# Patient Record
Sex: Female | Born: 1965 | Race: White | Hispanic: No | Marital: Married | Smoking: Former smoker
Health system: Southern US, Community
[De-identification: ages and names within clinical notes are randomized; demographics above are authoritative.]

## PROBLEM LIST (undated history)

## (undated) DIAGNOSIS — C73 Malignant neoplasm of thyroid gland: Secondary | ICD-10-CM

## (undated) DIAGNOSIS — J309 Allergic rhinitis, unspecified: Secondary | ICD-10-CM

## (undated) DIAGNOSIS — F411 Generalized anxiety disorder: Secondary | ICD-10-CM

## (undated) DIAGNOSIS — I10 Essential (primary) hypertension: Secondary | ICD-10-CM

## (undated) DIAGNOSIS — E039 Hypothyroidism, unspecified: Secondary | ICD-10-CM

## (undated) DIAGNOSIS — E785 Hyperlipidemia, unspecified: Secondary | ICD-10-CM

## (undated) DIAGNOSIS — D649 Anemia, unspecified: Secondary | ICD-10-CM

## (undated) DIAGNOSIS — F329 Major depressive disorder, single episode, unspecified: Secondary | ICD-10-CM

## (undated) DIAGNOSIS — D219 Benign neoplasm of connective and other soft tissue, unspecified: Secondary | ICD-10-CM

## (undated) DIAGNOSIS — N904 Leukoplakia of vulva: Secondary | ICD-10-CM

## (undated) DIAGNOSIS — F3289 Other specified depressive episodes: Secondary | ICD-10-CM

## (undated) HISTORY — DX: Hyperlipidemia, unspecified: E78.5

## (undated) HISTORY — PX: TUBAL LIGATION: SHX77

## (undated) HISTORY — DX: Essential (primary) hypertension: I10

## (undated) HISTORY — DX: Leukoplakia of vulva: N90.4

## (undated) HISTORY — DX: Generalized anxiety disorder: F41.1

## (undated) HISTORY — DX: Other specified depressive episodes: F32.89

## (undated) HISTORY — DX: Major depressive disorder, single episode, unspecified: F32.9

## (undated) HISTORY — DX: Allergic rhinitis, unspecified: J30.9

## (undated) HISTORY — PX: TOTAL THYROIDECTOMY: SHX2547

## (undated) HISTORY — PX: WISDOM TOOTH EXTRACTION: SHX21

---

## 1998-10-03 ENCOUNTER — Other Ambulatory Visit: Admission: RE | Admit: 1998-10-03 | Discharge: 1998-10-03 | Payer: Self-pay | Admitting: *Deleted

## 2001-05-05 ENCOUNTER — Inpatient Hospital Stay (HOSPITAL_COMMUNITY): Admission: AD | Admit: 2001-05-05 | Discharge: 2001-05-09 | Payer: Self-pay | Admitting: *Deleted

## 2001-05-05 ENCOUNTER — Encounter (INDEPENDENT_AMBULATORY_CARE_PROVIDER_SITE_OTHER): Payer: Self-pay

## 2001-05-11 ENCOUNTER — Inpatient Hospital Stay (HOSPITAL_COMMUNITY): Admission: AD | Admit: 2001-05-11 | Discharge: 2001-05-11 | Payer: Self-pay | Admitting: Obstetrics and Gynecology

## 2002-02-10 ENCOUNTER — Other Ambulatory Visit: Admission: RE | Admit: 2002-02-10 | Discharge: 2002-02-10 | Payer: Self-pay | Admitting: Obstetrics and Gynecology

## 2003-03-01 ENCOUNTER — Other Ambulatory Visit: Admission: RE | Admit: 2003-03-01 | Discharge: 2003-03-01 | Payer: Self-pay | Admitting: Obstetrics and Gynecology

## 2004-03-03 ENCOUNTER — Other Ambulatory Visit: Admission: RE | Admit: 2004-03-03 | Discharge: 2004-03-03 | Payer: Self-pay | Admitting: Obstetrics and Gynecology

## 2004-03-04 ENCOUNTER — Other Ambulatory Visit: Admission: RE | Admit: 2004-03-04 | Discharge: 2004-03-04 | Payer: Self-pay | Admitting: Obstetrics and Gynecology

## 2004-12-24 ENCOUNTER — Ambulatory Visit: Payer: Self-pay | Admitting: Internal Medicine

## 2005-04-16 ENCOUNTER — Other Ambulatory Visit: Admission: RE | Admit: 2005-04-16 | Discharge: 2005-04-16 | Payer: Self-pay | Admitting: Obstetrics and Gynecology

## 2005-08-25 ENCOUNTER — Other Ambulatory Visit: Admission: RE | Admit: 2005-08-25 | Discharge: 2005-08-25 | Payer: Self-pay | Admitting: Obstetrics and Gynecology

## 2006-03-10 ENCOUNTER — Ambulatory Visit: Payer: Self-pay | Admitting: Internal Medicine

## 2007-01-24 ENCOUNTER — Encounter: Admission: RE | Admit: 2007-01-24 | Discharge: 2007-01-24 | Payer: Self-pay | Admitting: Obstetrics and Gynecology

## 2007-03-10 ENCOUNTER — Ambulatory Visit: Payer: Self-pay | Admitting: Internal Medicine

## 2007-03-10 LAB — CONVERTED CEMR LAB
Albumin: 3.8 g/dL (ref 3.5–5.2)
BUN: 11 mg/dL (ref 6–23)
Bilirubin, Direct: 0.1 mg/dL (ref 0.0–0.3)
Calcium: 9.3 mg/dL (ref 8.4–10.5)
Chloride: 106 meq/L (ref 96–112)
Crystals: NEGATIVE
Direct LDL: 131 mg/dL
GFR calc Af Amer: 143 mL/min
Glucose, Bld: 94 mg/dL (ref 70–99)
HDL: 53.6 mg/dL (ref >39.0)
Lymphocytes Relative: 35 % (ref 12.0–46.0)
Monocytes Absolute: 0.6 10*3/uL (ref 0.2–0.7)
Monocytes Relative: 8.8 % (ref 3.0–11.0)
Neutrophils Relative %: 53.8 % (ref 43.0–77.0)
Nitrite: NEGATIVE
Potassium: 4.2 meq/L (ref 3.5–5.1)
RBC: 4.93 M/uL (ref 3.87–5.11)
RDW: 11.6 % (ref 11.5–14.6)
Sodium: 142 meq/L (ref 135–145)
Specific Gravity, Urine: 1.01 (ref 1.000–1.03)
Total Bilirubin: 0.7 mg/dL (ref 0.3–1.2)
Total CHOL/HDL Ratio: 3.8
Total Protein, Urine: NEGATIVE mg/dL
Urobilinogen, UA: 0.2 (ref 0.0–1.0)
VLDL: 16 mg/dL (ref 0–40)

## 2008-02-29 ENCOUNTER — Encounter: Admission: RE | Admit: 2008-02-29 | Discharge: 2008-02-29 | Payer: Self-pay | Admitting: Obstetrics and Gynecology

## 2008-03-07 ENCOUNTER — Encounter: Admission: RE | Admit: 2008-03-07 | Discharge: 2008-03-07 | Payer: Self-pay | Admitting: Obstetrics and Gynecology

## 2008-04-03 ENCOUNTER — Encounter: Payer: Self-pay | Admitting: Internal Medicine

## 2008-04-12 ENCOUNTER — Telehealth (INDEPENDENT_AMBULATORY_CARE_PROVIDER_SITE_OTHER): Payer: Self-pay | Admitting: *Deleted

## 2008-07-18 ENCOUNTER — Ambulatory Visit: Payer: Self-pay | Admitting: Internal Medicine

## 2008-07-18 DIAGNOSIS — J309 Allergic rhinitis, unspecified: Secondary | ICD-10-CM | POA: Insufficient documentation

## 2008-07-18 DIAGNOSIS — F329 Major depressive disorder, single episode, unspecified: Secondary | ICD-10-CM

## 2008-07-18 DIAGNOSIS — F3289 Other specified depressive episodes: Secondary | ICD-10-CM | POA: Insufficient documentation

## 2008-07-18 DIAGNOSIS — F411 Generalized anxiety disorder: Secondary | ICD-10-CM | POA: Insufficient documentation

## 2008-07-18 DIAGNOSIS — R109 Unspecified abdominal pain: Secondary | ICD-10-CM | POA: Insufficient documentation

## 2008-07-18 DIAGNOSIS — E785 Hyperlipidemia, unspecified: Secondary | ICD-10-CM

## 2008-07-18 LAB — CONVERTED CEMR LAB
ALT: 19 units/L (ref 0–35)
AST: 19 units/L (ref 0–37)
Albumin: 3.7 g/dL (ref 3.5–5.2)
Alkaline Phosphatase: 82 units/L (ref 39–117)
BUN: 9 mg/dL (ref 6–23)
Bacteria, UA: NEGATIVE
Basophils Absolute: 0 10*3/uL (ref 0.0–0.1)
Bilirubin, Direct: 0.1 mg/dL (ref 0.0–0.3)
Chloride: 105 meq/L (ref 96–112)
Eosinophils Absolute: 0.1 10*3/uL (ref 0.0–0.7)
H Pylori IgG: NEGATIVE
HCT: 43.5 % (ref 36.0–46.0)
Ketones, ur: NEGATIVE mg/dL
MCHC: 36 g/dL (ref 30.0–36.0)
Mucus, UA: NEGATIVE
Neutrophils Relative %: 67.5 % (ref 43.0–77.0)
Platelets: 399 10*3/uL (ref 150–400)
Potassium: 4.4 meq/L (ref 3.5–5.1)
RDW: 11.8 % (ref 11.5–14.6)
Sodium: 141 meq/L (ref 135–145)
Specific Gravity, Urine: 1.01 (ref 1.000–1.03)
Total Protein: 7 g/dL (ref 6.0–8.3)
Urine Glucose: NEGATIVE mg/dL
Urobilinogen, UA: 0.2 (ref 0.0–1.0)
pH: 6 (ref 5.0–8.0)

## 2008-07-30 ENCOUNTER — Encounter (INDEPENDENT_AMBULATORY_CARE_PROVIDER_SITE_OTHER): Payer: Self-pay | Admitting: *Deleted

## 2008-07-30 ENCOUNTER — Ambulatory Visit: Payer: Self-pay | Admitting: Internal Medicine

## 2008-07-30 DIAGNOSIS — R209 Unspecified disturbances of skin sensation: Secondary | ICD-10-CM | POA: Insufficient documentation

## 2008-08-08 ENCOUNTER — Encounter: Admission: RE | Admit: 2008-08-08 | Discharge: 2008-08-08 | Payer: Self-pay | Admitting: Internal Medicine

## 2008-08-21 ENCOUNTER — Telehealth (INDEPENDENT_AMBULATORY_CARE_PROVIDER_SITE_OTHER): Payer: Self-pay | Admitting: *Deleted

## 2008-08-21 DIAGNOSIS — IMO0002 Reserved for concepts with insufficient information to code with codable children: Secondary | ICD-10-CM | POA: Insufficient documentation

## 2008-08-24 ENCOUNTER — Encounter: Payer: Self-pay | Admitting: Internal Medicine

## 2008-08-24 ENCOUNTER — Ambulatory Visit: Payer: Self-pay | Admitting: Internal Medicine

## 2008-08-24 DIAGNOSIS — S6730XA Crushing injury of unspecified wrist, initial encounter: Secondary | ICD-10-CM | POA: Insufficient documentation

## 2008-08-24 DIAGNOSIS — S52539A Colles' fracture of unspecified radius, initial encounter for closed fracture: Secondary | ICD-10-CM | POA: Insufficient documentation

## 2008-08-28 ENCOUNTER — Encounter: Payer: Self-pay | Admitting: Internal Medicine

## 2008-08-29 ENCOUNTER — Encounter: Admission: RE | Admit: 2008-08-29 | Discharge: 2008-08-29 | Payer: Self-pay | Admitting: Internal Medicine

## 2008-08-31 ENCOUNTER — Telehealth (INDEPENDENT_AMBULATORY_CARE_PROVIDER_SITE_OTHER): Payer: Self-pay | Admitting: *Deleted

## 2008-09-03 ENCOUNTER — Telehealth (INDEPENDENT_AMBULATORY_CARE_PROVIDER_SITE_OTHER): Payer: Self-pay | Admitting: *Deleted

## 2008-09-11 ENCOUNTER — Encounter: Payer: Self-pay | Admitting: Internal Medicine

## 2008-10-25 ENCOUNTER — Encounter: Payer: Self-pay | Admitting: Internal Medicine

## 2009-03-04 ENCOUNTER — Encounter: Admission: RE | Admit: 2009-03-04 | Discharge: 2009-03-04 | Payer: Self-pay | Admitting: Obstetrics and Gynecology

## 2009-04-17 ENCOUNTER — Telehealth (INDEPENDENT_AMBULATORY_CARE_PROVIDER_SITE_OTHER): Payer: Self-pay | Admitting: *Deleted

## 2009-06-26 ENCOUNTER — Ambulatory Visit: Payer: Self-pay | Admitting: Internal Medicine

## 2009-06-26 DIAGNOSIS — M549 Dorsalgia, unspecified: Secondary | ICD-10-CM | POA: Insufficient documentation

## 2009-07-10 ENCOUNTER — Ambulatory Visit: Payer: Self-pay | Admitting: Internal Medicine

## 2009-07-10 ENCOUNTER — Telehealth: Payer: Self-pay | Admitting: Internal Medicine

## 2009-11-14 ENCOUNTER — Telehealth: Payer: Self-pay | Admitting: Internal Medicine

## 2010-01-16 ENCOUNTER — Ambulatory Visit: Payer: Self-pay | Admitting: Internal Medicine

## 2010-01-16 LAB — CONVERTED CEMR LAB
ALT: 18 units/L (ref 0–35)
Albumin: 3.8 g/dL (ref 3.5–5.2)
BUN: 9 mg/dL (ref 6–23)
Basophils Absolute: 0 10*3/uL (ref 0.0–0.1)
Eosinophils Absolute: 0.1 10*3/uL (ref 0.0–0.7)
Eosinophils Relative: 1.1 % (ref 0.0–5.0)
HCT: 44.1 % (ref 36.0–46.0)
Hemoglobin, Urine: NEGATIVE
Hemoglobin: 14.8 g/dL (ref 12.0–15.0)
LDL Cholesterol: 121 mg/dL — ABNORMAL HIGH (ref 0–99)
Lymphocytes Relative: 28.1 % (ref 12.0–46.0)
MCV: 96.2 fL (ref 78.0–100.0)
Nitrite: NEGATIVE
Potassium: 4.1 meq/L (ref 3.5–5.1)
RBC: 4.58 M/uL (ref 3.87–5.11)
RDW: 11.6 % (ref 11.5–14.6)
Sodium: 140 meq/L (ref 135–145)
Specific Gravity, Urine: 1.025 (ref 1.000–1.030)
Total Bilirubin: 0.4 mg/dL (ref 0.3–1.2)
Total CHOL/HDL Ratio: 3
Total Protein, Urine: NEGATIVE mg/dL
Total Protein: 7.7 g/dL (ref 6.0–8.3)
Triglycerides: 43 mg/dL (ref 0.0–149.0)
Urobilinogen, UA: 0.2 (ref 0.0–1.0)
WBC: 6.1 10*3/uL (ref 4.5–10.5)

## 2010-02-14 LAB — HM MAMMOGRAPHY: HM Mammogram: NORMAL

## 2010-03-11 ENCOUNTER — Encounter: Admission: RE | Admit: 2010-03-11 | Discharge: 2010-03-11 | Payer: Self-pay | Admitting: Obstetrics and Gynecology

## 2010-03-28 ENCOUNTER — Ambulatory Visit: Payer: Self-pay | Admitting: Internal Medicine

## 2010-06-12 ENCOUNTER — Telehealth: Payer: Self-pay | Admitting: Internal Medicine

## 2010-07-11 ENCOUNTER — Telehealth: Payer: Self-pay | Admitting: Internal Medicine

## 2010-09-22 ENCOUNTER — Telehealth: Payer: Self-pay | Admitting: Internal Medicine

## 2010-09-24 ENCOUNTER — Telehealth: Payer: Self-pay | Admitting: Internal Medicine

## 2010-11-06 ENCOUNTER — Ambulatory Visit: Payer: Self-pay | Admitting: Internal Medicine

## 2010-12-18 NOTE — Progress Notes (Signed)
Summary: med refill  Phone Note Refill Request Message from:  Fax from Pharmacy on September 22, 2010 2:47 PM  Refills Requested: Medication #1:  ALPRAZOLAM 0.25 MG  TABS 1 by mouth two times a day prn   Dosage confirmed as above?Dosage Confirmed   Last Refilled: 07/11/2010   Notes: [facility] [phone] Initial call taken by: Zella Ball Ewing CMA Duncan Dull),  September 22, 2010 2:48 PM    New/Updated Medications: ALPRAZOLAM 0.25 MG  TABS (ALPRAZOLAM) 1 by mouth two times a day as needed Prescriptions: ALPRAZOLAM 0.25 MG  TABS (ALPRAZOLAM) 1 by mouth two times a day as needed  #60 x 2   Entered and Authorized by:   Corwin Levins MD   Signed by:   Corwin Levins MD on 09/22/2010   Method used:   Print then Give to Patient   RxID:   [id number]  done hardcopy to LIM side B - dahlia  Corwin Levins MD  September 22, 2010 5:30 PM   Rx faxed to pharmacy Margaret Pyle, CMA  September 23, 2010 8:14 AM

## 2010-12-18 NOTE — Progress Notes (Signed)
  Phone Note Refill Request Message from:  Fax from Pharmacy on June 12, 2010 1:23 PM  Refills Requested: Medication #1:  HYDROCODONE-ACETAMINOPHEN 7.5-325 MG TABS 1po once daily as needed.   Dosage confirmed as above?Dosage Confirmed   Last Refilled: 03/27/2010   Notes: [facility], [phone] Initial call taken by: Robin Ewing CMA Duncan Dull),  June 12, 2010 1:25 PM  Follow-up for Phone Call        done hardcopy to LIM side B - dahlia  Follow-up by: Corwin Levins MD,  June 12, 2010 3:57 PM  Additional Follow-up for Phone Call Additional follow up Details #1::        Rx faxed to pharmacy Additional Follow-up by: Margaret Pyle, CMA,  June 12, 2010 4:05 PM    New/Updated Medications: HYDROCODONE-ACETAMINOPHEN 7.5-325 MG TABS (HYDROCODONE-ACETAMINOPHEN) 1po once daily as needed Prescriptions: HYDROCODONE-ACETAMINOPHEN 7.5-325 MG TABS (HYDROCODONE-ACETAMINOPHEN) 1po once daily as needed  #30 x 1   Entered and Authorized by:   Corwin Levins MD   Signed by:   Corwin Levins MD on 06/12/2010   Method used:   Print then Give to Patient   RxID:   [id number]

## 2010-12-18 NOTE — Progress Notes (Signed)
Summary: Hydrocodone  Phone Note Refill Request Message from:  Patient on September 24, 2010 11:34 AM  Refills Requested: Medication #1:  HYDROCODONE-ACETAMINOPHEN 7.5-325 MG TABS 1po once daily as needed.   Dosage confirmed as above?Dosage Confirmed   Supply Requested: 1 month To [facility]   Method Requested: Fax to Local Pharmacy Initial call taken by: Margaret Pyle, CMA,  September 24, 2010 11:34 AM    New/Updated Medications: HYDROCODONE-ACETAMINOPHEN 7.5-325 MG TABS (HYDROCODONE-ACETAMINOPHEN) 1po once daily as needed Prescriptions: HYDROCODONE-ACETAMINOPHEN 7.5-325 MG TABS (HYDROCODONE-ACETAMINOPHEN) 1po once daily as needed  #30 x 2   Entered and Authorized by:   Corwin Levins MD   Signed by:   Corwin Levins MD on 09/24/2010   Method used:   Print then Give to Patient   RxID:   [id number]  done hardcopy to LIM side B - dahlia Corwin Levins MD  September 24, 2010 4:59 PM   Rx faxed to pharmacy Margaret Pyle, CMA  September 25, 2010 8:03 AM

## 2010-12-18 NOTE — Progress Notes (Signed)
Summary: Rx refill req  Phone Note Call from Patient Call back at Home Phone [phone]   Caller: Patient [phone] Summary of Call: Pt called requesting refill of pain medication. Pt states that she had Rx filled at [facility] in [place] and but was told that she could not transfer refill to [facility]. This was verified by [facility]. Initial call taken by: Margaret Pyle, CMA,  July 11, 2010 11:54 AM  Follow-up for Phone Call        done hardcopy to LIM side B - dahlia  Follow-up by: Corwin Levins MD,  July 11, 2010 1:17 PM  Additional Follow-up for Phone Call Additional follow up Details #1::        Pt informed via VM, Rx faxed to pharmacy Additional Follow-up by: Margaret Pyle, CMA,  July 11, 2010 1:24 PM    Prescriptions: HYDROCODONE-ACETAMINOPHEN 7.5-325 MG TABS (HYDROCODONE-ACETAMINOPHEN) 1po once daily as needed  #30 x 1   Entered and Authorized by:   Corwin Levins MD   Signed by:   Corwin Levins MD on 07/11/2010   Method used:   Print then Give to Patient   RxID:   [id number]

## 2010-12-18 NOTE — Assessment & Plan Note (Signed)
Summary: CPX/ CK THYROID/ TIRED/NWS  #   Vital Signs:  Patient profile:   45 year old female Height:      64.5 inches Weight:      156 pounds BMI:     26.46 O2 Sat:      99 % on Room air Temp:     98.0 degrees F oral Pulse rate:   85 / minute Pulse rhythm:   regular BP sitting:   140 / 88  (left arm) Cuff size:   large  Vitals Entered By: Rock Nephew CMA (Mar 28, 2010 1:14 PM)  O2 Flow:  Room air  Preventive Care Screening  Last Tetanus Booster:    Date:  03/28/2010    Results:  Tdap  Mammogram:    Date:  02/14/2010    Results:  normal   Pap Smear:    Date:  12/17/2009    Results:  normal    History of Present Illness: overall doing well, cannot take oxycodone due to GI intolerance;  Pt denies CP, sob, doe, wheezing, orthopnea, pnd, worsening LE edema, palps, dizziness or syncope   Pt denies new neuro symptoms such as headache, facial or extremity weakness  Overal back pain stable wihtout increaed pain or LE pain, sweling or numbness.    Problems Prior to Update: 1)  Back Pain  (ICD-724.5) 2)  Closed Colles Fracture  (ICD-813.41) 3)  Crushing Injury, Wrist  (ICD-927.21) 4)  Lumbar Radiculopathy, Right  (ICD-724.4) 5)  Paresthesia  (ICD-782.0) 6)  Hyperlipidemia  (ICD-272.4) 7)  Allergic Rhinitis  (ICD-477.9) 8)  Depression  (ICD-311) 9)  Anxiety  (ICD-300.00) 10)  Abdominal Pain, Unspecified Site  (ICD-789.00)  Medications Prior to Update: 1)  Alprazolam 0.25 Mg  Tabs (Alprazolam) .Marland Kitchen.. 1 By Mouth Two Times A Day Prn 2)  Pantoprazole Sodium 40 Mg Tbec (Pantoprazole Sodium) .... Take 1 Tablet By Mouth Once A Day 3)  Darvocet-N 100 100-650 Mg Tabs (Propoxyphene N-Apap) .Marland Kitchen.. 1 By Mouth Q 6 Hrs As Needed 4)  Prednisone 1 Mg Tabs (Prednisone) .... Use Asd Per Er 5)  Oxycodone-Acetaminophen 5-325 Mg Tabs (Oxycodone-Acetaminophen) .Marland Kitchen.. 1 By Mouth Q 6 Hrs As Needed Pain 6)  Carisoprodol 350 Mg Tabs (Carisoprodol) .Marland Kitchen.. 1 By Mouth Q 6 Hrs As Needed  Current  Medications (verified): 1)  Alprazolam 0.25 Mg  Tabs (Alprazolam) .Marland Kitchen.. 1 By Mouth Two Times A Day Prn 2)  Omeprazole 20 Mg Cpdr (Omeprazole) .Marland Kitchen.. 1po Once Daily 3)  Carisoprodol 350 Mg Tabs (Carisoprodol) .Marland Kitchen.. 1 By Mouth Q 6 Hrs As Needed 4)  Hydrocodone-Acetaminophen 7.5-325 Mg Tabs (Hydrocodone-Acetaminophen) .Marland Kitchen.. 1po Once Daily As Needed  Allergies (verified): 1)  ! Sulfa 2)  * Oxycodone  Past History:  Past Medical History: Last updated: 07/18/2008 Anxiety Depression Allergic rhinitis Hyperlipidemia  Past Surgical History: Last updated: 07/18/2008 Denies surgical history  Family History: Last updated: 07/18/2008 HTN stroke grandmother with cancer  Social History: Last updated: 07/18/2008 Married 1 child Current Smoker Alcohol use-yes work - cust service  Risk Factors: Smoking Status: current (07/18/2008)  Review of Systems  The patient denies anorexia, fever, weight loss, weight gain, vision loss, decreased hearing, hoarseness, chest pain, syncope, dyspnea on exertion, peripheral edema, prolonged cough, headaches, hemoptysis, abdominal pain, melena, hematochezia, severe indigestion/heartburn, hematuria, muscle weakness, suspicious skin lesions, difficulty walking, depression, unusual weight change, abnormal bleeding, enlarged lymph nodes, angioedema, and breast masses.         all otherwise negative per pt -    Physical Exam  General:  alert and well-developed.   Head:  normocephalic and atraumatic.   Eyes:  vision grossly intact, pupils equal, and pupils round.   Ears:  R ear normal and L ear normal.   Nose:  no external deformity and no nasal discharge.   Mouth:  no gingival abnormalities and pharynx pink and moist.   Neck:  supple and no masses.   Lungs:  normal respiratory effort and normal breath sounds.   Heart:  normal rate and regular rhythm.   Abdomen:  soft, non-tender, and normal bowel sounds.   Msk:  no joint tenderness and no joint swelling.    Extremities:  no edema, no erythema  Neurologic:  cranial nerves II-XII intact and strength normal in all extremities.   Skin:  color normal and no rashes.   Psych:  not depressed appearing and moderately anxious.     Impression & Recommendations:  Problem # 1:  Preventive Health Care (ICD-V70.0) Overall doing well, age appropriate education and counseling updated and referral for appropriate preventive services done unless declined, immunizations up to date or declined, diet counseling done if overweight, urged to quit smoking if smokes , most recent labs reviewed and current ordered if appropriate, ecg reviewed or declined (interpretation per ECG scanned in the EMR if done); information regarding Medicare Prevention requirements given if appropriate; speciality referrals updated as appropriate   Problem # 2:  HYPERLIPIDEMIA (ICD-272.4) for diet, d/w pt, declines statin  Problem # 3:  LUMBAR RADICULOPATHY, RIGHT (ICD-724.4)  The following medications were removed from the medication list:    Darvocet-n 100 100-650 Mg Tabs (Propoxyphene n-apap) .Marland Kitchen... 1 by mouth q 6 hrs as needed    Oxycodone-acetaminophen 5-325 Mg Tabs (Oxycodone-acetaminophen) .Marland Kitchen... 1 by mouth q 6 hrs as needed pain Her updated medication list for this problem includes:    Carisoprodol 350 Mg Tabs (Carisoprodol) .Marland Kitchen... 1 by mouth q 6 hrs as needed    Hydrocodone-acetaminophen 7.5-325 Mg Tabs (Hydrocodone-acetaminophen) .Marland Kitchen... 1po once daily as needed stable overall by hx and exam, ok to continue meds/tx as is , but for change in med due to intoleracne  Complete Medication List: 1)  Alprazolam 0.25 Mg Tabs (Alprazolam) .Marland Kitchen.. 1 by mouth two times a day prn 2)  Omeprazole 20 Mg Cpdr (Omeprazole) .Marland Kitchen.. 1po once daily 3)  Carisoprodol 350 Mg Tabs (Carisoprodol) .Marland Kitchen.. 1 by mouth q 6 hrs as needed 4)  Hydrocodone-acetaminophen 7.5-325 Mg Tabs (Hydrocodone-acetaminophen) .Marland Kitchen.. 1po once daily as needed  Patient Instructions: 1)   you had the tetanus shot today 2)  start the prilosec 20 mg per day 3)  Continue all previous medications as before this visit , except the oxycodone was changed to the hydrocodone as needed  4)  Please schedule a follow-up appointment in 1 year or sooner if needed Prescriptions: OMEPRAZOLE 20 MG CPDR (OMEPRAZOLE) 1po once daily  #30 x 11   Entered and Authorized by:   Corwin Levins MD   Signed by:   Corwin Levins MD on 03/28/2010   Method used:   Print then Give to Patient   RxID:   [id number] HYDROCODONE-ACETAMINOPHEN 7.5-325 MG TABS (HYDROCODONE-ACETAMINOPHEN) 1po once daily as needed  #30 x 1   Entered and Authorized by:   Corwin Levins MD   Signed by:   Corwin Levins MD on 03/28/2010   Method used:   Print then Give to Patient   RxID:   [id number]   Preventive Care Screening  Last Tetanus Booster:  Date:  03/28/2010    Results:  Tdap  Mammogram:    Date:  02/14/2010    Results:  normal   Pap Smear:    Date:  12/17/2009    Results:  normal

## 2011-01-02 ENCOUNTER — Ambulatory Visit: Payer: Self-pay | Admitting: Internal Medicine

## 2011-01-02 ENCOUNTER — Ambulatory Visit (INDEPENDENT_AMBULATORY_CARE_PROVIDER_SITE_OTHER): Payer: Managed Care, Other (non HMO) | Admitting: Internal Medicine

## 2011-01-02 ENCOUNTER — Encounter: Payer: Self-pay | Admitting: Internal Medicine

## 2011-01-02 DIAGNOSIS — F3289 Other specified depressive episodes: Secondary | ICD-10-CM

## 2011-01-02 DIAGNOSIS — M549 Dorsalgia, unspecified: Secondary | ICD-10-CM

## 2011-01-02 DIAGNOSIS — F329 Major depressive disorder, single episode, unspecified: Secondary | ICD-10-CM

## 2011-01-02 DIAGNOSIS — J309 Allergic rhinitis, unspecified: Secondary | ICD-10-CM

## 2011-01-02 DIAGNOSIS — J019 Acute sinusitis, unspecified: Secondary | ICD-10-CM

## 2011-01-07 NOTE — Assessment & Plan Note (Signed)
Summary: puffy, raw eyes   Vital Signs:  Patient profile:   45 year old female Height:      61.5 inches Weight:      155 pounds BMI:     28.92 O2 Sat:      97 % on Room air Temp:     98.7 degrees F oral Pulse rate:   91 / minute BP sitting:   122 / 84  (left arm) Cuff size:   regular  Vitals Entered By: Zella Ball Ewing CMA (AAMA) (January 02, 2011 11:22 AM)  O2 Flow:  Room air CC: Both eyes puffy, pink, drainage/RE   CC:  Both eyes puffy, pink, and drainage/RE.  History of Present Illness: here with acute onset 2 wks gradually worsening facial pain, pressure, fever, greenish d/c, and now puffy eyes with discomfort to the lower lids;  has slight mild ST, without cough and Pt denies CP, worsening sob, doe, wheezing, orthopnea, pnd, worsening LE edema, palps, dizziness or syncope   Pt denies new neuro symptoms such as headache, facial or extremity weakness  Pt denies polydipsia, polyuria,  Overall good compliance with meds, trying to follow low chol  diet, wt stable, little excercise however  No recent wt loss, night sweats, loss of appetite or other constitutional symptoms Denies worsening depressive symptoms, suicidal ideation, or panic, though has ongoing anixety  but overall stable and not worsening.  Also stable mild possible nasal allergy congestion, for several months, as well chronic LBP without change recent worsening LE pain/weakness/numb, bowel or bladder change, fever, wt loss, fall, gait change.    Preventive Screening-Counseling & Management      Drug Use:  no.    Problems Prior to Update: 1)  Sinusitis- Acute-nos  (ICD-461.9) 2)  Preventive Health Care  (ICD-V70.0) 3)  Back Pain  (ICD-724.5) 4)  Closed Colles Fracture  (ICD-813.41) 5)  Crushing Injury, Wrist  (ICD-927.21) 6)  Lumbar Radiculopathy, Right  (ICD-724.4) 7)  Paresthesia  (ICD-782.0) 8)  Hyperlipidemia  (ICD-272.4) 9)  Allergic Rhinitis  (ICD-477.9) 10)  Depression  (ICD-311) 11)  Anxiety   (ICD-300.00) 12)  Abdominal Pain, Unspecified Site  (ICD-789.00)  Medications Prior to Update: 1)  Alprazolam 0.25 Mg  Tabs (Alprazolam) .Marland Kitchen.. 1 By Mouth Two Times A Day As Needed 2)  Omeprazole 20 Mg Cpdr (Omeprazole) .Marland Kitchen.. 1po Once Daily 3)  Carisoprodol 350 Mg Tabs (Carisoprodol) .Marland Kitchen.. 1 By Mouth Q 6 Hrs As Needed 4)  Hydrocodone-Acetaminophen 7.5-325 Mg Tabs (Hydrocodone-Acetaminophen) .Marland Kitchen.. 1po Once Daily As Needed  Current Medications (verified): 1)  Hydrocodone-Acetaminophen 7.5-325 Mg Tabs (Hydrocodone-Acetaminophen) .Marland Kitchen.. 1po Once Daily As Needed 2)  Levofloxacin 500 Mg Tabs (Levofloxacin) .Marland Kitchen.. 1 By Mouth Once Daily 3)  Allegra Otc .Marland Kitchen.. 1 By Mouth Once Daily As Needed  Allergies (verified): 1)  ! Sulfa 2)  * Oxycodone  Past History:  Past Medical History: Last updated: 07/18/2008 Anxiety Depression Allergic rhinitis Hyperlipidemia  Social History: Last updated: 01/02/2011 1 child Current Smoker Alcohol use-yes work - cust service Drug use-no Divorced  Risk Factors: Smoking Status: current (07/18/2008)  Past Surgical History: Denies surgical history Tubal ligation  Social History: 1 child Current Smoker Alcohol use-yes work - Biomedical engineer Drug use-no Divorced Drug Use:  no  Review of Systems       all otherwise negative per pt -    Physical Exam  General:  alert and well-developed.  , mild ill  Head:  normocephalic and atraumatic.   Eyes:  vision grossly intact, pupils  equal, and pupils round, conjucntiva clear but has bilat puffy swollen lids and maxilalry sinus area, with mild tender but no eyrthema , flucutuance, drainage.   Ears:  bilat TM's mild erythema, sinus mild tender bilat Nose:  nasal dischargemucosal pallor and mucosal edema.   Mouth:  pharyngeal erythema and fair dentition.   Neck:  supple and cervical lymphadenopathy.   Lungs:  normal respiratory effort and normal breath sounds.   Heart:  normal rate and regular rhythm.   Msk:  no  joint tenderness and no joint swelling.  , non spine tender Extremities:  no edema, no erythema  Neurologic:  strength normal in all extremities and gait normal.   Skin:  color normal and no rashes.   Psych:  not anxious appearing and not depressed appearing.     Impression & Recommendations:  Problem # 1:  SINUSITIS- ACUTE-NOS (ICD-461.9)  Her updated medication list for this problem includes:    Levofloxacin 500 Mg Tabs (Levofloxacin) .Marland Kitchen... 1 by mouth once daily treat as above, f/u any worsening signs or symptoms , as well as mucinex otc as needed   Instructed on treatment. Call if symptoms persist or worsen.   Problem # 2:  ALLERGIC RHINITIS (ICD-477.9)  for OTC allegra as needed - treat as above, f/u any worsening signs or symptoms   Discussed use of allergy medications and environmental measures.   Problem # 3:  BACK PAIN (ICD-724.5)  The following medications were removed from the medication list:    Carisoprodol 350 Mg Tabs (Carisoprodol) .Marland Kitchen... 1 by mouth q 6 hrs as needed Her updated medication list for this problem includes:    Hydrocodone-acetaminophen 7.5-325 Mg Tabs (Hydrocodone-acetaminophen) .Marland Kitchen... 1po once daily as needed stable overall by hx and exam, ok to continue meds/tx as is , med refills today, f/u any worsening symptoms  Problem # 4:  DEPRESSION (ICD-311)  The following medications were removed from the medication list:    Alprazolam 0.25 Mg Tabs (Alprazolam) .Marland Kitchen... 1 by mouth two times a day as needed stable overall by hx and exam, ok to continue meds/tx as is   Discussed treatment options, including trial of antidpressant medication.  Verified that the patient has no suicidal ideation at this time.   stable overall by hx and exam, ok to continue meds/tx as is   Complete Medication List: 1)  Hydrocodone-acetaminophen 7.5-325 Mg Tabs (Hydrocodone-acetaminophen) .Marland Kitchen.. 1po once daily as needed 2)  Levofloxacin 500 Mg Tabs (Levofloxacin) .Marland Kitchen.. 1 by mouth  once daily 3)  Allegra Otc  .Marland Kitchen.. 1 by mouth once daily as needed  Patient Instructions: 1)  Please take all new medications as prescribed 2)  Continue all previous medications as before this visit  3)  You can also use Mucinex OTC or it's generic for congestion  4)  You should consider allegra as well OTC  in case any of the swelling is allergic 5)  Please quit smoking 6)  Please schedule a follow-up appointment in 6 months for CPX with labs Prescriptions: LEVOFLOXACIN 500 MG TABS (LEVOFLOXACIN) 1 by mouth once daily  #10 x 0   Entered and Authorized by:   Corwin Levins MD   Signed by:   Corwin Levins MD on 01/02/2011   Method used:   Print then Give to Patient   RxID:   [id number] HYDROCODONE-ACETAMINOPHEN 7.5-325 MG TABS (HYDROCODONE-ACETAMINOPHEN) 1po once daily as needed  #30 x 3   Entered and Authorized by:   Corwin Levins MD  Signed by:   Corwin Levins MD on 01/02/2011   Method used:   Print then Give to Patient   RxID:   [id number]    Orders Added: 1)  Est. Patient Level IV [14782]

## 2011-03-17 ENCOUNTER — Encounter: Payer: Self-pay | Admitting: Internal Medicine

## 2011-03-17 DIAGNOSIS — Z0001 Encounter for general adult medical examination with abnormal findings: Secondary | ICD-10-CM | POA: Insufficient documentation

## 2011-03-17 DIAGNOSIS — Z0000 Encounter for general adult medical examination without abnormal findings: Secondary | ICD-10-CM | POA: Insufficient documentation

## 2011-03-18 ENCOUNTER — Encounter: Payer: Self-pay | Admitting: Internal Medicine

## 2011-03-18 ENCOUNTER — Ambulatory Visit (INDEPENDENT_AMBULATORY_CARE_PROVIDER_SITE_OTHER): Payer: Managed Care, Other (non HMO) | Admitting: Internal Medicine

## 2011-03-18 ENCOUNTER — Other Ambulatory Visit (INDEPENDENT_AMBULATORY_CARE_PROVIDER_SITE_OTHER): Payer: Managed Care, Other (non HMO)

## 2011-03-18 VITALS — BP 122/80 | HR 79 | Temp 98.6°F | Ht 64.0 in | Wt 153.8 lb

## 2011-03-18 DIAGNOSIS — Z0000 Encounter for general adult medical examination without abnormal findings: Secondary | ICD-10-CM

## 2011-03-18 DIAGNOSIS — J069 Acute upper respiratory infection, unspecified: Secondary | ICD-10-CM

## 2011-03-18 LAB — CBC WITH DIFFERENTIAL/PLATELET
Basophils Absolute: 0 10*3/uL (ref 0.0–0.1)
Basophils Relative: 0.3 % (ref 0.0–3.0)
Eosinophils Absolute: 0.1 10*3/uL (ref 0.0–0.7)
Eosinophils Relative: 1.2 % (ref 0.0–5.0)
Hemoglobin: 14.8 g/dL (ref 12.0–15.0)
MCHC: 34.9 g/dL (ref 30.0–36.0)
MCV: 94.3 fl (ref 78.0–100.0)
Monocytes Absolute: 0.6 10*3/uL (ref 0.1–1.0)
Monocytes Relative: 9.5 % (ref 3.0–12.0)
Neutro Abs: 3.9 10*3/uL (ref 1.4–7.7)
Neutrophils Relative %: 62.9 % (ref 43.0–77.0)
RBC: 4.5 Mil/uL (ref 3.87–5.11)
WBC: 6.2 10*3/uL (ref 4.5–10.5)

## 2011-03-18 LAB — BASIC METABOLIC PANEL
Chloride: 104 mEq/L (ref 96–112)
Creatinine, Ser: 0.7 mg/dL (ref 0.4–1.2)
GFR: 105.49 mL/min (ref >60.00)
Potassium: 4.6 mEq/L (ref 3.5–5.1)
Sodium: 137 mEq/L (ref 135–145)

## 2011-03-18 LAB — URINALYSIS, ROUTINE W REFLEX MICROSCOPIC
Bilirubin Urine: NEGATIVE
Hgb urine dipstick: NEGATIVE
Ketones, ur: NEGATIVE
Leukocytes, UA: NEGATIVE
Nitrite: NEGATIVE
pH: 6 (ref 5.0–8.0)

## 2011-03-18 LAB — LIPID PANEL
Cholesterol: 190 mg/dL (ref 0–200)
Total CHOL/HDL Ratio: 4
Triglycerides: 51 mg/dL (ref 0.0–149.0)

## 2011-03-18 LAB — HEPATIC FUNCTION PANEL: Bilirubin, Direct: 0 mg/dL (ref 0.0–0.3)

## 2011-03-18 NOTE — Assessment & Plan Note (Signed)

## 2011-03-18 NOTE — Progress Notes (Signed)
Subjective:    Patient ID: [name], female    DOB: 01/20/1966, 45 y.o.   MRN: [id number]  HPI  Here for wellness and f/u;  Overall doing ok;  Pt denies CP, worsening SOB, DOE, wheezing, orthopnea, PND, worsening LE edema, palpitations, dizziness or syncope.  Pt denies neurological change such as new Headache, facial or extremity weakness.  Pt denies polydipsia, polyuria, or low sugar symptoms. Pt states overall good compliance with treatment and medications, good tolerability, and trying to follow lower cholesterol diet.  Pt denies worsening depressive symptoms, suicidal ideation or panic. No fever, wt loss, night sweats, loss of appetite, or other constitutional symptoms.  Pt states good ability with ADL's, low fall risk, home safety reviewed and adequate, no significant changes in hearing or vision, and occasionally active with exercise.  Just had TSH done with gyn recently.  Sister with thyroid cancer, Still smoking - trying to slow down, about 1/2 ppd.  Has had about 1 wk signficant fatigue, mild throat discomfort and tender area to the right mid/upper ant neck area , without fever, HA, cough, blood, sob, wheezing.   Past Medical History  Diagnosis Date  . HYPERLIPIDEMIA 07/18/2008  . ANXIETY 07/18/2008  . DEPRESSION 07/18/2008  . ALLERGIC RHINITIS 07/18/2008  . LUMBAR RADICULOPATHY, RIGHT 08/21/2008  . BACK PAIN 06/26/2009   Past Surgical History  Procedure Date  . Tubal ligation     reports that she has been smoking.  She does not have any smokeless tobacco history on file. She reports that she drinks alcohol. She reports that she does not use illicit drugs. family history includes Cancer in her maternal grandmother; Hypertension in her other; and Stroke in her other. Allergies  Allergen Reactions  . Sulfonamide Derivatives    Current Outpatient Prescriptions on File Prior to Visit  Medication Sig Dispense Refill  . HYDROcodone-acetaminophen (NORCO) 7.5-325 MG per tablet Take 1 tablet by  mouth daily as needed.        Marland Kitchen Fexofenadine HCl (ALLEGRA PO) Take by mouth daily.        Marland Kitchen levofloxacin (LEVAQUIN) 500 MG tablet Take 500 mg by mouth daily.         Review of Systems Review of Systems  Constitutional: Negative for diaphoresis, activity change, appetite change and unexpected weight change.  HENT: Negative for hearing loss, ear pain, facial swelling, mouth sores and neck stiffness.   Eyes: Negative for pain, redness and visual disturbance.  Respiratory: Negative for shortness of breath and wheezing.   Cardiovascular: Negative for chest pain and palpitations.  Gastrointestinal: Negative for diarrhea, blood in stool, abdominal distention and rectal pain.  Genitourinary: Negative for hematuria, flank pain and decreased urine volume.  Musculoskeletal: Negative for myalgias and joint swelling.  Skin: Negative for color change and wound.  Neurological: Negative for syncope and numbness.  Hematological: Negative for adenopathy.  Psychiatric/Behavioral: Negative for hallucinations, self-injury, decreased concentration and agitation.      Objective:   Physical Exam BP 122/80  Pulse 79  Temp(Src) 98.6 F (37 C) (Oral)  Ht 5\' 4"  (1.626 m)  Wt 153 lb 12 oz (69.741 kg)  BMI 26.39 kg/m2  SpO2 99%  LMP 02/24/2011 Physical Exam  VS noted Constitutional: Pt is oriented to person, place, and time. Appears well-developed and well-nourished.  HENT:  Head: Normocephalic and atraumatic.  Right Ear: External ear normal.  Left Ear: External ear normal.  Bilat tm's mild erythema.  Sinus nontender.  Pharynx mild erythema Neck with 1 palpable  mild tender, mobile, firm but not hard LN approx pea sized to the right pre-mid SCM, no other submandib or other neck LA or mass noted, thyroid without enlargement or mass Nose: Nose normal.  Mouth/Throat: Oropharynx is clear and moist.  Eyes: Conjunctivae and EOM are normal. Pupils are equal, round, and reactive to light.  Neck: Normal range of  motion. Neck supple. No JVD present. No tracheal deviation present.  Cardiovascular: Normal rate, regular rhythm, normal heart sounds and intact distal pulses.   Pulmonary/Chest: Effort normal and breath sounds normal.  Abdominal: Soft. Bowel sounds are normal. There is no tenderness.  Musculoskeletal: Normal range of motion. Exhibits no edema.  Lymphadenopathy:  Has no cervical adenopathy.  Neurological: Pt is alert and oriented to person, place, and time. Pt has normal reflexes. No cranial nerve deficit.  Skin: Skin is warm and dry. No rash noted.  Psychiatric:  Has  normal mood and affect. Behavior is normal.         Assessment & Plan:

## 2011-03-18 NOTE — Patient Instructions (Signed)
Continue all other medications as before Please go to LAB in the Basement for the blood and/or urine tests to be done today Please call the number on the Blue Card (the [employer] [facility]) for results of testing in 2-3 days Please return in 1 year for your yearly visit, or sooner if needed, with Lab testing done 3-5 days before  

## 2011-03-18 NOTE — Assessment & Plan Note (Signed)
Mild , likely viral, with tonsilary hypertrophy and one pea sized LN to the right ant SCM chain, tender , mobile- ok to follow, reassured,  to f/u any worsening symptoms or concerns

## 2011-03-19 ENCOUNTER — Telehealth: Payer: Self-pay

## 2011-03-19 MED ORDER — AZITHROMYCIN 250 MG PO TABS: ORAL_TABLET | ORAL | Status: AC

## 2011-03-19 NOTE — Telephone Encounter (Signed)
Ok for zpack x 1 - done per [employer]

## 2011-03-19 NOTE — Telephone Encounter (Signed)
Pt advised.

## 2011-03-19 NOTE — Telephone Encounter (Signed)
Pt called stating she now has "white pocket" on both tonsils. Pt is requesting and ABX to treat, please advise.

## 2011-04-03 NOTE — Discharge Summary (Signed)
[facility]  Patient:    [name], [name]                       MRN: [id number] Adm. Date:  [id number] Disc. Date: [id number] Attending:  Osborn Coho Dictator:   Leilani Able, P.A.                           Discharge Summary  FINAL DIAGNOSES:              Intrauterine pregnancy at 34 6/[redacted] weeks gestation, failure to progress, pregnancy induced hypertension, positive group B strep status, uterine fibroids, desire for permanent sterilization.  PROCEDURE:                    Primary low segment transverse cesarean section, bilateral tubal ligation.  SURGEON:                      Brook A. Edward Jolly, M.D.  COMPLICATIONS:                None.  HISTORY:                      This 45 year old G1, P0 was admitted at 51 ______  weeks gestation for induction secondary to pregnancy induced hypertension.  There was no evidence of proteinuria and the patients cervix was only fingertip dilated and 30% effaced.  Patient had had a reactive nonstress test performed in the office.  She was admitted and received two doses of Cytotec intravaginally.  The following morning she was started on Pitocin.  The patient was started on penicillin for group B strep prophylaxis. The patient progressed and had spontaneous rupture of membranes.  Her cervix progressed to about 6 cm dilated which at that point intrauterine pressure catheters and fetal scalp electrodes were placed.  The patient did achieve a good labor pattern but the cervix remained at about 6-7 cm dilated and a swollen anterior cervical lip.  At this point a diagnosis of arrest of dilation was made and a recommendation was made to proceed with a cesarean section.  Patient was taken to the operating room by Dr. Conley Simmonds where a primary low transverse cesarean section was performed with the delivery of a 7 pound 0 ounce female infant with Apgars of 9 and 9.  Patient still expressed her desires for permanent  sterilization and after delivery had a bilateral tubal ligation performed.  The delivery and the tubal went without complications. Patients postoperative course was benign without significant fevers.  Patient was felt ready for discharge on postoperative day #3.  She was sent home on a regular diet.  Told to decrease activities.  Told to continue prenatal vitamins.  Was given Tylox one to two q.4h. p.r.n. for pain.  Told she could use over-the-counter medicines.  Was to follow-up in the office in four weeks.  DISCHARGE LABORATORIES:       Hemoglobin 11.5, white blood cell count 17.9. DD:  06/01/01 TD:  06/01/01 Job: [id number]

## 2011-04-03 NOTE — Op Note (Signed)
[facility]  Patient:    [name], [name]                       MRN: [id number] Proc. Date: 05/06/01 Adm. Date:  [id number] Attending:  Miguel Aschoff                           Operative Report  PREOPERATIVE DIAGNOSES:       1. Intrauterine pregnancy at 37+[redacted] weeks                                  gestation.                               2. Failure to progress.                               3. Pregnancy induced hypertension.                               4. Positive group B streptococcus status.                               5. Uterine leiomyoma.                               6. Desire for permanent sterilization.  POSTOPERATIVE DIAGNOSES:      1. Intrauterine pregnancy at 37+[redacted] weeks                                  gestation.                               2. Failure to progress.                               3. Pregnancy induced hypertension.                               4. Positive group B streptococcus status.                               5. Uterine leiomyoma.                               6. Desire for permanent sterilization.  PROCEDURE:                    Primary low segment transverse cesarean section                               and bilateral tubal ligation.  SURGEON:  Brook A. Edward Jolly, M.D.  ANESTHESIA:                   Epidural.  INTRAVENOUS FLUIDS:           1800 cc Ringers lactate.  ESTIMATED BLOOD LOSS:         700 cc.  URINE OUTPUT:                 125 cc.  COMPLICATIONS:                None.  INDICATIONS:                  The patient was a 45 year old, gravida 1, para 0, admitted at 37+[redacted] weeks gestation for induction of labor due to pregnancy induced hypertension with blood pressures in the office which were in the range of 142-150/92-100. There was no evidence of proteinuria. The patients cervix was noted to be fingertip dilated and 30% effaced. The patient had a reactive nonstress test. She was admitted for  induction of labor and received two doses of Cytotec intravaginally. The following morning on May 06, 2001, the patient began induction of labor with Pitocin. When the patient entered labor, penicillin was begun for group B strep prophylaxis. The patient progressed throughout the latent phase of labor and had spontaneous rupture of membranes. Her cervix progressed to 6 cm at which time an intrauterine pressure catheter and fetal scalp electrode for improvement of both uterine and fetal monitoring. The patient did achieve adequate [place] units, however, the cervix remained at 6-7 cm dilated and with a swollen anterior cervical lip and a vertex remaining at the -1 station. The fetal heart rate tracing remained reassuring.  A diagnosis was held with the patient regarding her diagnosis of arrest of dilatation. A recommendation was made to proceed with a primary low segment transverse cesarean section. The patient had expressed a desire for permanent sterilization, and a decision was made to proceed with a primary cesarean delivery and a bilateral tubal ligation after the risks and benefits were reviewed with her.  The patient was quoted a failure rate of approximately 1 in 250 to 1 in 300 of the tubal ligation which may result in either an intrauterine or ectopic pregnancy.  FINDINGS:                     A viable female was delivered at 2048 with Apgars of 9 at one minute and 9 at five minutes. Clear amniotic fluid was noted. The placenta had a succinturiate lobe with a normal insertion of a three-vessel cord. There was a 3 cm subserosal fibroid of the anterior fundus. The tubes and ovaries were normal.  SPECIMEN:                     A portion of the right and left fallopian tubes were sent to pathology.  DESCRIPTION OF PROCEDURE:     With an IV and an epidural and Foley catheter previously placed, the patient was taken to the operating room after she was properly identified. The  patient was placed in the supine position, and her epidural was dosed for surgical anesthesia. The abdomen was sterilely prepped and draped. Adequate anesthesia was obtained.  A Pfannenstiel incision was created sharply with a scalpel, it was carried down to the rectus fascia using the same. The fascia was then scored in the midline, and the incision was carried out bilaterally using a  Mayo scissors. The fascia was separated from the underlying rectus muscles using sharp dissection with a Mayo scissors both superiorly and inferiorly. The rectus muscles were then divided sharply in the midline, and the parietal peritoneum was identified and grasped with two snap clamps. It was entered sharply with the Metzenbaum scissors. The incision was extended cranially and caudally with care taken to avoid cystotomy. A Doyen retractor was placed over the bladder to expose the lower uterine segment. A bladder flap was created sharply with the Metzenbaum scissors, and the Doyen retractor was replaced over this. A transverse lower uterine segment incision was created sharply with the scalpel, and the incision was carried out bilaterally with a bandage scissors. A hand was inserted through the uterine incision, and the fetal vertex was delivered without difficulty. The nares and mouth were suctioned. The remainder of the infant was then delivered, and the cord was doubly clamped and cut. The newborn was vigorous and was carried over to the awaiting pediatricians.  Cord blood was obtained, and the placenta was manually extracted. The patient received Pitocin 20 units IV, and she did receive Cefotetan 1 g IV at the time of cord clamp. The uterine cavity was wiped with a moistened lap pad to assure that there were no remaining membranes or portions of the placenta, and there were none. The uterus was exteriorized at this point for closure of the uterine incision. The closure of the incision was performed  with a running lock suture of #1 chromic.  At this time, the tubal ligation was performed. The left fallopian tube was grasped with the Babcock clamp and followed to its fimbriated end. A knuckle  of tissue was created, and a free tie of 0 plain was placed at the base of the knuckle of tissue. A snap clamp was brought through the mesosalpinx. A free tie of 2-0 plain was then placed at the base of each knuckle of tissue, and the intervening portion was excised and sent to pathology. Hemostasis was excellent. The same procedure that was performed on the patients left hand side was repeated on the right side. After that fallopian tube was followed all the way to its fimbriated end.  The uterus was returned to the peritoneal cavity which was irrigated and suctioned with crystalloid solution. The incision was noted to be hemostatic. The parietal peritoneum was closed with a running suture of 2-0 Vicryl. The fascia was closed with a running suture of 0 Vicryl. The subcutaneous tissue was irrigated with crystalloid solution and suction. Hemostasis was achieved with monopolar cautery.  The skin was closed with staples and a sterile bandage was placed over this. The uterus was expressed of any remaining clots. the patient was cleansed of any remaining Betadine, and she was taken to the recovery room in stable condition. There were no complications to the procedure. All needle, instrument, and sponge counts were correct. DD:  05/06/01 TD:  05/08/01 Job: [id number]

## 2011-05-12 ENCOUNTER — Encounter: Payer: Self-pay | Admitting: Internal Medicine

## 2011-05-12 ENCOUNTER — Ambulatory Visit (INDEPENDENT_AMBULATORY_CARE_PROVIDER_SITE_OTHER): Payer: Managed Care, Other (non HMO) | Admitting: Internal Medicine

## 2011-05-12 VITALS — BP 110/78 | HR 90 | Temp 98.4°F | Ht 64.0 in | Wt 153.0 lb

## 2011-05-12 DIAGNOSIS — F411 Generalized anxiety disorder: Secondary | ICD-10-CM

## 2011-05-12 DIAGNOSIS — IMO0001 Reserved for inherently not codable concepts without codable children: Secondary | ICD-10-CM

## 2011-05-12 DIAGNOSIS — W57XXXA Bitten or stung by nonvenomous insect and other nonvenomous arthropods, initial encounter: Secondary | ICD-10-CM | POA: Insufficient documentation

## 2011-05-12 DIAGNOSIS — T148XXA Other injury of unspecified body region, initial encounter: Secondary | ICD-10-CM

## 2011-05-12 DIAGNOSIS — J309 Allergic rhinitis, unspecified: Secondary | ICD-10-CM

## 2011-05-12 MED ORDER — DOXYCYCLINE HYCLATE 100 MG PO TABS: 100.0000 mg | ORAL_TABLET | Freq: Two times a day (BID) | ORAL | Status: AC

## 2011-05-12 NOTE — Assessment & Plan Note (Signed)
Mild to mod, for antibx course,  to f/u any worsening symptoms or concerns 

## 2011-05-12 NOTE — Patient Instructions (Signed)
Take all new medications as prescribed Continue all other medications as before  

## 2011-05-12 NOTE — Assessment & Plan Note (Signed)
stable overall by hx and exam, most recent data reviewed with pt, and pt to continue medical treatment as before  Lab Results  Component Value Date   WBC 6.2 03/18/2011   HGB 14.8 03/18/2011   HCT 42.4 03/18/2011   PLT 262.0 03/18/2011   CHOL 190 03/18/2011   TRIG 51.0 03/18/2011   HDL 51.20 03/18/2011   LDLDIRECT 131.0 03/10/2007   ALT 15 03/18/2011   AST 15 03/18/2011   NA 137 03/18/2011   K 4.6 03/18/2011   CL 104 03/18/2011   CREATININE 0.7 03/18/2011   BUN 13 03/18/2011   CO2 26 03/18/2011   TSH 1.83 01/16/2010

## 2011-05-12 NOTE — Assessment & Plan Note (Signed)
stable overall by hx and exam, and pt to continue medical treatment as before 

## 2011-05-12 NOTE — Progress Notes (Signed)
  Subjective:    Patient ID: [name], female    DOB: 1966/06/12, 45 y.o.   MRN: [id number]  HPI  Here with recent tick bite noted to left lateral thigh several days, initially itched and she has vigorously scratched and has some bruising about the site , but also today with onset mild red, tender, swelling area, without f/c, n/v, other rash, or other symtpoms.  Does have several wks ongoing nasal allergy symptoms with clear congestion, itch and sneeze, without fever, pain, ST, cough or wheezing - but overall ok with OTC meds, which has helped some tick bite itching as well.  Denies worsening depressive symptoms, suicidal ideation, or panic, though has ongoing anxiety, not increased recently.  Past Medical History  Diagnosis Date  . HYPERLIPIDEMIA 07/18/2008  . ANXIETY 07/18/2008  . DEPRESSION 07/18/2008  . ALLERGIC RHINITIS 07/18/2008  . LUMBAR RADICULOPATHY, RIGHT 08/21/2008  . BACK PAIN 06/26/2009   Past Surgical History  Procedure Date  . Tubal ligation     reports that she has been smoking.  She does not have any smokeless tobacco history on file. She reports that she drinks alcohol. She reports that she does not use illicit drugs. family history includes Cancer in her maternal grandmother; Hypertension in her other; and Stroke in her other. Allergies  Allergen Reactions  . Sulfonamide Derivatives    Current Outpatient Prescriptions on File Prior to Visit  Medication Sig Dispense Refill  . HYDROcodone-acetaminophen (NORCO) 7.5-325 MG per tablet Take 1 tablet by mouth daily as needed.         Review of Systems Review of Systems  Constitutional: Negative for diaphoresis and unexpected weight change.  HENT: Negative for drooling and tinnitus.   Eyes: Negative for photophobia and visual disturbance.  Respiratory: Negative for choking and stridor.   Gastrointestinal: Negative for vomiting and blood in stool.      Objective:   Physical Exam BP 110/78  Pulse 90  Temp(Src) 98.4 F  (36.9 C) (Oral)  Ht 5\' 4"  (1.626 m)  Wt 153 lb (69.4 kg)  BMI 26.26 kg/m2  SpO2 97%  LMP 04/05/2011 Physical Exam  VS noted Constitutional: Pt appears well-developed and well-nourished.  HENT: Head: Normocephalic.  Right Ear: External ear normal.  Left Ear: External ear normal.  Eyes: Conjunctivae and EOM are normal. Pupils are equal, round, and reactive to light.  Neck: Normal range of motion. Neck supple.  Cardiovascular: Normal rate and regular rhythm.   Pulmonary/Chest: Effort normal and breath sounds normal.  Abd:  Soft, NT, non-distended, + BS Neurological: Pt is alert. No cranial nerve deficit.  Skin: Skin is warm. No erythema. except for left mid lateral thigh with 1 cm area with induration about a central insect bite site, no fluctuance or drainage, no red streaks or new groin LA Psychiatric: Pt behavior is normal. Thought content normal.         Assessment & Plan:

## 2011-06-10 ENCOUNTER — Ambulatory Visit (INDEPENDENT_AMBULATORY_CARE_PROVIDER_SITE_OTHER): Payer: Managed Care, Other (non HMO) | Admitting: Internal Medicine

## 2011-06-10 ENCOUNTER — Encounter: Payer: Self-pay | Admitting: Internal Medicine

## 2011-06-10 VITALS — BP 120/82 | HR 83 | Temp 98.3°F | Ht 61.5 in

## 2011-06-10 DIAGNOSIS — Z720 Tobacco use: Secondary | ICD-10-CM

## 2011-06-10 DIAGNOSIS — F17200 Nicotine dependence, unspecified, uncomplicated: Secondary | ICD-10-CM

## 2011-06-10 DIAGNOSIS — I889 Nonspecific lymphadenitis, unspecified: Secondary | ICD-10-CM

## 2011-06-10 MED ORDER — AMOXICILLIN-POT CLAVULANATE 875-125 MG PO TABS: 1.0000 | ORAL_TABLET | Freq: Two times a day (BID) | ORAL | Status: AC

## 2011-06-10 NOTE — Patient Instructions (Addendum)
It was good to see you today. Augmentin antibiotics for lymph node infection - Your prescription(s) have been submitted to your pharmacy. Please take as directed and contact our office if you believe you are having problem(s) with the medication(s). If not improved swelling after 1 week antibiotics, let us know for additional testing as needed Work on giving up those cigarettes! You can do it - Quitting now will reduce your risk of cancer!!

## 2011-06-10 NOTE — Progress Notes (Signed)
  Subjective:    Patient ID: [name], female    DOB: 09-05-1966, 45 y.o.   MRN: [id number]  HPI complains of "lump" opver R lower jaw Onset 3 weeks ago - progressive symptoms - began as "tingle" in R lower jaw - progressed to tenderness, then swollen area last 72h No fever, no tooth pain - no TMJ pain with eating/chewing Saw dentist today - xrays "teeth ok, no other problems" No ear pain - but +insect bite and prior R ear pain 1 month ago No rash, no swelling in neck  Past Medical History  Diagnosis Date  . HYPERLIPIDEMIA   . DEPRESSION   . ANXIETY   . ALLERGIC RHINITIS      Review of Systems  Constitutional: Negative for chills and fatigue.  HENT: Negative for ear pain, sore throat, mouth sores, trouble swallowing, dental problem, sinus pressure and tinnitus.   Respiratory: Negative for shortness of breath.   Neurological: Negative for headaches.       Objective:   Physical Exam BP 120/82  Pulse 83  Temp(Src) 98.3 F (36.8 C) (Oral)  Ht 5' 1.5" (1.562 m)  SpO2 97%  LMP 04/05/2011 Constitutional: She is oriented to person, place, and time. She appears well-developed and well-nourished. No distress. nontoxic HENT: Head: Normocephalic and atraumatic - soft tissue swelling 2cm x 1.2cm R lateral side of lower jaw not involving curve of mandible, min tender and not fluctuant - mild abn warmth.. Ears; B TMs ok, no erythema or effusion; Nose: Nose normal.  Mouth/Throat: Oropharynx is clear and moist. No oropharyngeal exudate. teeth without obvious abnormality - gums/tongue without lesion Eyes: Conjunctivae and EOM are normal. Pupils are equal, round, and reactive to light. No scleral icterus.  Neck: Normal range of motion. Neck supple. No JVD present. No thyromegaly present. no LAD Cardiovascular: Normal rate, regular rhythm and normal heart sounds.  No murmur heard. No BLE edema. Pulmonary/Chest: Effort normal and breath sounds normal. No respiratory distress. She has no  wheezes.   Skin: Skin is warm and dry. No rash noted. No erythema.  Psychiatric: She has a normal mood and affect. Her behavior is normal. Judgment and thought content normal.   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 Seen by dentist today (dr.edward scott) and reports panorex films negative for dental abn - consider additional imaging if not improved with antibiotics course - pt understands same and will let us know

## 2011-06-11 ENCOUNTER — Ambulatory Visit: Payer: Managed Care, Other (non HMO) | Admitting: Internal Medicine

## 2011-06-12 ENCOUNTER — Ambulatory Visit: Payer: Managed Care, Other (non HMO) | Admitting: Internal Medicine

## 2011-06-26 ENCOUNTER — Ambulatory Visit (INDEPENDENT_AMBULATORY_CARE_PROVIDER_SITE_OTHER): Payer: Managed Care, Other (non HMO) | Admitting: Internal Medicine

## 2011-06-26 ENCOUNTER — Ambulatory Visit (INDEPENDENT_AMBULATORY_CARE_PROVIDER_SITE_OTHER)
Admission: RE | Admit: 2011-06-26 | Discharge: 2011-06-26 | Disposition: A | Discharge status: Home / Self Care | Payer: Managed Care, Other (non HMO) | Source: Ambulatory Visit | Attending: Cardiology | Admitting: Cardiology

## 2011-06-26 ENCOUNTER — Telehealth: Payer: Self-pay | Admitting: Internal Medicine

## 2011-06-26 ENCOUNTER — Ambulatory Visit (INDEPENDENT_AMBULATORY_CARE_PROVIDER_SITE_OTHER)
Admission: RE | Admit: 2011-06-26 | Discharge: 2011-06-26 | Disposition: A | Discharge status: Home / Self Care | Payer: Managed Care, Other (non HMO) | Source: Ambulatory Visit | Attending: Internal Medicine | Admitting: Internal Medicine

## 2011-06-26 ENCOUNTER — Encounter: Payer: Self-pay | Admitting: Internal Medicine

## 2011-06-26 VITALS — BP 122/80 | HR 76 | Temp 98.5°F | Ht 64.0 in | Wt 151.5 lb

## 2011-06-26 DIAGNOSIS — R220 Localized swelling, mass and lump, head: Secondary | ICD-10-CM

## 2011-06-26 DIAGNOSIS — F329 Major depressive disorder, single episode, unspecified: Secondary | ICD-10-CM

## 2011-06-26 DIAGNOSIS — E041 Nontoxic single thyroid nodule: Secondary | ICD-10-CM

## 2011-06-26 DIAGNOSIS — R221 Localized swelling, mass and lump, neck: Secondary | ICD-10-CM

## 2011-06-26 DIAGNOSIS — F411 Generalized anxiety disorder: Secondary | ICD-10-CM

## 2011-06-26 MED ORDER — FLUCONAZOLE 150 MG PO TABS: ORAL_TABLET | ORAL | Status: AC

## 2011-06-26 MED ORDER — IOHEXOL 300 MG/ML  SOLN
80.0000 mL | Freq: Once | INTRAMUSCULAR | Status: AC | PRN
  Administered 2011-06-26: 80 mL via INTRAVENOUS

## 2011-06-26 MED ORDER — HYDROCODONE-ACETAMINOPHEN 7.5-325 MG PO TABS: 1.0000 | ORAL_TABLET | Freq: Every day | ORAL | Status: DC | PRN

## 2011-06-26 NOTE — Telephone Encounter (Signed)
Spoke to pt , relayed information about CT, and she is aware of appt to f/u with Dr Everardo All

## 2011-06-26 NOTE — Telephone Encounter (Signed)
I was unable to speak to pt directly by phone at work number, home, or cell phone; I did leave message on cell phone, though I stated this is not normally what we do ;  I also LMOPT as well  Please inform pt that she has "muscle swelling only" that should improve eventually, that accounts for the swelling to the right face and neck  INCIDENTALLY, she was found to have left thyroid nodule and adjacent lymph node that are somewhat calcified, and somewhat concerning, will need to see Dr Everardo All to r/o thyroid cancer

## 2011-06-26 NOTE — Progress Notes (Signed)
Quick Note:  Voice message left on [employer] system - lab is negative, normal or otherwise stable, pt to continue same tx ______ 

## 2011-06-26 NOTE — Patient Instructions (Addendum)
Take all new medications as prescribed - the yeast medication Continue all other medications as before You will be contacted regarding the referral for: CT scans, and ENT Please stop smoking Please go to XRAY in the Basement for the x-ray test Please call the phone number [phone] (the [employer] System) for results of testing in 2-3 days;  When calling, simply dial the number, and when prompted enter the MRN number above (the Medical Record Number) and the # key, then the message should start.

## 2011-06-27 ENCOUNTER — Encounter: Payer: Self-pay | Admitting: Internal Medicine

## 2011-06-27 NOTE — Assessment & Plan Note (Signed)
stable overall by hx and exam, most recent data reviewed with pt, and pt to continue medical treatment as before  Lab Results  Component Value Date   WBC 6.2 03/18/2011   HGB 14.8 03/18/2011   HCT 42.4 03/18/2011   PLT 262.0 03/18/2011   CHOL 190 03/18/2011   TRIG 51.0 03/18/2011   HDL 51.20 03/18/2011   LDLDIRECT 131.0 03/10/2007   ALT 15 03/18/2011   AST 15 03/18/2011   NA 137 03/18/2011   K 4.6 03/18/2011   CL 104 03/18/2011   CREATININE 0.7 03/18/2011   BUN 13 03/18/2011   CO2 26 03/18/2011   TSH 1.83 01/16/2010

## 2011-06-27 NOTE — Assessment & Plan Note (Signed)
stable overall by hx and exam, and pt to continue medical treatment as before 

## 2011-06-27 NOTE — Progress Notes (Signed)
  Subjective:    Patient ID: [name], female    DOB: 1966-08-09, 45 y.o.   MRN: [id number]  HPI Here for f/u;  Has persistent swelling of the right face near the angle of the jaw despite recent antibx trial.  No fever, pain, and  Pt denies fever, wt loss, night sweats, loss of appetite, or other constitutional symptoms  Pt denies chest pain, increased sob or doe, wheezing, orthopnea, PND, increased LE swelling, palpitations, dizziness or syncope.  Pt denies new neurological symptoms such as new headache, or facial or extremity weakness or numbness   Pt denies polydipsia, polyuria. Still smoking, not really ready to quit. Denies worsening depressive symptoms, suicidal ideation, or panic. Does have vaginal yeast symptoms after recent antibx with itching and d/c. Past Medical History  Diagnosis Date  . HYPERLIPIDEMIA   . DEPRESSION   . ANXIETY   . ALLERGIC RHINITIS    Past Surgical History  Procedure Date  . Tubal ligation     reports that she has been smoking.  She does not have any smokeless tobacco history on file. She reports that she drinks alcohol. She reports that she does not use illicit drugs. family history includes Cancer in her maternal grandmother; Hypertension in her other; and Stroke in her other. Allergies  Allergen Reactions  . Doxycycline   . Sulfonamide Derivatives    No current outpatient prescriptions on file prior to visit.   Review of Systems Review of Systems  Constitutional: Negative for diaphoresis and unexpected weight change.  HENT: Negative for drooling and tinnitus.   Eyes: Negative for photophobia and visual disturbance.  Respiratory: Negative for choking and stridor.   Gastrointestinal: Negative for vomiting and blood in stool. .       Objective:   Physical Exam BP 122/80  Pulse 76  Temp(Src) 98.5 F (36.9 C) (Oral)  Ht 5\' 4"  (1.626 m)  Wt 151 lb 8 oz (68.72 kg)  BMI 26.00 kg/m2  SpO2 97%  LMP 06/06/2011 Physical Exam  VS  noted Constitutional: Pt appears well-developed and well-nourished.  HENT: Head: Normocephalic. Right angle of jaw with nontender nondiscrete mild swelling without specific mass, but seems to extend to behind the angle of the jaw and right submandibular as well Right Ear: External ear normal.  Left Ear: External ear normal.  Eyes: Conjunctivae and EOM are normal. Pupils are equal, round, and reactive to light.  Neck: Normal range of motion. Neck supple. No other palpable lesions Cardiovascular: Normal rate and regular rhythm.   Pulmonary/Chest: Effort normal and breath sounds normal.  Neurological: Pt is alert. No cranial nerve deficit.  Skin: Skin is warm. No erythema.  Psychiatric: Pt behavior is normal. Thought content normal. 1-2+ nervous, not depressed appearing       Assessment & Plan:

## 2011-06-27 NOTE — Assessment & Plan Note (Addendum)
Unusual right facial swelling without discrete mass or fever; for CT eval today, no meds needed at this time, also for cxr with hx of smoking

## 2011-07-14 ENCOUNTER — Other Ambulatory Visit (HOSPITAL_COMMUNITY)
Admission: RE | Admit: 2011-07-14 | Discharge: 2011-07-14 | Disposition: A | Discharge status: Home / Self Care | Payer: Managed Care, Other (non HMO) | Source: Ambulatory Visit | Attending: Endocrinology | Admitting: Endocrinology

## 2011-07-14 ENCOUNTER — Other Ambulatory Visit (INDEPENDENT_AMBULATORY_CARE_PROVIDER_SITE_OTHER): Payer: Managed Care, Other (non HMO)

## 2011-07-14 ENCOUNTER — Ambulatory Visit (INDEPENDENT_AMBULATORY_CARE_PROVIDER_SITE_OTHER): Payer: Managed Care, Other (non HMO) | Admitting: Endocrinology

## 2011-07-14 ENCOUNTER — Ambulatory Visit: Payer: Managed Care, Other (non HMO) | Admitting: Internal Medicine

## 2011-07-14 ENCOUNTER — Encounter: Payer: Self-pay | Admitting: Endocrinology

## 2011-07-14 VITALS — BP 112/82 | HR 77 | Temp 98.6°F | Ht 64.5 in | Wt 152.6 lb

## 2011-07-14 DIAGNOSIS — E041 Nontoxic single thyroid nodule: Secondary | ICD-10-CM

## 2011-07-14 LAB — TSH: TSH: 1.25 u[IU]/mL (ref 0.35–5.50)

## 2011-07-14 NOTE — Patient Instructions (Addendum)
Please call back and tell us exactly what type of type of thyroid cancer your sister and father had.   A thyroid blood test is being requested for you today.  please call [phone] to hear your test results.  You will be prompted to enter the 9-digit "MRN" number that appears at the top left of this page, followed by #.  Then you will hear the message. You will get another message with the biopsy result, probably in a few days. Update: i called pt with cytol results.  Refer surg.  Pt requests dr Ezzard Standing.  you will receive a phone call, about a day and time for an appointment).

## 2011-07-14 NOTE — Progress Notes (Signed)
Subjective:    Patient ID: [name], female    DOB: Feb 07, 1966, 45 y.o.   MRN: [id number]  HPI Pt states 3 weeks of slight swelling of the right face and neck, but no assoc pain.  sxs are better now, but a ct incidentally noted a left thyroid nodule.  She is unaware of ever having had a thyroid problem.   Past Medical History  Diagnosis Date  . HYPERLIPIDEMIA   . DEPRESSION   . ANXIETY   . ALLERGIC RHINITIS     Past Surgical History  Procedure Date  . Tubal ligation     History   Social History  . Marital Status: Married    Spouse Name: N/A    Number of Children: 1  . Years of Education: N/A   Occupational History  . customer service    Social History Main Topics  . Smoking status: Current Everyday Smoker -- 0.8 packs/day for 20 years    Types: Cigarettes  . Smokeless tobacco: Not on file  . Alcohol Use: Yes  . Drug Use: No  . Sexually Active: Not on file   Other Topics Concern  . Not on file   Social History Narrative  . No narrative on file    Current Outpatient Prescriptions on File Prior to Visit  Medication Sig Dispense Refill  . HYDROcodone-acetaminophen (NORCO) 7.5-325 MG per tablet Take 1 tablet by mouth daily as needed.  30 tablet  5    Allergies  Allergen Reactions  . Doxycycline   . Sulfonamide Derivatives    Family History  Problem Relation Age of Onset  . Cancer Maternal Grandmother   . Hypertension Other   . Stroke Other   sister had a uncertain type of thyroid cancer (she had 1/2 of her thyroid removed).   Father had thyroidect, and lymph node resection, for thyroid cancer.    BP 112/82  Pulse 77  Temp(Src) 98.6 F (37 C) (Oral)  Ht 5' 4.5" (1.638 m)  Wt 152 lb 9.6 oz (69.219 kg)  BMI 25.79 kg/m2  SpO2 97%  LMP 07/06/2011  Review of Systems Pt states fatigue, sensation of fullness of the throat, and easy bruising.  She denies depression, hair loss, cramps, sob, weight change, memory loss, constipation, numbness, blurry  vision, myalgias, dry skin, rhinorrhea, and syncope.    Objective:   Physical Exam VS: see vs page GEN: no distress HEAD: head: no deformity eyes: no periorbital swelling, no proptosis external nose and ears are normal mouth: no lesion seen NECK: supple, thyroid is not enlarged, but there is a 1-2 cm freely mobile left thyroid nodule.   CHEST WALL: no deformity CV: reg rate and rhythm, no murmur MUSCULOSKELETAL: muscle bulk and strength are grossly normal.  no obvious joint swelling.  gait is normal and steady EXTEMITIES: no deformity.  no ulcer on the feet.  feet are of normal color and temp.  no edema PULSES: dorsalis pedis intact bilat.  no carotid bruit NEURO:  cn 2-12 grossly intact.   readily moves all 4's.  sensation is intact to touch on the feet SKIN:  Normal texture and temperature.  No rash or suspicious lesion is visible.   NODES:  None palpable at the neck PSYCH: alert, oriented x3.  Does not appear anxious nor depressed.  Lab Results  Component Value Date   TSH 1.25 07/14/2011    thyroid needle bx: consent obtained, signed form on chart local: xylocaine 2% prep: betadine 3 bxs are done  with 25 and 27g needles no complications       Assessment & Plan:  Papillary adenocarcinoma of the thyroid, new Lymphadenopathy, prob a focus of the cancer Fatigue, not thyroid-related

## 2011-08-13 ENCOUNTER — Ambulatory Visit (INDEPENDENT_AMBULATORY_CARE_PROVIDER_SITE_OTHER): Payer: Managed Care, Other (non HMO) | Admitting: Surgery

## 2011-08-13 VITALS — BP 120/80 | HR 78 | Temp 97.6°F | Resp 18 | Ht 64.5 in | Wt 152.0 lb

## 2011-08-13 DIAGNOSIS — E041 Nontoxic single thyroid nodule: Secondary | ICD-10-CM

## 2011-08-13 NOTE — Progress Notes (Signed)
ASSESSMENT AND PLAN: 1.  Left thyroid mass  Suspicious for thyroid cancer on needle aspiration biopsy.  I discussed surgery with the patient and  the indications and risks.  I think she will need a total thyroidectomy with limited left cervical node dissection.  The main risks are bleeding, infection, recurrent laryngeal nerve injury, and parathyroid gland injury.  I think the patient intended and wants to see my brother, Dr. Narda Bonds.  He has operated on her father and sister.  I called Dr. Salena Saner. Arhaan Chesnut and he will contact the patient and discuss the options with the patient and follow-up her care.  2.  Abnormal left neck lymph node.  Probably metastatic thyroid cancer.  2.  Hyperlipidemia.  On no meds. 3.  Depression/anxiety. 4.  Smokes. 5.  Benign right masticator muscle hypertrophy.  Chief Complaint  Patient presents with  . Thyroid Nodule   REFERRING PHYSICIAN:  Romero Belling, MD.  HISTORY OF PRESENT ILLNESS: [name] is a 45 y.o. (DOB: 1965-11-30)  white female whose primary care physician is Oliver Barre, MD, MD and comes to me today for left thyroid nodule/cancer.  The patient has swelling in the right side of her face and went to her primary doctor, Dr. Oliver Barre. She had no history of facial trauma or problems.  He obtained a CT scan of her head and neck on 26 June 2011. This showed asymmetric enlargement of the right masseter muscle, most compatible with benign masticator muscle hypertrophy.  Unfortunately, also on the CT scan, there was a 15 mm calcified nodule in the left thyroid gland. There was also a 26 mm left cervical lymph node adjacent to the left thyroid gland. There was no other significant adenopathy.  She saw Dr. George Hugh who did a left thyroid needle biopsy.  The pathology shows findings suspicious for papillary carcinoma.  Her history is significant, that her father, [name], had thyroid cancer.  She thought that I did the surgery on her father and  sister, but I think my brother, Dr. Narda Bonds, was the surgeon. Her sister had a thyroid lobectomy about 5 years ago, this was benign.   She has no difficulty swallowing, change in voice, nor has she felt this mass in her neck after it was pointed out to her.  She has had no prior exposure to radiation to the neck.  Past Medical History  Diagnosis Date  . HYPERLIPIDEMIA   . DEPRESSION   . ANXIETY   . ALLERGIC RHINITIS     Past Surgical History  Procedure Date  . Tubal ligation     Current Outpatient Prescriptions  Medication Sig Dispense Refill  . HYDROcodone-acetaminophen (NORCO) 7.5-325 MG per tablet Take 1 tablet by mouth daily as needed.  30 tablet  5    Allergies  Allergen Reactions  . Doxycycline   . Sulfonamide Derivatives     REVIEW OF SYSTEMS: Skin:  No history of rash.  No history of abnormal moles. Infection:  No history of hepatitis or HIV.  No history of MRSA. Neurologic:  No history of stroke.  No history of seizure.  No history of headaches. Cardiac:  No history of hypertension. No history of heart disease.  No history of prior cardiac catheterization.  No history of seeing a cardiologist. Pulmonary:  Smoke cigarettes.  Has a cough today.  No asthma or bronchitis.  No OSA/CPAP.  Endocrine:  No diabetes. Hyperlipidemia, but on no meds. Gastrointestinal:  No history of stomach disease.  No history of liver disease.  No history of gall bladder disease.  No history of pancreas disease.  No history of colon disease. Urologic:  No history of kidney stones.  No history of bladder infections. Musculoskeletal:  Back disease.  She has some chronic back pain, but has not seen ortho or neuro. Hematologic:  No bleeding disorder.  No history of anemia.  Not anticoagulated.  SOCIAL and FAMILY HISTORY:  Married. Has one son, 42 yo. Works with Clinical biochemist,  [employer] - cigarette shipping.  PHYSICAL EXAM: BP 120/80  Pulse 78  Temp(Src) 97.6 F (36.4 C)  (Temporal)  Resp 18  Ht 5' 4.5" (1.638 m)  Wt 152 lb (68.947 kg)  BMI 25.69 kg/m2  General: WNWF.  Has a cough. HEENT: Normal. Pupils equal. Normal dentition.  She has some asymettry of her face with her right side of her face more prominent than the left. Neck: Supple. Small left thyroid mass, approx 1.0 cm. Lymph Nodes:  I am not sure that I can feel the left thyroid lymph node. Lungs: Clear and symmetric. Heart:  RRR. No murmur. Abdomen: No mass. No tenderness. No hernia. Normal bowel sounds.  No abdominal scars. Rectal: Not done. Extremities:  Good strength in upper and lower extremities. Neurologic:  Grossly intact to motor and sensory function.  DATA REVIEWED: CT report.  Path report (to patient).  [name], M.D., [facility], [place] [phone]

## 2011-08-19 ENCOUNTER — Encounter (HOSPITAL_COMMUNITY)
Admission: RE | Admit: 2011-08-19 | Discharge: 2011-08-19 | Disposition: A | Discharge status: Home / Self Care | Payer: Managed Care, Other (non HMO) | Source: Ambulatory Visit | Attending: Otolaryngology | Admitting: Otolaryngology

## 2011-08-19 ENCOUNTER — Other Ambulatory Visit (HOSPITAL_COMMUNITY): Payer: Self-pay | Admitting: Otolaryngology

## 2011-08-19 ENCOUNTER — Other Ambulatory Visit (HOSPITAL_COMMUNITY): Payer: Managed Care, Other (non HMO)

## 2011-08-19 DIAGNOSIS — E079 Disorder of thyroid, unspecified: Secondary | ICD-10-CM

## 2011-08-19 LAB — CBC
HCT: 44.6 % (ref 36.0–46.0)
MCH: 32.8 pg (ref 26.0–34.0)
MCV: 93.7 fL (ref 78.0–100.0)
Platelets: 301 10*3/uL (ref 150–400)
RBC: 4.76 MIL/uL (ref 3.87–5.11)

## 2011-08-19 LAB — SURGICAL PCR SCREEN: Staphylococcus aureus: NEGATIVE

## 2011-08-24 ENCOUNTER — Ambulatory Visit (HOSPITAL_COMMUNITY)
Admission: RE | Admit: 2011-08-24 | Discharge: 2011-08-25 | Disposition: A | Discharge status: Home / Self Care | Payer: Managed Care, Other (non HMO) | Source: Ambulatory Visit | Attending: Otolaryngology | Admitting: Otolaryngology

## 2011-08-24 ENCOUNTER — Other Ambulatory Visit: Payer: Self-pay | Admitting: Otolaryngology

## 2011-08-24 DIAGNOSIS — F17200 Nicotine dependence, unspecified, uncomplicated: Secondary | ICD-10-CM | POA: Insufficient documentation

## 2011-08-24 DIAGNOSIS — Z01818 Encounter for other preprocedural examination: Secondary | ICD-10-CM | POA: Insufficient documentation

## 2011-08-24 DIAGNOSIS — C73 Malignant neoplasm of thyroid gland: Secondary | ICD-10-CM | POA: Insufficient documentation

## 2011-08-24 DIAGNOSIS — F1010 Alcohol abuse, uncomplicated: Secondary | ICD-10-CM | POA: Insufficient documentation

## 2011-08-24 DIAGNOSIS — E041 Nontoxic single thyroid nodule: Secondary | ICD-10-CM | POA: Insufficient documentation

## 2011-08-24 DIAGNOSIS — Z01812 Encounter for preprocedural laboratory examination: Secondary | ICD-10-CM | POA: Insufficient documentation

## 2011-08-24 DIAGNOSIS — C770 Secondary and unspecified malignant neoplasm of lymph nodes of head, face and neck: Secondary | ICD-10-CM | POA: Insufficient documentation

## 2011-08-25 LAB — CALCIUM: Calcium: 8.1 mg/dL — ABNORMAL LOW (ref 8.4–10.5)

## 2011-08-27 ENCOUNTER — Telehealth: Payer: Self-pay | Admitting: *Deleted

## 2011-08-27 NOTE — Telephone Encounter (Signed)
Appointment scheduled 08/31/2011 2:30pm.

## 2011-08-27 NOTE — Telephone Encounter (Signed)
Message copied by Carin Primrose on Thu Aug 27, 2011 10:36 AM ------      Message from: [name]      Created: Wed Aug 26, 2011  5:07 PM       please call patient:      Needs f/u ov next week

## 2011-08-31 ENCOUNTER — Ambulatory Visit (INDEPENDENT_AMBULATORY_CARE_PROVIDER_SITE_OTHER): Payer: Managed Care, Other (non HMO) | Admitting: Endocrinology

## 2011-08-31 ENCOUNTER — Encounter: Payer: Self-pay | Admitting: Endocrinology

## 2011-08-31 ENCOUNTER — Other Ambulatory Visit (INDEPENDENT_AMBULATORY_CARE_PROVIDER_SITE_OTHER): Payer: Managed Care, Other (non HMO)

## 2011-08-31 DIAGNOSIS — E890 Postprocedural hypothyroidism: Secondary | ICD-10-CM

## 2011-08-31 LAB — BASIC METABOLIC PANEL
CO2: 30 mEq/L (ref 19–32)
Calcium: 8.2 mg/dL — ABNORMAL LOW (ref 8.4–10.5)
Chloride: 102 mEq/L (ref 96–112)
Glucose, Bld: 69 mg/dL — ABNORMAL LOW (ref 70–99)
Sodium: 139 mEq/L (ref 135–145)

## 2011-08-31 NOTE — Progress Notes (Signed)
  Subjective:    Patient ID: [name], female    DOB: [birth year], 45 y.o.   MRN: [id number]  HPI The state of at least three ongoing medical problems is addressed today: Postsurgical hypothyroidism: Pt states fatigue since thyroidect approx 1 week ago.   Papillary adenocarcinoma of the thyroid: Pt was noted to have 2/2 nodes pos.  We discussed staging and adjuvant rx.   Pt had mildly low ca++ postoperatively, but pt states she feels well in general.   Past Medical History  Diagnosis Date  . HYPERLIPIDEMIA   . DEPRESSION   . ANXIETY   . ALLERGIC RHINITIS     Past Surgical History  Procedure Date  . Tubal ligation     History   Social History  . Marital Status: Married    Spouse Name: N/A    Number of Children: 1  . Years of Education: N/A   Occupational History  . customer service    Social History Main Topics  . Smoking status: Current Everyday Smoker -- 0.8 packs/day for 20 years    Types: Cigarettes  . Smokeless tobacco: Not on file  . Alcohol Use: Yes  . Drug Use: No  . Sexually Active: Not on file   Other Topics Concern  . Not on file   Social History Narrative  . No narrative on file    Current Outpatient Prescriptions on File Prior to Visit  Medication Sig Dispense Refill  . HYDROcodone-acetaminophen (NORCO) 7.5-325 MG per tablet Take 1 tablet by mouth daily as needed.  30 tablet  5    Allergies  Allergen Reactions  . Doxycycline   . Sulfonamide Derivatives     Family History  Problem Relation Age of Onset  . Cancer Maternal Grandmother   . Hypertension Other   . Stroke Other     BP 110/82  Pulse 83  Temp(Src) 98 F (36.7 C) (Oral)  Ht 5\' 6"  (1.676 m)  Wt 154 lb 8 oz (70.081 kg)  BMI 24.94 kg/m2  SpO2 95%  LMP 08/26/2011  Review of Systems Denies cramps and numbness    Objective:   Physical Exam VITAL SIGNS:  See vs page GENERAL: no distress Neck: a healing scar is present.   Lab Results  Component Value Date   WBC  7.5 08/19/2011   HGB 15.6* 08/19/2011   HCT 44.6 08/19/2011   PLT 301 08/19/2011   GLUCOSE 69* 08/31/2011   CHOL 190 03/18/2011   TRIG 51.0 03/18/2011   HDL 51.20 03/18/2011   LDLDIRECT 131.0 03/10/2007   LDLCALC 129* 03/18/2011   ALT 15 03/18/2011   AST 15 03/18/2011   NA 139 08/31/2011   K 4.0 08/31/2011   CL 102 08/31/2011   CREATININE 0.7 08/31/2011   BUN 11 08/31/2011   CO2 30 08/31/2011   TSH 22.94* 08/31/2011      Assessment & Plan:  Papillary adenocarcinoma of the thyroid.   Postsurgical hypothyroidism.  She will be ready for i-131 soon Mild postsurgical hypocalcemia, which is usually transient.

## 2011-08-31 NOTE — Patient Instructions (Addendum)
blood tests are being requested for you today.  please call [phone] to hear your test results.  You will be prompted to enter the 9-digit "MRN" number that appears at the top left of this page, followed by #.  Then you will hear the message. Based on the results, i will probably tell you to go back to the lab in 3-7 days, to retest the blood. (update: i left message on phone-tree:  Go back to lab for recheck of tsh in a few days).

## 2011-09-07 ENCOUNTER — Telehealth: Payer: Self-pay | Admitting: *Deleted

## 2011-09-07 ENCOUNTER — Other Ambulatory Visit: Payer: Self-pay | Admitting: Endocrinology

## 2011-09-07 ENCOUNTER — Other Ambulatory Visit (INDEPENDENT_AMBULATORY_CARE_PROVIDER_SITE_OTHER): Payer: Managed Care, Other (non HMO)

## 2011-09-07 DIAGNOSIS — C73 Malignant neoplasm of thyroid gland: Secondary | ICD-10-CM | POA: Insufficient documentation

## 2011-09-07 DIAGNOSIS — E890 Postprocedural hypothyroidism: Secondary | ICD-10-CM

## 2011-09-07 LAB — TSH: TSH: 43.35 u[IU]/mL — ABNORMAL HIGH (ref 0.35–5.50)

## 2011-09-07 MED ORDER — LEVOTHYROXINE SODIUM 125 MCG PO TABS: 125.0000 ug | ORAL_TABLET | Freq: Every day | ORAL | Status: DC

## 2011-09-07 NOTE — Telephone Encounter (Signed)
Pt left message requesting results of lab test and also questions regarding activities after I-131 therapy (wants know to if she can still use telephone, watch TV, able to go outside)-please advise   

## 2011-09-07 NOTE — Telephone Encounter (Signed)
Any of these activities are fine.  The only limitation is to avoid others

## 2011-09-07 NOTE — Telephone Encounter (Signed)
Pt left message requesting results of lab test and also questions regarding activities after I-131 therapy (wants know to if she can still use telephone, watch TV, able to go outside)-please advise

## 2011-09-08 NOTE — Telephone Encounter (Signed)
Pt informed of MD's advisement. 

## 2011-09-09 NOTE — Op Note (Signed)
[name], [name]             ACCOUNT NO.:  [id number]  MEDICAL RECORD NO.:  [id number]  LOCATION:  5523                         FACILITY:  [facility]  PHYSICIAN:  Kristine Garbe. Ezzard Standing, M.D.DATE OF BIRTH:  October 26, 1966  DATE OF PROCEDURE:  08/24/2011 DATE OF DISCHARGE:                              OPERATIVE REPORT   PREOPERATIVE DIAGNOSES:  Left thyroid mass with fine-needle aspirate suspicious for papillary thyroid carcinoma.  Enlarged left neck lymph nodes, supraclavicular area, consistent with carcinoma on CT scan.  POSTOPERATIVE DIAGNOSES:  Left thyroid mass with fine-needle aspirate suspicious for papillary thyroid carcinoma.  Enlarged left neck lymph nodes, supraclavicular area, consistent with carcinoma on CT scan.  OPERATION PERFORMED:  Total thyroidectomy.  Limited left neck dissection.  SURGEON:  Dillard Cannon, M.D.  ASSISTANT SURGEON:  Hermelinda Medicus, M.D.  ANESTHESIA:  General endotracheal.  ESTIMATED BLOOD LOSS:  50 mL.  DRAINS:  JP drain.  COMPLICATIONS:  None.  BRIEF CLINICAL NOTE:  [name] is a 45 year old female who was previously seen Dr. Everardo Grant for evaluation of a thyroid nodule.  Fine- needle aspirate on the thyroid nodule was suspicious for possible papillary carcinoma, but not definitive.  Review of her CT scan shows a large left lower neck node adjacent to the thyroid gland behind the jugular vein, that is partially calcified consistent with a probable metastatic papillary carcinoma in addition to approximately 2 cm left thyroid nodule.  The right thyroid gland was fairly benign on evaluation with a no right-sided neck adenopathy.  She was taken to the operating room at this time for excision of the left lower neck nodes and if these are positive for papillary carcinoma, will subsequently undergo total thyroidectomy and I discussed this with her prior to surgery as well as the risks to the recurrent laryngeal nerves and risk to  parathyroid glands.  She is aware of these risks and would like to go ahead with surgery.  She has a significant family history with a father with papillary thyroid cancer with multiple neck nodes involved.  She is otherwise healthy.  She does smoke 1 pack a day and is trying to stop her smoking.  She was taken to the operating room at this time for above stated surgery.  DESCRIPTION OF PROCEDURE:  After adequate endotracheal anesthesia, neck was prepped with Betadine solution.  A standard curvilinear supraclavicular incision was marked out and injected with Xylocaine with epinephrine for hemostasis.  Incision was then performed and extended a little bit more on the left side where she had enlarged palpable low neck node.  First dissection of the lower neck adenopathy was performed. This was performed between the medial aspect of the sternocleidomastoid muscle and the strap muscles.  Dissection was carried out and the node was palpable and was deep to the jugular vein which especially went over the top of the node.  Dissection was carried out between the jugular vein and the carotid artery.  Jugular vein was isolated and loops were placed around the jugular vein to retract it laterally.  The carotid artery and vagus nerve were identified and preserved medially, and dissection was carried out between the jugular vein and carotid artery and vagus nerve.  This node was down deep to the jugular vein and had rich supply of lymphatics.  On dissecting out the inferior aspect of this, several ligatures of 3-0 silk were performed to secure any lymphatic drainage.  The node was carefully dissected out being careful to protect the jugular vein as well as the vagus nerve and carotid artery.  After dissecting the node along with maybe some adjacent nodes out, these were sent to Pathology for frozen section to see if we can get a definitive diagnosis of papillary carcinoma.  Hemostasis was obtained  with the bipolar cautery and ligatures.  The dissection was then carried out on the thyroid lobe.  The strap muscles were divided in midline and retracted laterally.  First the left thyroid lobe was dissected out.  During this dissection, the frozen section on the lymph node revealed findings consistent with metastatic papillary thyroid cancer.  The isthmus of the thyroid gland was divided and the left lobe of the thyroid gland was dissected out first.  The patient had a large and firm nodule in the superior aspect of the thyroid lobe adjacent to the trachea and cricoid cartilage.  The inferior thyroid artery was identified and was ligated with 3-0 silk suture.  The lateral thyroid veins and superior thyroid veins were likewise ligated with 3-0 silk suture.  The thyroid gland was dissected off of the trachea and then out of the tracheoesophageal groove being careful to stay close to the thyroid gland.  The parathyroid glands were identified inferiorly as well as superiorly and preserved with surrounding vasculature.  The recurrent nerve was identified superiorly within the esophageal-tracheal groove and preserved likewise.  Dissection of this area was carried out with bipolar cautery.  The lobe was removed entirely and sent to Pathology as a separate specimen.  After getting adequate hemostasis, next the right thyroid lobe was dissected out.  The patient had a fairly large superior extent of the right thyroid lobe.  Again, the parathyroid gland was identified as was the inferior thyroid arteries which were ligated with 2-0 and 3-0 silk sutures.  Some of the lateral thyroid veins were also ligated with 3-0 silk suture, as was the superior thyroid vein and artery.  Dissection was carried out adjacent to the thyroid gland in the tracheoesophageal groove.  Again, the suspensory ligament was dissected off with bipolar cautery, so as not to damage to the recurrent nerve.  The right thyroid lobe  was removed and sent as a right thyroid lobe specimen as a separate specimen.  Hemostasis was obtained with bipolar cautery.  Wound was then irrigated with saline and hemostasis was rechecked.  The patient had good hemostasis and a Al Pimple drain was brought out through a separate stab wound in the right lower neck and passed onto both sides of the trachea where the thyroid lobes were.  The wound was then closed with 3-0 chromic sutures subcutaneously and reapproximated the platysma muscle and subcutaneous tissue and the skin was reapproximated with a 5-0 nylon subcuticular stitch along with Steri-Strips.  [place]ola was awakened from the anesthesia and transferred to recovery room and postop doing well.  DISPOSITION:  [name] will be admitted to the hospital for 24 hour observation.  We will plan on removing JP drain when the output is decreased and we will discharge home after removal of the JP drain.  She received perioperative IV Ancef and we will check calcium levels in the morning.          ______________________________  Kristine Garbe. Ezzard Standing, M.D.     CEN/MEDQ  D:  08/24/2011  T:  08/24/2011  Job:  [id number]  cc:   Gregary Signs A. Jessica All, MD Hermelinda Medicus, M.D.  Electronically Signed by Dillard Cannon M.D. on 09/09/2011 02:01:41 PM

## 2011-09-11 ENCOUNTER — Telehealth: Payer: Self-pay | Admitting: *Deleted

## 2011-09-11 NOTE — Telephone Encounter (Signed)
Yes, correct.  Please wait until 1-2 days after

## 2011-09-11 NOTE — Telephone Encounter (Signed)
Pt is asking for clarification on phone tree message- is pt is to start taking thyroid medication the day after radioactive iodine tx and not before-please advise

## 2011-09-11 NOTE — Telephone Encounter (Signed)
Pt informed of MD's advisement. Pt wants to know if she can have work note extended to time she will miss after having I-131 tx.

## 2011-09-11 NOTE — Telephone Encounter (Signed)
Left message for pt to callback office.  

## 2011-09-14 ENCOUNTER — Encounter (HOSPITAL_COMMUNITY)
Admission: RE | Admit: 2011-09-14 | Discharge: 2011-09-14 | Disposition: A | Discharge status: Home / Self Care | Payer: Managed Care, Other (non HMO) | Source: Ambulatory Visit | Attending: Endocrinology | Admitting: Endocrinology

## 2011-09-14 ENCOUNTER — Ambulatory Visit (HOSPITAL_COMMUNITY): Payer: Managed Care, Other (non HMO)

## 2011-09-14 ENCOUNTER — Encounter (HOSPITAL_COMMUNITY): Payer: Self-pay

## 2011-09-14 ENCOUNTER — Other Ambulatory Visit: Payer: Self-pay | Admitting: Endocrinology

## 2011-09-14 DIAGNOSIS — C73 Malignant neoplasm of thyroid gland: Secondary | ICD-10-CM

## 2011-09-14 HISTORY — DX: Malignant neoplasm of thyroid gland: C73

## 2011-09-14 LAB — HCG, SERUM, QUALITATIVE: Preg, Serum: NEGATIVE

## 2011-09-14 MED ORDER — SODIUM IODIDE I 131 CAPSULE
157.6000 | Freq: Once | INTRAVENOUS | Status: AC | PRN
  Administered 2011-09-14: 157.6 via ORAL

## 2011-09-14 NOTE — Telephone Encounter (Signed)
Pt states she received out of work note from Marine scientist, keeping her out until the 10th. She will return to Radiology for her scan on the 9th

## 2011-09-14 NOTE — Telephone Encounter (Signed)
Yes, i can.  i just need to know the date of your i-131 pill

## 2011-09-14 NOTE — Telephone Encounter (Signed)
i can say ret fri or mon--your choice

## 2011-09-14 NOTE — Telephone Encounter (Signed)
Today at 12:45 at [facility]

## 2011-09-25 ENCOUNTER — Encounter (HOSPITAL_COMMUNITY): Payer: Self-pay

## 2011-09-25 ENCOUNTER — Encounter (HOSPITAL_COMMUNITY)
Admission: RE | Admit: 2011-09-25 | Discharge: 2011-09-25 | Disposition: A | Discharge status: Home / Self Care | Payer: Managed Care, Other (non HMO) | Source: Ambulatory Visit | Attending: Endocrinology | Admitting: Endocrinology

## 2011-09-25 ENCOUNTER — Encounter: Payer: Self-pay | Admitting: Endocrinology

## 2011-09-25 DIAGNOSIS — C73 Malignant neoplasm of thyroid gland: Secondary | ICD-10-CM | POA: Insufficient documentation

## 2011-11-04 ENCOUNTER — Encounter: Payer: Self-pay | Admitting: Endocrinology

## 2011-11-04 ENCOUNTER — Ambulatory Visit (INDEPENDENT_AMBULATORY_CARE_PROVIDER_SITE_OTHER): Payer: Managed Care, Other (non HMO) | Admitting: Endocrinology

## 2011-11-04 ENCOUNTER — Other Ambulatory Visit (INDEPENDENT_AMBULATORY_CARE_PROVIDER_SITE_OTHER): Payer: Managed Care, Other (non HMO)

## 2011-11-04 VITALS — BP 126/88 | HR 76 | Temp 98.8°F | Wt 156.4 lb

## 2011-11-04 DIAGNOSIS — E890 Postprocedural hypothyroidism: Secondary | ICD-10-CM

## 2011-11-04 LAB — TSH: TSH: 1.44 u[IU]/mL (ref 0.35–5.50)

## 2011-11-04 MED ORDER — LEVOTHYROXINE SODIUM 137 MCG PO TABS: 137.0000 ug | ORAL_TABLET | Freq: Every day | ORAL | Status: DC

## 2011-11-04 NOTE — Patient Instructions (Addendum)
blood tests are being requested for you today.  please call [phone] to hear your test results.  You will be prompted to enter the 9-digit "MRN" number that appears at the top left of this page, followed by #.  Then you will hear the message. Please come back for a follow-up appointment in 4 months.   (update: i left message on phone-tree:  Increase synthroid to 137/d)

## 2011-11-04 NOTE — Progress Notes (Signed)
  Subjective:    Patient ID: [name], female    DOB: Oct 19, 1966, 45 y.o.   MRN: [id number]  HPI Pt is 2 1/2 mos s/p thyroidect for stage 1 papillary adenocarcinoma of the thyroid.  pt states she has depression and anxiety.   Past Medical History  Diagnosis Date  . HYPERLIPIDEMIA   . DEPRESSION   . ANXIETY   . ALLERGIC RHINITIS   . Thyroid carcinoma     Past Surgical History  Procedure Date  . Tubal ligation     History   Social History  . Marital Status: Married    Spouse Name: N/A    Number of Children: 1  . Years of Education: N/A   Occupational History  . customer service    Social History Main Topics  . Smoking status: Current Everyday Smoker -- 0.8 packs/day for 20 years    Types: Cigarettes  . Smokeless tobacco: Not on file  . Alcohol Use: Yes  . Drug Use: No  . Sexually Active: Not on file   Other Topics Concern  . Not on file   Social History Narrative  . No narrative on file    Current Outpatient Prescriptions on File Prior to Visit  Medication Sig Dispense Refill  . HYDROcodone-acetaminophen (NORCO) 7.5-325 MG per tablet Take 1 tablet by mouth daily as needed.  30 tablet  5    Allergies  Allergen Reactions  . Doxycycline   . Sulfonamide Derivatives     Family History  Problem Relation Age of Onset  . Cancer Maternal Grandmother   . Hypertension Other   . Stroke Other     BP 126/88  Pulse 76  Temp(Src) 98.8 F (37.1 C) (Oral)  Wt 156 lb 6.4 oz (70.943 kg)  SpO2 97%  LMP 10/06/2011  Review of Systems He has slight discomfort of the neck.      Objective:   Physical Exam VITAL SIGNS:  See vs page. GENERAL: no distress.  Neck: a healed scar is present.  i do not appreciate a nodule in the thyroid or elsewhere in the neck.   Lab Results  Component Value Date   TSH 1.44 11/04/2011      Assessment & Plan:  Post-surgical hypothyroidism, needs increased rx, in view of her h/o thyroid cancer.  We discussed plans for f/u  next year.   Depression, not thyroid-related

## 2011-11-30 ENCOUNTER — Telehealth: Payer: Self-pay | Admitting: *Deleted

## 2011-11-30 NOTE — Telephone Encounter (Signed)
Pt informed of MD's advisement regarding Synthroid. Appointment scheduled 01/05/2011 at 8:15am.

## 2011-11-30 NOTE — Telephone Encounter (Signed)
Pt states that early March would be fine.

## 2011-11-30 NOTE — Telephone Encounter (Signed)
We can go as early as late march.  Is that ok?

## 2011-11-30 NOTE — Telephone Encounter (Signed)
Ok.  Please stop synthroid 12/24/11, and return here approx 01/06/12.

## 2011-11-30 NOTE — Telephone Encounter (Signed)
Pt wants to know if she can take radiation pill before April for insurance purposes-please advise.

## 2011-12-17 ENCOUNTER — Telehealth: Payer: Self-pay | Admitting: Internal Medicine

## 2011-12-17 NOTE — Telephone Encounter (Signed)
The pt called and is hoping she can take her radiation pill towards the end of February.  Is this okay?  Her call back number [phone].   Thanks!!

## 2011-12-17 NOTE — Telephone Encounter (Signed)
I defer to Dr Everardo All

## 2011-12-20 NOTE — Telephone Encounter (Signed)
Stop synthroid now Ov here approx 01/03/12

## 2011-12-22 NOTE — Telephone Encounter (Signed)
Left message on machine for pt to return my call  

## 2011-12-23 NOTE — Telephone Encounter (Signed)
Appointment scheduled 12/31/2011 9:15am.

## 2011-12-23 NOTE — Telephone Encounter (Signed)
Pt informed of MD's advisement. Transferred to scheduler for appointment.

## 2011-12-31 ENCOUNTER — Ambulatory Visit: Payer: Managed Care, Other (non HMO) | Admitting: Endocrinology

## 2012-01-05 ENCOUNTER — Ambulatory Visit (INDEPENDENT_AMBULATORY_CARE_PROVIDER_SITE_OTHER): Payer: Managed Care, Other (non HMO) | Admitting: Endocrinology

## 2012-01-05 ENCOUNTER — Other Ambulatory Visit: Payer: Managed Care, Other (non HMO)

## 2012-01-05 ENCOUNTER — Encounter: Payer: Self-pay | Admitting: Endocrinology

## 2012-01-05 DIAGNOSIS — C73 Malignant neoplasm of thyroid gland: Secondary | ICD-10-CM

## 2012-01-05 DIAGNOSIS — E890 Postprocedural hypothyroidism: Secondary | ICD-10-CM

## 2012-01-05 NOTE — Patient Instructions (Addendum)
blood tests are being requested for you today.  please call [phone] to hear your test results.  You will be prompted to enter the 9-digit "MRN" number that appears at the top left of this page, followed by #.  Then you will hear the message. Please come back for a follow-up appointment in 4 months.   Resume the levothyroxine. (update: i left message on phone-tree:  TG is low, so you can wait on scan)

## 2012-01-05 NOTE — Progress Notes (Signed)
  Subjective:    Patient ID: [name], female    DOB: 07-11-1966, 46 y.o.   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  She had adjuvant i-131 rx.  She will lose her insurance in 10 days.  She will regain it in 3 1/2 mos.  pt states she feels well in general, except for fatigue.   Postsurgical hypothyroidism: She has been off synthroid x 8 days.  She has a few lbs of weight gain.   postsurgical hypoparathyroidism: No cramps Past Medical History  Diagnosis Date  . HYPERLIPIDEMIA   . DEPRESSION   . ANXIETY   . ALLERGIC RHINITIS   . Thyroid carcinoma     Past Surgical History  Procedure Date  . Tubal ligation     History   Social History  . Marital Status: Married    Spouse Name: N/A    Number of Children: 1  . Years of Education: N/A   Occupational History  . customer service    Social History Main Topics  . Smoking status: Current Everyday Smoker -- 0.8 packs/day for 20 years    Types: Cigarettes  . Smokeless tobacco: Not on file  . Alcohol Use: Yes  . Drug Use: No  . Sexually Active: Not on file   Other Topics Concern  . Not on file   Social History Narrative  . No narrative on file    Current Outpatient Prescriptions on File Prior to Visit  Medication Sig Dispense Refill  . HYDROcodone-acetaminophen (NORCO) 7.5-325 MG per tablet Take 1 tablet by mouth daily as needed.  30 tablet  5  . levothyroxine (SYNTHROID, LEVOTHROID) 137 MCG tablet Take 1 tablet (137 mcg total) by mouth daily.  30 tablet  11    Allergies  Allergen Reactions  . Doxycycline   . Sulfonamide Derivatives     Family History  Problem Relation Age of Onset  . Cancer Maternal Grandmother   . Hypertension Other   . Stroke Other     BP 122/90  Pulse 84  Temp(Src) 98.7 F (37.1 C) (Oral)  Wt 158 lb 6.4 oz (71.85 kg)  SpO2 99%   Review of  Systems Denies neck lump and neck pain    Objective:   Physical Exam VITAL SIGNS:  See vs page GENERAL: no distress Neck: a healed scar is present.  i do not appreciate a nodule in the thyroid or elsewhere in the neck.   Lab Results  Component Value Date   PTH 15.8 01/05/2012   CALCIUM 9.4 01/05/2012  thyroglobulin=2 (Ab neg)    Assessment & Plan:  Stage-1 papillary adenocarcinoma of the thyroid.  She will not have insurance when repeat scan is due, but TG level is low. Post-surgical hypothyroidism.  She can go back on synthroid Post-surgical hypoparathyroidism, better

## 2012-01-06 ENCOUNTER — Ambulatory Visit: Payer: Managed Care, Other (non HMO) | Admitting: Endocrinology

## 2012-01-06 LAB — THYROGLOBULIN LEVEL: Thyroglobulin: 2.3 ng/mL (ref 0.0–55.0)

## 2012-01-06 LAB — PTH, INTACT AND CALCIUM: PTH: 15.8 pg/mL (ref 14.0–72.0)

## 2012-01-15 ENCOUNTER — Ambulatory Visit: Payer: Managed Care, Other (non HMO) | Admitting: Internal Medicine

## 2012-03-04 ENCOUNTER — Ambulatory Visit: Payer: Managed Care, Other (non HMO) | Admitting: Internal Medicine

## 2012-03-08 ENCOUNTER — Ambulatory Visit: Payer: Managed Care, Other (non HMO) | Admitting: Endocrinology

## 2012-05-10 ENCOUNTER — Encounter: Payer: Self-pay | Admitting: Endocrinology

## 2012-05-10 ENCOUNTER — Other Ambulatory Visit (INDEPENDENT_AMBULATORY_CARE_PROVIDER_SITE_OTHER): Payer: BC Managed Care – PPO

## 2012-05-10 ENCOUNTER — Ambulatory Visit (INDEPENDENT_AMBULATORY_CARE_PROVIDER_SITE_OTHER): Payer: BC Managed Care – PPO | Admitting: Endocrinology

## 2012-05-10 VITALS — BP 120/72 | HR 73 | Temp 97.9°F | Ht 64.5 in | Wt 157.0 lb

## 2012-05-10 DIAGNOSIS — E890 Postprocedural hypothyroidism: Secondary | ICD-10-CM

## 2012-05-10 DIAGNOSIS — C73 Malignant neoplasm of thyroid gland: Secondary | ICD-10-CM

## 2012-05-10 NOTE — Progress Notes (Signed)
  Subjective:    Patient ID: [name], female    DOB: 06-Jun-1966, 46 y.o.   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  She had adjuvant i-131 rx.  She has regained her insurance, but will not have coverage for pre-existing conditions for 1 more year.  pt states she feels well in general.  She is back on her synthroid.   Past Medical History  Diagnosis Date  . HYPERLIPIDEMIA   . DEPRESSION   . ANXIETY   . ALLERGIC RHINITIS   . Thyroid carcinoma     Past Surgical History  Procedure Date  . Tubal ligation     History   Social History  . Marital Status: Married    Spouse Name: N/A    Number of Children: 1  . Years of Education: N/A   Occupational History  . customer service    Social History Main Topics  . Smoking status: Current Everyday Smoker -- 0.8 packs/day for 20 years    Types: Cigarettes  . Smokeless tobacco: Not on file  . Alcohol Use: Yes  . Drug Use: No  . Sexually Active: Not on file   Other Topics Concern  . Not on file   Social History Narrative  . No narrative on file    Current Outpatient Prescriptions on File Prior to Visit  Medication Sig Dispense Refill  . HYDROcodone-acetaminophen (NORCO) 7.5-325 MG per tablet Take 1 tablet by mouth daily as needed.  30 tablet  5    Allergies  Allergen Reactions  . Doxycycline   . Sulfonamide Derivatives     Family History  Problem Relation Age of Onset  . Cancer Maternal Grandmother   . Hypertension Other   . Stroke Other     BP 120/72  Pulse 73  Temp 97.9 F (36.6 C) (Oral)  Ht 5' 4.5" (1.638 m)  Wt 157 lb (71.215 kg)  BMI 26.53 kg/m2  SpO2 97%  LMP 05/08/2012    Review of Systems Denies any lump in her neck.  She has lost a few lbs.      Objective:   Physical Exam VITAL SIGNS:  See vs page. GENERAL: no distress. Neck: a healed scar is present.   i do not appreciate a nodule in the thyroid or elsewhere in the neck  Lab Results  Component Value Date   TSH 0.88 05/10/2012  Thyroglobulin 0.0 - 55.0 ng/mL 0.7     Assessment & Plan:  Thyroid cancer.  No evidence of recurrence Post-i-131 hypothyroidism.  She needs to be on a suppressive dosage of synthroid.

## 2012-05-10 NOTE — Patient Instructions (Addendum)
blood tests are being requested for you today.  You will receive a letter with results.   Please come back for a follow-up appointment in 6 months. 

## 2012-05-11 ENCOUNTER — Encounter: Payer: Self-pay | Admitting: Endocrinology

## 2012-05-11 LAB — THYROGLOBULIN LEVEL: Thyroglobulin: 0.7 ng/mL (ref 0.0–55.0)

## 2012-05-11 MED ORDER — LEVOTHYROXINE SODIUM 150 MCG PO TABS: 150.0000 ug | ORAL_TABLET | Freq: Every day | ORAL | Status: DC

## 2012-05-13 ENCOUNTER — Telehealth: Payer: Self-pay | Admitting: *Deleted

## 2012-05-13 NOTE — Telephone Encounter (Signed)
Called pt to inform of lab results, pt informed (letter also mailed to pt). 

## 2012-09-15 ENCOUNTER — Encounter: Payer: BC Managed Care – PPO | Admitting: Internal Medicine

## 2012-09-20 ENCOUNTER — Telehealth: Payer: Self-pay | Admitting: Endocrinology

## 2012-09-20 NOTE — Telephone Encounter (Signed)
Pt calling to check if Dr. Everardo All has received blood work from [facility] and whether or not her thyroid meds need to be increased.

## 2012-09-20 NOTE — Telephone Encounter (Signed)
With the large number of papers we have awaiting scanning, it may be best to ask [facility] to resend.

## 2012-09-20 NOTE — Telephone Encounter (Signed)
Pt advised per dr. ellsion's message and states she will fax report to [place] on wednesday

## 2012-09-23 ENCOUNTER — Telehealth: Payer: Self-pay | Admitting: Endocrinology

## 2012-09-23 NOTE — Telephone Encounter (Signed)
Called pt and told her that Dr Everardo All received her letter and test results. Advised pt according to Dr. George Hugh note. Pt understood.

## 2012-09-23 NOTE — Telephone Encounter (Signed)
please call patient: i received your letter and test results. In someone with thyroid cancer, this blood test is in the correct range. It has been found that taking a little extra thyroid hormone decreased the chances of recurrence. i'll see you next time.

## 2012-11-23 ENCOUNTER — Encounter: Payer: BC Managed Care – PPO | Admitting: Internal Medicine

## 2012-11-29 ENCOUNTER — Ambulatory Visit: Payer: BC Managed Care – PPO | Admitting: Endocrinology

## 2012-12-06 ENCOUNTER — Ambulatory Visit: Payer: BC Managed Care – PPO | Admitting: Endocrinology

## 2012-12-27 ENCOUNTER — Ambulatory Visit: Payer: BC Managed Care – PPO | Admitting: Endocrinology

## 2013-01-03 ENCOUNTER — Ambulatory Visit (INDEPENDENT_AMBULATORY_CARE_PROVIDER_SITE_OTHER): Payer: BC Managed Care – PPO | Admitting: Endocrinology

## 2013-01-03 VITALS — BP 130/74 | HR 94 | Wt 162.0 lb

## 2013-01-03 DIAGNOSIS — C73 Malignant neoplasm of thyroid gland: Secondary | ICD-10-CM

## 2013-01-03 NOTE — Patient Instructions (Addendum)
blood tests are being requested for you today.  We'll contact you with results. Please come back for a follow-up appointment in 6 months  

## 2013-01-03 NOTE — Progress Notes (Signed)
  Subjective:    Patient ID: [name], female    DOB: 1966-04-21, 47 y.o.   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  She then had adjuvant i-131 rx.  She has regained her insurance, but will not have coverage for pre-existing conditions for 6 more months.  pt states she feels well in general.  She does not notice any nodule at the anterior neck Past Medical History  Diagnosis Date  . HYPERLIPIDEMIA   . DEPRESSION   . ANXIETY   . ALLERGIC RHINITIS   . Thyroid carcinoma     Past Surgical History  Procedure Laterality Date  . Tubal ligation      History   Social History  . Marital Status: Married    Spouse Name: N/A    Number of Children: 1  . Years of Education: N/A   Occupational History  . customer service    Social History Main Topics  . Smoking status: Current Every Day Smoker -- 0.80 packs/day for 20 years    Types: Cigarettes  . Smokeless tobacco: Not on file  . Alcohol Use: Yes  . Drug Use: No  . Sexually Active: Not on file   Other Topics Concern  . Not on file   Social History Narrative  . No narrative on file    Current Outpatient Prescriptions on File Prior to Visit  Medication Sig Dispense Refill  . HYDROcodone-acetaminophen (NORCO) 7.5-325 MG per tablet Take 1 tablet by mouth daily as needed.  30 tablet  5  . levothyroxine (SYNTHROID, LEVOTHROID) 150 MCG tablet Take 1 tablet (150 mcg total) by mouth daily.  30 tablet  11   No current facility-administered medications on file prior to visit.    Allergies  Allergen Reactions  . Doxycycline   . Sulfonamide Derivatives     Family History  Problem Relation Age of Onset  . Cancer Maternal Grandmother   . Hypertension Other   . Stroke Other     BP 130/74  Pulse 94  Wt 162 lb (73.483 kg)  BMI 27.39 kg/m2  SpO2 96%  Review of Systems Denies tremor and excessive diaphoresis.    Objective:   Physical Exam VITAL SIGNS:   See vs page GENERAL: no distress Neck: a healed scar is present.  i do not appreciate a nodule in the thyroid or elsewhere in the neck   outside test results are reviewed: TSH=0.14 TG=undetectable    Assessment & Plan:  thyroid cancer, no evidence of recurrence Postsurgical hypothyroidism, appropriately replaced

## 2013-02-16 ENCOUNTER — Telehealth: Payer: Self-pay | Admitting: *Deleted

## 2013-02-16 MED ORDER — LEVOTHYROXINE SODIUM 150 MCG PO TABS: 150.0000 ug | ORAL_TABLET | Freq: Every day | ORAL | Status: DC

## 2013-02-16 NOTE — Telephone Encounter (Signed)
Pt called for a refill of her synthroid. Rx for 1 time with 0 refills sent to pt's pharmacy. Note included 'pt needs to schedule follow up appt for further refills.' Pt asked if she can go in the tanning bed. Please advise.

## 2013-02-16 NOTE — Telephone Encounter (Signed)
ok 

## 2013-02-17 NOTE — Telephone Encounter (Signed)
Pt advised.

## 2013-05-04 ENCOUNTER — Other Ambulatory Visit (INDEPENDENT_AMBULATORY_CARE_PROVIDER_SITE_OTHER): Payer: BC Managed Care – PPO

## 2013-05-04 ENCOUNTER — Ambulatory Visit (INDEPENDENT_AMBULATORY_CARE_PROVIDER_SITE_OTHER): Payer: BC Managed Care – PPO | Admitting: Internal Medicine

## 2013-05-04 ENCOUNTER — Encounter: Payer: Self-pay | Admitting: Internal Medicine

## 2013-05-04 VITALS — BP 122/88 | HR 81 | Temp 98.1°F | Ht 64.5 in | Wt 155.1 lb

## 2013-05-04 DIAGNOSIS — M549 Dorsalgia, unspecified: Secondary | ICD-10-CM

## 2013-05-04 DIAGNOSIS — E039 Hypothyroidism, unspecified: Secondary | ICD-10-CM

## 2013-05-04 DIAGNOSIS — Z0000 Encounter for general adult medical examination without abnormal findings: Secondary | ICD-10-CM

## 2013-05-04 LAB — HEPATIC FUNCTION PANEL
AST: 18 U/L (ref 0–37)
Albumin: 3.9 g/dL (ref 3.5–5.2)
Alkaline Phosphatase: 82 U/L (ref 39–117)
Total Protein: 7.9 g/dL (ref 6.0–8.3)

## 2013-05-04 LAB — BASIC METABOLIC PANEL
CO2: 28 mEq/L (ref 19–32)
Calcium: 9 mg/dL (ref 8.4–10.5)
Glucose, Bld: 88 mg/dL (ref 70–99)
Potassium: 3.9 mEq/L (ref 3.5–5.1)
Sodium: 138 mEq/L (ref 135–145)

## 2013-05-04 LAB — LIPID PANEL
Cholesterol: 185 mg/dL (ref 0–200)
HDL: 59.5 mg/dL (ref >39.00)

## 2013-05-04 LAB — URINALYSIS, ROUTINE W REFLEX MICROSCOPIC
Leukocytes, UA: NEGATIVE
Specific Gravity, Urine: <=1.005 (ref 1.000–1.030)
Urobilinogen, UA: 0.2 (ref 0.0–1.0)

## 2013-05-04 LAB — TSH: TSH: 0.12 u[IU]/mL — ABNORMAL LOW (ref 0.35–5.50)

## 2013-05-04 MED ORDER — HYDROCODONE-ACETAMINOPHEN 7.5-325 MG PO TABS: 1.0000 | ORAL_TABLET | Freq: Every day | ORAL | Status: DC | PRN

## 2013-05-04 NOTE — Progress Notes (Signed)
Subjective:    Patient ID: [name], female    DOB: 1966-04-14, 46 y.o.   MRN: [id number]  HPI  Here for wellness and f/u;  Overall doing ok;  Pt denies CP, worsening SOB, DOE, wheezing, orthopnea, PND, worsening LE edema, palpitations, dizziness or syncope.  Pt denies neurological change such as new headache, facial or extremity weakness.  Pt denies polydipsia, polyuria, or low sugar symptoms. Pt states overall good compliance with treatment and medications, good tolerability, and has been trying to follow lower cholesterol diet.  Pt denies worsening depressive symptoms, suicidal ideation or panic. No fever, night sweats, wt loss, loss of appetite, or other constitutional symptoms.  Pt states good ability with ADL's, has low fall risk, home safety reviewed and adequate, no other significant changes in hearing or vision, and only occasionally active with exercise. No acute complaints Pt continues to have recurring LBP without change in severity, bowel or bladder change, fever, wt loss,  worsening LE pain/numbness/weakness, gait change or falls.Sees endo on regular basis for f/u thyroid ca Past Medical History  Diagnosis Date  . HYPERLIPIDEMIA   . DEPRESSION   . ANXIETY   . ALLERGIC RHINITIS   . Thyroid carcinoma    Past Surgical History  Procedure Laterality Date  . Tubal ligation      reports that she has been smoking Cigarettes.  She has a 16 pack-year smoking history. She does not have any smokeless tobacco history on file. She reports that  drinks alcohol. She reports that she does not use illicit drugs. family history includes Cancer in her maternal grandmother; Hypertension in her other; and Stroke in her other. Allergies  Allergen Reactions  . Doxycycline   . Sulfonamide Derivatives    Current Outpatient Prescriptions on File Prior to Visit  Medication Sig Dispense Refill  . HYDROcodone-acetaminophen (NORCO) 7.5-325 MG per tablet Take 1 tablet by mouth daily as needed.  30  tablet  5  . levothyroxine (SYNTHROID, LEVOTHROID) 150 MCG tablet Take 1 tablet (150 mcg total) by mouth daily.  30 tablet  0   No current facility-administered medications on file prior to visit.   Review of Systems Constitutional: Negative for diaphoresis, activity change, appetite change or unexpected weight change.  HENT: Negative for hearing loss, ear pain, facial swelling, mouth sores and neck stiffness.   Eyes: Negative for pain, redness and visual disturbance.  Respiratory: Negative for shortness of breath and wheezing.   Cardiovascular: Negative for chest pain and palpitations.  Gastrointestinal: Negative for diarrhea, blood in stool, abdominal distention or other pain Genitourinary: Negative for hematuria, flank pain or change in urine volume.  Musculoskeletal: Negative for myalgias and joint swelling.  Skin: Negative for color change and wound.  Neurological: Negative for syncope and numbness. other than noted Hematological: Negative for adenopathy.  Psychiatric/Behavioral: Negative for hallucinations, self-injury, decreased concentration and agitation.      Objective:   Physical Exam BP 122/88  Pulse 81  Temp(Src) 98.1 F (36.7 C) (Oral)  Ht 5' 4.5" (1.638 m)  Wt 155 lb 2 oz (70.364 kg)  BMI 26.23 kg/m2  SpO2 98% VS noted,  Constitutional: Pt is oriented to person, place, and time. Appears well-developed and well-nourished.  Head: Normocephalic and atraumatic.  Right Ear: External ear normal.  Left Ear: External ear normal.  Nose: Nose normal.  Mouth/Throat: Oropharynx is clear and moist.  Eyes: Conjunctivae and EOM are normal. Pupils are equal, round, and reactive to light.  Neck: Normal range of motion. Neck  supple. No JVD present. No tracheal deviation present.  Cardiovascular: Normal rate, regular rhythm, normal heart sounds and intact distal pulses.   Pulmonary/Chest: Effort normal and breath sounds normal.  Abdominal: Soft. Bowel sounds are normal. There is  no tenderness. No HSM  Musculoskeletal: Normal range of motion. Exhibits no edema.  Lymphadenopathy:  Has no cervical adenopathy.  Neurological: Pt is alert and oriented to person, place, and time. Pt has normal reflexes. No cranial nerve deficit.  Skin: Skin is warm and dry. No rash noted.  Psychiatric:  Has  normal mood and affect. Behavior is normal.       Assessment & Plan:

## 2013-05-04 NOTE — Assessment & Plan Note (Signed)

## 2013-05-04 NOTE — Assessment & Plan Note (Signed)
stable overall by history and exam, and pt to continue medical treatment as before,  to f/u any worsening symptoms or concerns 

## 2013-05-04 NOTE — Patient Instructions (Signed)
Please continue all other medications as before, and refills have been done if requested.  Please keep your appointments with your specialists as you have planned  Please continue your efforts at being more active, low cholesterol diet, and weight control.  You are otherwise up to date with prevention measures today.  Please go to the LAB in the Basement (turn left off the elevator) for the tests to be done today  You will be contacted by phone if any changes need to be made immediately.  Otherwise, you will receive a letter about your results with an explanation, but please check with MyChart first.  Please remember to sign up for My Chart if you have not done so, as this will be important to you in the future with finding out test results, communicating by private email, and scheduling acute appointments online when needed.  Please keep your appointments with your specialists as you have planned - Dr Everardo All  Please return in 1 year for your yearly visit, or sooner if needed, with Lab testing done 3-5 days before

## 2013-05-05 ENCOUNTER — Encounter: Payer: BC Managed Care – PPO | Admitting: Internal Medicine

## 2013-05-05 LAB — CBC WITH DIFFERENTIAL/PLATELET
Basophils Absolute: 0.1 10*3/uL (ref 0.0–0.1)
Lymphocytes Relative: 26.1 % (ref 12.0–46.0)
Monocytes Relative: 7.6 % (ref 3.0–12.0)
Platelets: 353 10*3/uL (ref 150.0–400.0)
RDW: 13.4 % (ref 11.5–14.6)

## 2013-06-21 ENCOUNTER — Other Ambulatory Visit: Payer: Self-pay

## 2013-06-21 MED ORDER — LEVOTHYROXINE SODIUM 150 MCG PO TABS: 150.0000 ug | ORAL_TABLET | Freq: Every day | ORAL | Status: DC

## 2013-07-19 ENCOUNTER — Other Ambulatory Visit: Payer: Self-pay

## 2013-07-24 ENCOUNTER — Telehealth: Payer: Self-pay | Admitting: Endocrinology

## 2013-07-24 MED ORDER — LEVOTHYROXINE SODIUM 150 MCG PO TABS: 150.0000 ug | ORAL_TABLET | Freq: Every day | ORAL | Status: DC

## 2013-08-21 ENCOUNTER — Other Ambulatory Visit: Payer: Self-pay

## 2013-08-21 MED ORDER — LEVOTHYROXINE SODIUM 150 MCG PO TABS: 150.0000 ug | ORAL_TABLET | Freq: Every day | ORAL | Status: DC

## 2013-08-24 ENCOUNTER — Other Ambulatory Visit: Payer: Self-pay | Admitting: Obstetrics and Gynecology

## 2013-08-24 ENCOUNTER — Telehealth: Payer: Self-pay | Admitting: Obstetrics and Gynecology

## 2013-08-24 DIAGNOSIS — Z1239 Encounter for other screening for malignant neoplasm of breast: Secondary | ICD-10-CM

## 2013-08-24 NOTE — Telephone Encounter (Signed)
Patient is a previous patient of Dr. Edward Jolly. She called to ask if she was able to have her MMG completed in our office. I explained that Dr. Edward Jolly will place an order/referral for her and she will have [facility]. Patient said she had her last MMG November 2013. Dr. Edward Jolly, will you place an order for a MMG and can we get her scheduled?

## 2013-08-24 NOTE — Telephone Encounter (Signed)
Order placed. Patient call now call the [facility] and make her appointment.  Thanks!

## 2013-08-25 NOTE — Telephone Encounter (Signed)
Called patient and advised that order has been placed. Instructed patient to call the [facility] to schedule her yearly MMG when she is ready. Provided the patient with the contact information for the [facility]. Patient agreeable.

## 2013-09-20 ENCOUNTER — Other Ambulatory Visit: Payer: Self-pay

## 2013-09-20 ENCOUNTER — Telehealth: Payer: Self-pay | Admitting: Endocrinology

## 2013-09-20 ENCOUNTER — Ambulatory Visit
Admission: RE | Admit: 2013-09-20 | Discharge: 2013-09-20 | Disposition: A | Discharge status: Home / Self Care | Payer: BC Managed Care – PPO | Source: Ambulatory Visit | Attending: Obstetrics and Gynecology | Admitting: Obstetrics and Gynecology

## 2013-09-20 DIAGNOSIS — Z1239 Encounter for other screening for malignant neoplasm of breast: Secondary | ICD-10-CM

## 2013-09-20 MED ORDER — LEVOTHYROXINE SODIUM 150 MCG PO TABS: 150.0000 ug | ORAL_TABLET | Freq: Every day | ORAL | Status: DC

## 2013-09-20 NOTE — Telephone Encounter (Signed)
Refill sent.

## 2013-09-22 LAB — HM MAMMOGRAPHY

## 2013-09-28 ENCOUNTER — Encounter: Payer: Self-pay | Admitting: Obstetrics and Gynecology

## 2013-09-28 ENCOUNTER — Ambulatory Visit (INDEPENDENT_AMBULATORY_CARE_PROVIDER_SITE_OTHER): Payer: BC Managed Care – PPO | Admitting: Obstetrics and Gynecology

## 2013-09-28 VITALS — BP 116/78 | HR 80 | Resp 12 | Ht 65.0 in | Wt 157.0 lb

## 2013-09-28 DIAGNOSIS — Z01419 Encounter for gynecological examination (general) (routine) without abnormal findings: Secondary | ICD-10-CM

## 2013-09-28 DIAGNOSIS — Z23 Encounter for immunization: Secondary | ICD-10-CM

## 2013-09-28 DIAGNOSIS — Z0000 Encounter for general adult medical examination without abnormal findings: Secondary | ICD-10-CM

## 2013-09-28 NOTE — Progress Notes (Signed)
GYNECOLOGY VISIT  PCP: Oliver Barre, MD  Referring provider:   HPI: 47 y.o.   Married  Caucasian  female   G1P1001 with Patient's last menstrual period was 08/19/2013.   here for  Annual exam. Heavy menses occurring once a month. Last 6 - 7 days.  Changes every 1 - 1 1/2 hours at worst.  No pain.  Does not want to take any hormonal treatment.   Patient had thyroid removal in 2009 or 2010 - Dr. Narda Bonds. Treated for thyroid cancer with thyroidectomy and radiation therapy.   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   Hgb:  14.2 Urine:  PCP  GYNECOLOGIC HISTORY: Patient's last menstrual period was 08/19/2013. Sexually active:  Yes  Partner preference: Female Contraception:   Tubal  Menopausal hormone therapy: none DES exposure:  no Blood transfusions:  no   Sexually transmitted diseases:   HSV.  Rare outbreaks. GYN Procedures:  No  Mammogram:  09/20/13            Pap: 09/2012 Normal History of abnormal pap smear:   Yes.  Had a colposcopy with biopsy, no treatment.  - maybe 10 years ago.    OB History   Grav Para Term Preterm Abortions TAB SAB Ect Mult Living   1 1 1       1        LIFESTYLE: Exercise: not regularly  Tobacco:  6 cigs/qd Alcohol: 4 drinks/wk Drug use:  no  OTHER HEALTH MAINTENANCE: Tetanus/TDap: within 10 years Gardisil: n/a Influenza:  10/14  Zostavax: no  Bone density: none Colonoscopy: none  Cholesterol check: normal   Family History  Problem Relation Age of Onset  . Cancer Maternal Grandmother   . Hypertension Other   . Stroke Other   . Breast cancer Mother     5-6 years ago  . Breast cancer Paternal Grandmother     Patient Active Problem List   Diagnosis Date Noted  . Malignant neoplasm of thyroid gland 09/07/2011  . 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 Hypocalcemia 08/31/2011  . Preventative health care 03/17/2011  . BACK PAIN 06/26/2009  . LUMBAR  RADICULOPATHY, RIGHT 08/21/2008  . PARESTHESIA 07/30/2008  . HYPERLIPIDEMIA 07/18/2008  . ANXIETY 07/18/2008  . DEPRESSION 07/18/2008  . ALLERGIC RHINITIS 07/18/2008   Past Medical History  Diagnosis Date  . HYPERLIPIDEMIA   . DEPRESSION   . ANXIETY   . ALLERGIC RHINITIS   . Thyroid carcinoma     Past Surgical History  Procedure Laterality Date  . Tubal ligation    . Total thyroidectomy  2009/2010  . Cesarean section      ALLERGIES: Doxycycline and Sulfonamide derivatives  Current Outpatient Prescriptions  Medication Sig Dispense Refill  . levothyroxine (SYNTHROID, LEVOTHROID) 150 MCG tablet Take 1 tablet (150 mcg total) by mouth daily.  30 tablet  3  . HYDROcodone-acetaminophen (NORCO) 7.5-325 MG per tablet Take 1 tablet by mouth daily as needed.  30 tablet  5   No current facility-administered medications for this visit.     ROS:  Pertinent items are noted in HPI.  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   PHYSICAL EXAMINATION:    BP 116/78  Pulse 80  Resp 12  Ht 5\' 5"  (1.651 m)  Wt 157 lb (71.215 kg)  BMI 26.13 kg/m2  LMP 08/19/2013   Wt Readings from Last 3 Encounters:  09/28/13 157 lb (71.215 kg)  05/04/13 155 lb 2 oz (70.364 kg)  01/03/13 162 lb (73.483 kg)     Ht Readings from Last 3 Encounters:  09/28/13 5\' 5"  (1.651 m)  05/04/13 5' 4.5" (1.638 m)  05/10/12 5' 4.5" (1.638 m)    General appearance: alert, cooperative and appears stated age Head: Normocephalic, without obvious abnormality, atraumatic Neck: no adenopathy, supple, symmetrical, trachea midline and thyroid not enlarged, symmetric, no tenderness/mass/nodules Lungs: clear to auscultation bilaterally Breasts: Inspection negative, No nipple retraction or dimpling, No nipple discharge or bleeding, No axillary or supraclavicular adenopathy, Normal to palpation without dominant masses Heart: regular rate and rhythm Abdomen: Pfannenstiel incision, soft, non-tender; no masses,  no  organomegaly Extremities: extremities normal, atraumatic, no cyanosis or edema Skin: Skin color, texture, turgor normal. No rashes or lesions.  Multiple pigmented areas noted (freckles).  Lymph nodes: Cervical, supraclavicular, and axillary nodes normal. No abnormal inguinal nodes palpated Neurologic: Grossly normal  Pelvic: External genitalia:  no lesions              Urethra:  normal appearing urethra with no masses, tenderness or lesions              Bartholins and Skenes: normal                 Vagina: normal appearing vagina with normal color and discharge, no lesions              Cervix: normal appearance              Pap and high risk HPV testing done: yes.            Bimanual Exam:  Uterus:  uterus is normal size, shape, consistency and nontender                                      Adnexa: normal adnexa in size, nontender and no masses                                      Rectovaginal: Confirms                                      Anus:  normal sphincter tone, no lesions  ASSESSMENT  Normal gynecologic exam. Menorrhagia. Remote history of abnormal pap.  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 History of thyroid cancer.  Smoker.  Family history of breast cancer.  Multiple pigmented areas of skin.  PLAN  Mammogram yearly.  Pap smear and high risk HPV testing I discussed briefly endometrial ablation as a treatment for heavy menstrual bleeding.   Patient in the mean time will try NSAID therapy, either Motrin or Aleve to reduce flow.  Counseled on smoking cessation.  Counseled to see her ENT again and request removal of the HPV lesion.  TDap today.  I recommend patient see a dermatologist to have routine skin checks.      An After Visit Summary was printed and given to the patient.

## 2013-09-28 NOTE — Patient Instructions (Signed)
EXERCISE AND DIET:  We recommended that you start or continue a regular exercise program for good health. Regular exercise means any activity that makes your heart beat faster and makes you sweat.  We recommend exercising at least 30 minutes per day at least 3 days a week, preferably 4 or 5.  We also recommend a diet low in fat and sugar.  Inactivity, poor dietary choices and obesity can cause diabetes, heart attack, stroke, and kidney damage, among others.    ALCOHOL AND SMOKING:  Women should limit their alcohol intake to no more than 7 drinks/beers/glasses of wine (combined, not each!) per week. Moderation of alcohol intake to this level decreases your risk of breast cancer and liver damage. And of course, no recreational drugs are part of a healthy lifestyle.  And absolutely no smoking or even second hand smoke. Most people know smoking can cause heart and lung diseases, but did you know it also contributes to weakening of your bones? Aging of your skin?  Yellowing of your teeth and nails?  CALCIUM AND VITAMIN D:  Adequate intake of calcium and Vitamin D are recommended.  The recommendations for exact amounts of these supplements seem to change often, but generally speaking 600 mg of calcium (either carbonate or citrate) and 800 units of Vitamin D per day seems prudent. Certain women may benefit from higher intake of Vitamin D.  If you are among these women, your doctor will have told you during your visit.    PAP SMEARS:  Pap smears, to check for cervical cancer or precancers,  have traditionally been done yearly, although recent scientific advances have shown that most women can have pap smears less often.  However, every woman still should have a physical exam from her gynecologist every year. It will include a breast check, inspection of the vulva and vagina to check for abnormal growths or skin changes, a visual exam of the cervix, and then an exam to evaluate the size and shape of the uterus and  ovaries.  And after 46 years of age, a rectal exam is indicated to check for rectal cancers. We will also provide age appropriate advice regarding health maintenance, like when you should have certain vaccines, screening for sexually transmitted diseases, bone density testing, colonoscopy, mammograms, etc.   MAMMOGRAMS:  All women over 40 years old should have a yearly mammogram. Many facilities now offer a "3D" mammogram, which may cost around $50 extra out of pocket. If possible,  we recommend you accept the option to have the 3D mammogram performed.  It both reduces the number of women who will be called back for extra views which then turn out to be normal, and it is better than the routine mammogram at detecting truly abnormal areas.    COLONOSCOPY:  Colonoscopy to screen for colon cancer is recommended for all women at age 50.  We know, you hate the idea of the prep.  We agree, BUT, having colon cancer and not knowing it is worse!!  Colon cancer so often starts as a polyp that can be seen and removed at colonscopy, which can quite literally save your life!  And if your first colonoscopy is normal and you have no family history of colon cancer, most women don't have to have it again for 10 years.  Once every ten years, you can do something that may end up saving your life, right?  We will be happy to help you get it scheduled when you are ready.    Be sure to check your insurance coverage so you understand how much it will cost.  It may be covered as a preventative service at no cost, but you should check your particular policy.     Endometrial Ablation Endometrial ablation removes the lining of the uterus (endometrium). It is usually a same-day, outpatient treatment. Ablation helps avoid major surgery, such as surgery to remove the cervix and uterus (hysterectomy). After endometrial ablation, you will have little or no menstrual bleeding and may not be able to have children. However, if you are  premenopausal, you will need to use a reliable method of birth control following the procedure because of the small chance that pregnancy can occur. There are different reasons to have this procedure, which include:  Heavy periods.  Bleeding that is causing anemia.  Irregular bleeding.  Bleeding fibroids on the lining inside the uterus if they are smaller than 3 centimeters. This procedure should not be done if:  You want children in the future.  You have severe cramps with your menstrual period.  You have precancerous or cancerous cells in your uterus.  You were recently pregnant.  You have gone through menopause.  You have had major surgery on the uterus, such as a cesarean delivery. LET [facility] CARE PROVIDER KNOW ABOUT:  Any allergies you have.  All medicines you are taking, including vitamins, herbs, eye drops, creams, and over-the-counter medicines.  Previous problems you or members of your family have had with the use of anesthetics.  Any blood disorders you have.  Previous surgeries you have had.  Medical conditions you have. RISKS AND COMPLICATIONS  Generally, this is a safe procedure. However, as with any procedure, complications can occur. Possible complications include:  Perforation of the uterus.  Bleeding.  Infection of the uterus, bladder, or vagina.  Injury to surrounding organs.  An air bubble to the lung (air embolus).  Pregnancy following the procedure.  Failure of the procedure to help the problem, requiring hysterectomy.  Decreased ability to diagnose cancer in the lining of the uterus. BEFORE THE PROCEDURE  The lining of the uterus must be tested to make sure there is no pre-cancerous or cancer cells present.  An ultrasound may be performed to look at the size of the uterus and to check for abnormalities.  Medicines may be given to thin the lining of the uterus. PROCEDURE  During the procedure, your health care provider will use a  tool called a resectoscope to help see inside your uterus. There are different ways to remove the lining of your uterus.   Radiofrequency  This method uses a radiofrequency-alternating electric current to remove the lining of the uterus.  Cryotherapy This method uses extreme cold to freeze the lining of the uterus.  Heated-Free Liquid  This method uses heated salt (saline) solution to remove the lining of the uterus.  Microwave This method uses high-energy microwaves to heat up the lining of the uterus to remove it.  Thermal balloon  This method involves inserting a catheter with a balloon tip into the uterus. The balloon tip is filled with heated fluid to remove the lining of the uterus. AFTER THE PROCEDURE  After your procedure, do not have sexual intercourse or insert anything into your vagina until permitted by your health care provider. After the procedure, you may experience:  Cramps.  Vaginal discharge.  Frequent urination. Document Released: 09/11/2004 Document Revised: 07/05/2013 Document Reviewed: 04/05/2013 [facility] Patient Information 2014 [place], [place].

## 2013-10-03 ENCOUNTER — Encounter: Payer: Self-pay | Admitting: Obstetrics and Gynecology

## 2013-10-06 ENCOUNTER — Other Ambulatory Visit: Payer: Self-pay | Admitting: Obstetrics and Gynecology

## 2013-10-06 ENCOUNTER — Telehealth: Payer: Self-pay | Admitting: Obstetrics and Gynecology

## 2013-10-06 ENCOUNTER — Encounter: Payer: Self-pay | Admitting: Internal Medicine

## 2013-10-06 ENCOUNTER — Encounter: Payer: Self-pay | Admitting: Obstetrics and Gynecology

## 2013-10-06 DIAGNOSIS — R87620 Atypical squamous cells of undetermined significance on cytologic smear of vagina (ASC-US): Secondary | ICD-10-CM

## 2013-10-06 DIAGNOSIS — IMO0002 Reserved for concepts with insufficient information to code with codable children: Secondary | ICD-10-CM

## 2013-10-06 NOTE — Telephone Encounter (Signed)
Spoke with patient and message from Dr. Edward Jolly given regarding pap smear. Colposcopy scheduled. Patient has read emails from [facility] and is feeling better about results.   Colposcopy pre-procedure instructions given. Motrin instructions given. Motrin=Advil=Ibuprofen Can take 800 mg (Can purchase over the counter, you will need four 200 mg pills) every 8 hours as needed.  Take with food. Make sure to eat a meal before appointment and drink plenty of fluids. Patient verbalized understanding and will call to reschedule if will be on menses or has any concerns regarding pregnancy.

## 2013-10-06 NOTE — Telephone Encounter (Deleted)
Message copied by Joeseph Amor on Fri Oct 06, 2013 10:12 AM ------      Message from: Conley Simmonds      Created: Thu Oct 05, 2013  5:44 PM       French Ana,            Please contact my patient about her abnormal pap smear showing ASCUS and positive high risk HPV.            She has a history of an abnormal pap many year ago.            She will need a colposcopy with me.              I am sure that she will have questions about the HPV, and I will be happy to discuss this with her when she comes in for the colposcopy.             Thanks. ------

## 2013-10-06 NOTE — Telephone Encounter (Signed)
Message copied by Joeseph Amor on Fri Oct 06, 2013 10:11 AM ------      Message from: Conley Simmonds      Created: Thu Oct 05, 2013  5:44 PM       French Ana,            Please contact my patient about her abnormal pap smear showing ASCUS and positive high risk HPV.            She has a history of an abnormal pap many year ago.            She will need a colposcopy with me.              I am sure that she will have questions about the HPV, and I will be happy to discuss this with her when she comes in for the colposcopy.             Thanks. ------

## 2013-10-06 NOTE — Telephone Encounter (Signed)
Patient is concerned about a results from Abnormal pap. Wants to talk with someone.

## 2013-10-25 ENCOUNTER — Ambulatory Visit (INDEPENDENT_AMBULATORY_CARE_PROVIDER_SITE_OTHER): Payer: BC Managed Care – PPO | Admitting: Obstetrics and Gynecology

## 2013-10-25 ENCOUNTER — Encounter: Payer: Self-pay | Admitting: Obstetrics and Gynecology

## 2013-10-25 VITALS — BP 100/64 | HR 70 | Resp 18 | Ht 65.0 in | Wt 157.0 lb

## 2013-10-25 DIAGNOSIS — IMO0002 Reserved for concepts with insufficient information to code with codable children: Secondary | ICD-10-CM

## 2013-10-25 DIAGNOSIS — R6889 Other general symptoms and signs: Secondary | ICD-10-CM

## 2013-10-25 NOTE — Progress Notes (Signed)
Subjective:     Patient ID: [name], female   DOB: 11-03-1966, 47 y.o.   MRN: [id number]  HPI Here for colposcopy. Pap 09/28/13 showed ASCUS and positive HR HPV. History of abnormal pap and colpo many years ago.  No treatment.  LMP 10/05/13. Birth control tubal.  Saw ENT, Dr. Ezzard Standing and had tonsillar lesion removed which was HPV with no malignancy.   Patient is smoker and is trying to quit.   Review of Systems    See above. Objective:   Physical Exam  Genitourinary:        Colposcopy  Consent obtained.  Speculum placed in vagina. Acetic acid placed. Endocervical speculum used. Colposcopy satisfactory. No lesions noted. ECC performed and sent to pathology. Minimal EBL. Speculum removed.  Acetic acid gauze pads to the vulva. Area of potential scar noted in left perianal region.  No biopsy taken.  No complications to procedure.    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 Satisfactory colposcopy. History of HSV.  Area noted in perianal region is area of HSV outbreaks per patient.     Plan:     Follow up ECC. Discussion with patient regarding HPV, abnormal paps and LEEP procedure if treatment is necessary.

## 2013-10-25 NOTE — Patient Instructions (Signed)
Colposcopy Care After Colposcopy is a procedure in which a special tool is used to magnify the surface of the cervix. A tissue sample (biopsy) may also be taken. This sample will be looked at for cervical cancer or other problems. After the test:  You may have some cramping.  Lie down for a few minutes if you feel lightheaded.   You may have some bleeding which should stop in a few days. HOME CARE  Do not have sex or use tampons for 2 to 3 days or as told.  Only take medicine as told by your doctor.  Continue to take your birth control pills as usual. Finding out the results of your test Ask when your test results will be ready. Make sure you get your test results. GET HELP RIGHT AWAY IF:  You are bleeding a lot or are passing blood clots.  You develop a fever of 102 F (38.9 C) or higher.  You have abnormal vaginal discharge.  You have cramps that do not go away with medicine.  You feel lightheaded, dizzy, or pass out (faint). MAKE SURE YOU:   Understand these instructions.  Will watch your condition.  Will get help right away if you are not doing well or get worse. Document Released: 04/20/2008 Document Revised: 01/25/2012 Document Reviewed: 06/01/2013 [employer] Patient Information 2014 [employer].  

## 2013-10-26 ENCOUNTER — Encounter: Payer: Self-pay | Admitting: Obstetrics and Gynecology

## 2013-10-27 LAB — IPS CERVICAL/ECC/EMB/VULVAR/VAGINAL BIOPSY

## 2014-01-29 ENCOUNTER — Telehealth: Payer: Self-pay | Admitting: Endocrinology

## 2014-01-29 MED ORDER — LEVOTHYROXINE SODIUM 150 MCG PO TABS: 150.0000 ug | ORAL_TABLET | Freq: Every day | ORAL | Status: DC

## 2014-01-29 NOTE — Telephone Encounter (Signed)
Pt would like her thyroid Rx called in at [place]   Pt would also like to know if she is due for labs?  Call back:[phone]  Thank you :)

## 2014-01-29 NOTE — Telephone Encounter (Signed)
Medication refilled for a 30 day supply. Pt scheduled to come back for a follow up appointment on 01/30/2014.

## 2014-01-30 ENCOUNTER — Ambulatory Visit (INDEPENDENT_AMBULATORY_CARE_PROVIDER_SITE_OTHER): Payer: BC Managed Care – PPO | Admitting: Endocrinology

## 2014-01-30 ENCOUNTER — Encounter: Payer: Self-pay | Admitting: Endocrinology

## 2014-01-30 VITALS — BP 128/80 | HR 106 | Temp 98.7°F | Ht 65.0 in | Wt 156.0 lb

## 2014-01-30 DIAGNOSIS — C73 Malignant neoplasm of thyroid gland: Secondary | ICD-10-CM

## 2014-01-30 DIAGNOSIS — E890 Postprocedural hypothyroidism: Secondary | ICD-10-CM

## 2014-01-30 NOTE — Progress Notes (Signed)
   Subjective:    Patient ID: [name], female    DOB: Apr 30, 1966, 48 y.o.   MRN: [id number]  HPI Pt returns for f/u of thyroid cancer: She does not notice any nodule at the anterior neck.   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 10/12: adjuvant I-131 rx [facility]. 11/12: post-therapy scan: pos at right neck, nodes vs remnant 2/13: TG=2.3 (ab neg) 6/13 TG=0.7 (ab not done due to lab error) 2/14: TG undetectable (ab neg) Postsurgical hypothyroidism: pt states she feels well in general.  She takes synthroid as rx'ed. Postsurgical hypocalcemia: she has not required rx, and last Ca++ was normal.  She denies cramps.  Past Medical History  Diagnosis Date  . HYPERLIPIDEMIA   . DEPRESSION   . ANXIETY   . ALLERGIC RHINITIS   . Thyroid carcinoma     Past Surgical History  Procedure Laterality Date  . Tubal ligation    . Total thyroidectomy  2009/2010  . Cesarean section      History   Social History  . Marital Status: Married    Spouse Name: N/A    Number of Children: 1  . Years of Education: N/A   Occupational History  . customer service    Social History Main Topics  . Smoking status: Current Every Day Smoker -- 0.80 packs/day for 20 years    Types: Cigarettes  . Smokeless tobacco: Not on file  . Alcohol Use: 3.0 oz/week    6 drink(s) per week     Comment: 6 beers per week  . Drug Use: No  . Sexual Activity: Yes    Partners: Male    Birth Control/ Protection: Surgical     Comment: tubal   Other Topics Concern  . Not on file   Social History Narrative  . No narrative on file    Current Outpatient Prescriptions on File Prior to Visit  Medication Sig Dispense Refill  . HYDROcodone-acetaminophen (NORCO) 7.5-325 MG per tablet Take 1 tablet by mouth daily as needed.  30 tablet  5  . penicillin v potassium (VEETID) 250 MG tablet Take 250 mg by mouth 4 (four) times daily.       No current  facility-administered medications on file prior to visit.    Allergies  Allergen Reactions  . Doxycycline Nausea Only  . Sulfonamide Derivatives Itching    Family History  Problem Relation Age of Onset  . Cancer Maternal Grandmother   . Hypertension Other   . Stroke Other   . Breast cancer Mother     5-6 years ago  . Breast cancer Paternal Grandmother     BP 128/80  Pulse 106  Temp(Src) 98.7 F (37.1 C) (Oral)  Ht 5\' 5"  (1.651 m)  Wt 156 lb (70.761 kg)  BMI 25.96 kg/m2  SpO2 97%   Review of Systems Denies palpitations and weight cahnge    Objective:   Physical Exam VITAL SIGNS:  See vs page GENERAL: no distress Neck: a healed scar is present.  i do not appreciate a nodule in the thyroid or elsewhere in the neck.     Assessment & Plan:  Differentiated thyroid cancer, no clinical evidence of recurrence.  Postsurgical hypothyroidism, on synthroid.  Borderline case for normal vs slightly suppressed TSH Postsurgical hypocalcemia, mild: she has not required rx.

## 2014-01-30 NOTE — Patient Instructions (Addendum)
blood tests are being requested for you today.  We'll contact you with results.  Please come back for a follow-up appointment in 1 year.  

## 2014-01-31 ENCOUNTER — Other Ambulatory Visit: Payer: Self-pay | Admitting: *Deleted

## 2014-01-31 MED ORDER — LEVOTHYROXINE SODIUM 150 MCG PO TABS: 150.0000 ug | ORAL_TABLET | Freq: Every day | ORAL | Status: DC

## 2014-02-01 ENCOUNTER — Other Ambulatory Visit: Payer: Self-pay

## 2014-02-01 ENCOUNTER — Other Ambulatory Visit: Payer: Self-pay | Admitting: Endocrinology

## 2014-02-01 ENCOUNTER — Ambulatory Visit (INDEPENDENT_AMBULATORY_CARE_PROVIDER_SITE_OTHER): Payer: BC Managed Care – PPO

## 2014-02-01 DIAGNOSIS — R87811 Vaginal high risk human papillomavirus (HPV) DNA test positive: Secondary | ICD-10-CM

## 2014-02-01 DIAGNOSIS — R87620 Atypical squamous cells of undetermined significance on cytologic smear of vagina (ASC-US): Secondary | ICD-10-CM

## 2014-02-01 DIAGNOSIS — C73 Malignant neoplasm of thyroid gland: Secondary | ICD-10-CM

## 2014-02-01 DIAGNOSIS — E890 Postprocedural hypothyroidism: Secondary | ICD-10-CM

## 2014-02-01 LAB — TSH: TSH: 0.09 u[IU]/mL — ABNORMAL LOW (ref 0.35–5.50)

## 2014-02-01 MED ORDER — LEVOTHYROXINE SODIUM 137 MCG PO TABS: 137.0000 ug | ORAL_TABLET | Freq: Every day | ORAL | Status: DC

## 2014-02-02 ENCOUNTER — Other Ambulatory Visit: Payer: Self-pay | Admitting: Endocrinology

## 2014-02-02 LAB — PTH, INTACT AND CALCIUM

## 2014-02-02 LAB — THYROGLOBULIN ANTIBODY

## 2014-02-02 LAB — THYROGLOBULIN LEVEL: Thyroglobulin: <0.2 ng/mL (ref 0.0–55.0)

## 2014-02-07 ENCOUNTER — Encounter: Payer: Self-pay | Admitting: Endocrinology

## 2014-02-07 LAB — PTH, INTACT AND CALCIUM
Calcium: 8.9 mg/dL (ref 8.4–10.5)
PTH: 39.2 pg/mL (ref 14.0–72.0)

## 2014-03-08 ENCOUNTER — Telehealth: Payer: Self-pay | Admitting: Internal Medicine

## 2014-03-08 NOTE — Telephone Encounter (Signed)
Requesting hydrocodone for her back pain.  LOV June 2014.

## 2014-03-08 NOTE — Telephone Encounter (Signed)
Needs OV please as this is a controlled substance, and not routinely refilled for this pt

## 2014-03-09 NOTE — Telephone Encounter (Signed)
Appt on April 30.

## 2014-03-15 ENCOUNTER — Ambulatory Visit: Payer: BC Managed Care – PPO | Admitting: Internal Medicine

## 2014-05-17 ENCOUNTER — Telehealth: Payer: Self-pay

## 2014-05-17 ENCOUNTER — Other Ambulatory Visit: Payer: BC Managed Care – PPO

## 2014-05-17 ENCOUNTER — Encounter: Payer: Self-pay | Admitting: Internal Medicine

## 2014-05-17 ENCOUNTER — Ambulatory Visit (INDEPENDENT_AMBULATORY_CARE_PROVIDER_SITE_OTHER): Payer: BC Managed Care – PPO | Admitting: Internal Medicine

## 2014-05-17 VITALS — BP 120/82 | HR 88 | Temp 98.0°F | Ht 64.5 in | Wt 159.0 lb

## 2014-05-17 DIAGNOSIS — Z0000 Encounter for general adult medical examination without abnormal findings: Secondary | ICD-10-CM

## 2014-05-17 DIAGNOSIS — M549 Dorsalgia, unspecified: Secondary | ICD-10-CM

## 2014-05-17 MED ORDER — HYDROCODONE-ACETAMINOPHEN 7.5-325 MG PO TABS: 1.0000 | ORAL_TABLET | Freq: Every day | ORAL | Status: DC | PRN

## 2014-05-17 NOTE — Progress Notes (Signed)
Pre visit review using our clinic review tool, if applicable. No additional management support is needed unless otherwise documented below in the visit note. 

## 2014-05-17 NOTE — Telephone Encounter (Signed)
CPX labs entered  

## 2014-05-17 NOTE — Progress Notes (Signed)
Subjective:    Patient ID: [name], female    DOB: 04-16-1966, 48 y.o.   MRN: [id number]  HPI  Here for wellness and f/u;  Overall doing ok;  Pt denies CP, worsening SOB, DOE, wheezing, orthopnea, PND, worsening LE edema, palpitations, dizziness or syncope.  Pt denies neurological change such as new headache, facial or extremity weakness.  Pt denies polydipsia, polyuria, or low sugar symptoms. Pt states overall good compliance with treatment and medications, good tolerability, and has been trying to follow lower cholesterol diet.  Pt denies worsening depressive symptoms, suicidal ideation or panic. No fever, night sweats, wt loss, loss of appetite, or other constitutional symptoms.  Pt states good ability with ADL's, has low fall risk, home safety reviewed and adequate, no other significant changes in hearing or vision, and only occasionally active with exercise. Trying to quit smoking.  S/p thyroidetomy for early thyroid ca, sees Dr Rocco Pauls Past Medical History  Diagnosis Date  . HYPERLIPIDEMIA   . DEPRESSION   . ANXIETY   . ALLERGIC RHINITIS   . Thyroid carcinoma    Past Surgical History  Procedure Laterality Date  . Tubal ligation    . Total thyroidectomy  2009/2010  . Cesarean section      reports that she has been smoking Cigarettes.  She has a 16 pack-year smoking history. She does not have any smokeless tobacco history on file. She reports that she drinks about 3 ounces of alcohol per week. She reports that she does not use illicit drugs. family history includes Breast cancer in her mother and paternal grandmother; Cancer in her maternal grandmother; Hypertension in her other; Stroke in her other. Allergies  Allergen Reactions  . Doxycycline Nausea Only  . Sulfonamide Derivatives Itching   Current Outpatient Prescriptions on File Prior to Visit  Medication Sig Dispense Refill  . levothyroxine (SYNTHROID, LEVOTHROID) 137 MCG tablet Take 1 tablet (137 mcg total) by  mouth daily before breakfast.  30 tablet  11   No current facility-administered medications on file prior to visit.    Review of Systems Constitutional: Negative for increased diaphoresis, other activity, appetite or other siginficant weight change  HENT: Negative for worsening hearing loss, ear pain, facial swelling, mouth sores and neck stiffness.   Eyes: Negative for other worsening pain, redness or visual disturbance.  Respiratory: Negative for shortness of breath and wheezing.   Cardiovascular: Negative for chest pain and palpitations.  Gastrointestinal: Negative for diarrhea, blood in stool, abdominal distention or other pain Genitourinary: Negative for hematuria, flank pain or change in urine volume.  Musculoskeletal: Negative for myalgias or other joint complaints.  Skin: Negative for color change and wound.  Neurological: Negative for syncope and numbness. other than noted Hematological: Negative for adenopathy. or other swelling Psychiatric/Behavioral: Negative for hallucinations, self-injury, decreased concentration or other worsening agitation.      Objective:   Physical Exam BP 120/82  Pulse 88  Temp(Src) 98 F (36.7 C) (Oral)  Ht 5' 4.5" (1.638 m)  Wt 159 lb (72.122 kg)  BMI 26.88 kg/m2  SpO2 98% VS noted,  Constitutional: Pt is oriented to person, place, and time. Appears well-developed and well-nourished.  Head: Normocephalic and atraumatic.  Right Ear: External ear normal.  Left Ear: External ear normal.  Nose: Nose normal.  Mouth/Throat: Oropharynx is clear and moist.  Eyes: Conjunctivae and EOM are normal. Pupils are equal, round, and reactive to light.  Neck: Normal range of motion. Neck supple. No JVD present. No tracheal  deviation present.  Cardiovascular: Normal rate, regular rhythm, normal heart sounds and intact distal pulses.   Pulmonary/Chest: Effort normal and breath sounds without rales or wheezing  Abdominal: Soft. Bowel sounds are normal. NT. No  HSM  Musculoskeletal: Normal range of motion. Exhibits no edema.  Lymphadenopathy:  Has no cervical adenopathy.  Neurological: Pt is alert and oriented to person, place, and time. Pt has normal reflexes. No cranial nerve deficit. Motor grossly intact Skin: Skin is warm and dry. No rash noted.  Psychiatric:  Has normal mood and affect. Behavior is normal.     Assessment & Plan:

## 2014-05-17 NOTE — Assessment & Plan Note (Signed)

## 2014-05-17 NOTE — Patient Instructions (Signed)
Please continue all other medications as before, and refills have been done if requested.  Please have the pharmacy call with any other refills you may need.  Please continue your efforts at being more active, low cholesterol diet, and weight control.  You are otherwise up to date with prevention measures today.  Please keep your appointments with your specialists as you may have planned  Please stop smoking  Please go to the LAB in the Basement (turn left off the elevator) for the tests to be done today  You will be contacted by phone if any changes need to be made immediately.  Otherwise, you will receive a letter about your results with an explanation, but please check with MyChart first.  Please remember to sign up for MyChart if you have not done so, as this will be important to you in the future with finding out test results, communicating by private email, and scheduling acute appointments online when needed.  Please return in 1 year for your yearly visit, or sooner if needed, with Lab testing done 3-5 days before  

## 2014-05-18 NOTE — Assessment & Plan Note (Signed)
For hydrocodone refill for limited use, recurring LBP

## 2014-05-21 ENCOUNTER — Telehealth: Payer: Self-pay | Admitting: Internal Medicine

## 2014-05-21 ENCOUNTER — Other Ambulatory Visit (INDEPENDENT_AMBULATORY_CARE_PROVIDER_SITE_OTHER): Payer: BC Managed Care – PPO

## 2014-05-21 DIAGNOSIS — Z0000 Encounter for general adult medical examination without abnormal findings: Secondary | ICD-10-CM

## 2014-05-21 DIAGNOSIS — M549 Dorsalgia, unspecified: Secondary | ICD-10-CM

## 2014-05-21 LAB — URINALYSIS, ROUTINE W REFLEX MICROSCOPIC
BILIRUBIN URINE: NEGATIVE
KETONES UR: NEGATIVE
Leukocytes, UA: NEGATIVE
NITRITE: NEGATIVE
Specific Gravity, Urine: 1.01 (ref 1.000–1.030)
TOTAL PROTEIN, URINE-UPE24: NEGATIVE
Urine Glucose: NEGATIVE
Urobilinogen, UA: 0.2 (ref 0.0–1.0)
pH: 7 (ref 5.0–8.0)

## 2014-05-21 LAB — LIPID PANEL
Cholesterol: 206 mg/dL — ABNORMAL HIGH (ref 0–200)
HDL: 67.1 mg/dL (ref >39.00)
LDL Cholesterol: 125 mg/dL — ABNORMAL HIGH (ref 0–99)
NONHDL: 138.9
Total CHOL/HDL Ratio: 3
Triglycerides: 69 mg/dL (ref 0.0–149.0)
VLDL: 13.8 mg/dL (ref 0.0–40.0)

## 2014-05-21 LAB — CBC WITH DIFFERENTIAL/PLATELET
BASOS PCT: 1.2 % (ref 0.0–3.0)
Basophils Absolute: 0.1 10*3/uL (ref 0.0–0.1)
EOS ABS: 0.1 10*3/uL (ref 0.0–0.7)
Eosinophils Relative: 2.1 % (ref 0.0–5.0)
HCT: 36.8 % (ref 36.0–46.0)
Hemoglobin: 12.1 g/dL (ref 12.0–15.0)
LYMPHS ABS: 2.4 10*3/uL (ref 0.7–4.0)
Lymphocytes Relative: 36.3 % (ref 12.0–46.0)
MCHC: 32.8 g/dL (ref 30.0–36.0)
MCV: 87.2 fl (ref 78.0–100.0)
MONO ABS: 0.6 10*3/uL (ref 0.1–1.0)
Monocytes Relative: 9.2 % (ref 3.0–12.0)
Neutro Abs: 3.4 10*3/uL (ref 1.4–7.7)
Neutrophils Relative %: 51.2 % (ref 43.0–77.0)
PLATELETS: 383 10*3/uL (ref 150.0–400.0)
RBC: 4.22 Mil/uL (ref 3.87–5.11)
RDW: 15 % (ref 11.5–15.5)
WBC: 6.6 10*3/uL (ref 4.0–10.5)

## 2014-05-21 LAB — HEPATIC FUNCTION PANEL
ALT: 14 U/L (ref 0–35)
AST: 18 U/L (ref 0–37)
Albumin: 3.8 g/dL (ref 3.5–5.2)
Alkaline Phosphatase: 69 U/L (ref 39–117)
BILIRUBIN DIRECT: 0 mg/dL (ref 0.0–0.3)
TOTAL PROTEIN: 7.4 g/dL (ref 6.0–8.3)
Total Bilirubin: 0.4 mg/dL (ref 0.2–1.2)

## 2014-05-21 LAB — BASIC METABOLIC PANEL
BUN: 11 mg/dL (ref 6–23)
CALCIUM: 8.8 mg/dL (ref 8.4–10.5)
CHLORIDE: 104 meq/L (ref 96–112)
CO2: 26 meq/L (ref 19–32)
Creatinine, Ser: 0.5 mg/dL (ref 0.4–1.2)
GFR: 131.61 mL/min (ref >60.00)
Glucose, Bld: 97 mg/dL (ref 70–99)
Potassium: 3.8 mEq/L (ref 3.5–5.1)
Sodium: 136 mEq/L (ref 135–145)

## 2014-05-21 LAB — TSH: TSH: 0.48 u[IU]/mL (ref 0.35–4.50)

## 2014-05-21 NOTE — Telephone Encounter (Signed)
Relevant patient education assigned to patient using Emmi. ° °

## 2014-06-12 ENCOUNTER — Ambulatory Visit (INDEPENDENT_AMBULATORY_CARE_PROVIDER_SITE_OTHER): Payer: BC Managed Care – PPO | Admitting: Internal Medicine

## 2014-06-12 ENCOUNTER — Encounter: Payer: Self-pay | Admitting: Internal Medicine

## 2014-06-12 VITALS — BP 120/82 | HR 92 | Temp 98.5°F | Ht 64.5 in | Wt 156.1 lb

## 2014-06-12 DIAGNOSIS — F3289 Other specified depressive episodes: Secondary | ICD-10-CM

## 2014-06-12 DIAGNOSIS — G8929 Other chronic pain: Secondary | ICD-10-CM | POA: Insufficient documentation

## 2014-06-12 DIAGNOSIS — M545 Low back pain, unspecified: Secondary | ICD-10-CM

## 2014-06-12 DIAGNOSIS — F329 Major depressive disorder, single episode, unspecified: Secondary | ICD-10-CM
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
## 2014-06-12 NOTE — Progress Notes (Signed)
Pre visit review using our clinic review tool, if applicable. No additional management support is needed unless otherwise documented below in the visit note. 

## 2014-06-12 NOTE — Patient Instructions (Signed)
Please continue all other medications as before, and refills have been done if requested.  Please have the pharmacy call with any other refills you may need.  Please continue your efforts at being more active, low cholesterol diet, and weight control.  Please keep your appointments with your specialists as you may have planned  You will be contacted regarding the referral for: MRI lumbar spine, and orthopedic

## 2014-06-15 NOTE — Assessment & Plan Note (Addendum)
unfort now mod, persistent > 1 month with LE symptoms, will need MRI for worsening persistent pain and neuritic symptoms, cont pain control,  to f/u any worsening symptoms or concerns, also for ortho referral

## 2014-06-15 NOTE — Assessment & Plan Note (Signed)
stable overall by history and exam, and pt to continue medical treatment as before,  to f/u any worsening symptoms or concerns 

## 2014-06-28 ENCOUNTER — Ambulatory Visit
Admission: RE | Admit: 2014-06-28 | Discharge: 2014-06-28 | Disposition: A | Discharge status: Home / Self Care | Payer: BC Managed Care – PPO | Source: Ambulatory Visit | Attending: Internal Medicine | Admitting: Internal Medicine

## 2014-06-28 DIAGNOSIS — M545 Low back pain: Secondary | ICD-10-CM

## 2014-07-16 ENCOUNTER — Encounter: Payer: Self-pay | Admitting: Internal Medicine

## 2014-07-16 NOTE — Telephone Encounter (Signed)
robin to contact pt - ok to reiterate mychart message, in case she does not look at [employer]

## 2014-07-24 ENCOUNTER — Encounter: Payer: Self-pay | Admitting: Obstetrics and Gynecology

## 2014-09-05 ENCOUNTER — Ambulatory Visit (INDEPENDENT_AMBULATORY_CARE_PROVIDER_SITE_OTHER): Payer: BC Managed Care – PPO | Admitting: Internal Medicine

## 2014-09-05 ENCOUNTER — Encounter: Payer: Self-pay | Admitting: Internal Medicine

## 2014-09-05 ENCOUNTER — Ambulatory Visit (INDEPENDENT_AMBULATORY_CARE_PROVIDER_SITE_OTHER)
Admission: RE | Admit: 2014-09-05 | Discharge: 2014-09-05 | Disposition: A | Discharge status: Home / Self Care | Payer: BC Managed Care – PPO | Source: Ambulatory Visit | Attending: Internal Medicine | Admitting: Internal Medicine

## 2014-09-05 VITALS — BP 120/88 | HR 94 | Temp 98.1°F | Ht 64.5 in | Wt 162.5 lb

## 2014-09-05 DIAGNOSIS — R109 Unspecified abdominal pain: Secondary | ICD-10-CM | POA: Insufficient documentation

## 2014-09-05 DIAGNOSIS — R0789 Other chest pain: Secondary | ICD-10-CM

## 2014-09-05 DIAGNOSIS — R10A1 Flank pain, right side: Secondary | ICD-10-CM | POA: Insufficient documentation

## 2014-09-05 DIAGNOSIS — M5441 Lumbago with sciatica, right side: Secondary | ICD-10-CM

## 2014-09-05 MED ORDER — HYDROCODONE-ACETAMINOPHEN 7.5-325 MG PO TABS: 1.0000 | ORAL_TABLET | Freq: Every day | ORAL | Status: DC | PRN

## 2014-09-05 NOTE — Progress Notes (Signed)
Pre visit review using our clinic review tool, if applicable. No additional management support is needed unless otherwise documented below in the visit note. 

## 2014-09-05 NOTE — Patient Instructions (Addendum)
Please continue all other medications as before, and refills have been done if requested - the hydrocodone  Please have the pharmacy call with any other refills you may need.  Please continue your efforts at being more active, low cholesterol diet, and weight control.  Please keep your appointments with your specialists as you may have planned  Please go to the XRAY Department in the Basement (go straight as you get off the elevator) for the x-ray testing  Please go to the LAB in the Basement (turn left off the elevator) for the tests to be done - just the urine test on Friday later this week  You will be contacted by phone if any changes need to be made immediately.  Otherwise, you will receive a letter about your results with an explanation, but please check with MyChart first.  Please remember to sign up for MyChart if you have not done so, as this will be important to you in the future with finding out test results, communicating by private email, and scheduling acute appointments online when needed.

## 2014-09-05 NOTE — Progress Notes (Signed)
Subjective:    Patient ID: [name], female    DOB: 18-Jun-1966, 48 y.o.   MRN: [id number]  HPI  Here to f/u, c/o right flank pain/CP, mild to mod, intermittent, 2 wks, not assoc with n/v, fever, radiation, and Denies urinary symptoms such as dysuria, frequency, urgency, flank pain, hematuria or n/v, fever, chills.  Did do some lifting recently.  Denies worsening reflux, abd pain, dysphagia, n/v, bowel change or blood.  Pt denies chest pain, increased sob or doe, wheezing, orthopnea, PND, increased LE swelling, palpitations, dizziness or syncope.   Pt denies fever, wt loss, night sweats, loss of appetite, or other constitutional symptoms.  Pt continues to have recurring LBP without change in severity, bowel or bladder change, fever, wt loss,  worsening LE pain/numbness/weakness, gait change or falls. Past Medical History  Diagnosis Date  . HYPERLIPIDEMIA   . DEPRESSION   . ANXIETY   . ALLERGIC RHINITIS   . Thyroid carcinoma    Past Surgical History  Procedure Laterality Date  . Tubal ligation    . Total thyroidectomy  2009/2010  . Cesarean section      reports that she has been smoking Cigarettes.  She has a 16 pack-year smoking history. She does not have any smokeless tobacco history on file. She reports that she drinks about 3 ounces of alcohol per week. She reports that she does not use illicit drugs. family history includes Breast cancer in her mother and paternal grandmother; Cancer in her maternal grandmother; Hypertension in her other; Stroke in her other. Allergies  Allergen Reactions  . Doxycycline Nausea Only  . Sulfonamide Derivatives Itching   Current Outpatient Prescriptions on File Prior to Visit  Medication Sig Dispense Refill  . levothyroxine (SYNTHROID, LEVOTHROID) 137 MCG tablet Take 1 tablet (137 mcg total) by mouth daily before breakfast.  30 tablet  11   No current facility-administered medications on file prior to visit.    Review of Systems  Constitutional: Negative for unusual diaphoresis or other sweats  HENT: Negative for ringing in ear Eyes: Negative for double vision or worsening visual disturbance.  Respiratory: Negative for choking and stridor.   Gastrointestinal: Negative for vomiting or other signifcant bowel change Genitourinary: Negative for hematuria or decreased urine volume.  Musculoskeletal: Negative for other MSK pain or swelling Skin: Negative for color change and worsening wound.  Neurological: Negative for tremors and numbness other than noted  Psychiatric/Behavioral: Negative for decreased concentration or agitation other than above       Objective:   Physical Exam BP 120/88  Pulse 94  Temp(Src) 98.1 F (36.7 C) (Oral)  Ht 5' 4.5" (1.638 m)  Wt 162 lb 8 oz (73.71 kg)  BMI 27.47 kg/m2  SpO2 96%  LMP 09/02/2014 VS noted,  Constitutional: Pt appears well-developed, well-nourished.  HENT: Head: NCAT.  Right Ear: External ear normal.  Left Ear: External ear normal.  Eyes: . Pupils are equal, round, and reactive to light. Conjunctivae and EOM are normal Neck: Normal range of motion. Neck supple.  Cardiovascular: Normal rate and regular rhythm.   Pulmonary/Chest: Effort normal and breath sounds normal.  Abd:  Soft, NT, ND, + BS Neurological: Pt is alert. Not confused , motor grossly intact Skin: Skin is warm. No rash Psychiatric: Pt behavior is normal. No agitation.     Wt Readings from Last 3 Encounters:  09/05/14 162 lb 8 oz (73.71 kg)  06/12/14 156 lb 2 oz (70.818 kg)  05/17/14 159 lb (72.122 kg)  Assessment & Plan:

## 2014-09-06 ENCOUNTER — Telehealth: Payer: Self-pay | Admitting: Internal Medicine

## 2014-09-06 NOTE — Telephone Encounter (Signed)
emmi emailed °

## 2014-09-10 DIAGNOSIS — R0789 Other chest pain: Secondary | ICD-10-CM | POA: Insufficient documentation

## 2014-09-10 NOTE — Assessment & Plan Note (Signed)
?   MSK, cant r/o renal stone, for UA

## 2014-09-10 NOTE — Assessment & Plan Note (Signed)
stable overall by history and exam, recent data reviewed with pt, and pt to continue medical treatment as before,  to f/u any worsening symptoms or concerns, for med refill 

## 2014-09-10 NOTE — Assessment & Plan Note (Signed)
Also for cxr./ rib xray but ? msk vs even neuritic, exam benign.,  to f/u any worsening symptoms or concerns

## 2014-09-17 ENCOUNTER — Encounter: Payer: Self-pay | Admitting: Internal Medicine

## 2014-10-01 ENCOUNTER — Ambulatory Visit: Payer: BC Managed Care – PPO | Admitting: Obstetrics and Gynecology

## 2014-10-01 ENCOUNTER — Ambulatory Visit (INDEPENDENT_AMBULATORY_CARE_PROVIDER_SITE_OTHER): Payer: BC Managed Care – PPO | Admitting: Obstetrics and Gynecology

## 2014-10-01 ENCOUNTER — Encounter: Payer: Self-pay | Admitting: Obstetrics and Gynecology

## 2014-10-01 VITALS — BP 120/90 | HR 82 | Resp 12 | Ht 64.75 in | Wt 161.0 lb

## 2014-10-01 DIAGNOSIS — F5089 Other specified eating disorder: Secondary | ICD-10-CM

## 2014-10-01 DIAGNOSIS — F508 Other eating disorders: Secondary | ICD-10-CM

## 2014-10-01 DIAGNOSIS — N921 Excessive and frequent menstruation with irregular cycle: Secondary | ICD-10-CM

## 2014-10-01 DIAGNOSIS — Z01419 Encounter for gynecological examination (general) (routine) without abnormal findings: Secondary | ICD-10-CM

## 2014-10-01 DIAGNOSIS — Z0000 Encounter for general adult medical examination without abnormal findings: Secondary | ICD-10-CM

## 2014-10-01 LAB — POCT URINALYSIS DIPSTICK
LEUKOCYTES UA: NEGATIVE
Urobilinogen, UA: NEGATIVE
pH, UA: 5

## 2014-10-01 NOTE — Progress Notes (Signed)
48 y.o. G29P1001 Married CaucasianF here for annual exam.   Menses this month 11/4 - 11/10 and started 2 days later again.  Staining through pads and bed clothes. Clotting.  Wants help with bleeding.  Craving ice.   Notes ovulatory mucous.  Saw PCP and had blood in her urine there also.   Patient's last menstrual period was 09/19/2014.          Sexually active: Yes.    The current method of family planning is tubal ligation.    Exercising: No.  The patient does not participate in regular exercise at present. Smoker:  no  Health Maintenance: Pap:  09/28/13 ASCUS + HPV History of abnormal Pap:  Yes 09/28/13 Colpo bx done 10/25/13 - ECC benign. MMG:  09/20/13 Bi-Rads 1: Negative Colonoscopy: Never had one BMD:  Never had one  TDaP:  09/28/13  Screening Labs: Cathlean Cower, MD ;  Urine today: Blood ++   reports that she has been smoking Cigarettes.  She has a 16 pack-year smoking history. She does not have any smokeless tobacco history on file. She reports that she drinks about 3.0 oz of alcohol per week. She reports that she does not use illicit drugs.  Past Medical History  Diagnosis Date  . HYPERLIPIDEMIA   . DEPRESSION   . ANXIETY   . ALLERGIC RHINITIS   . Thyroid carcinoma     Past Surgical History  Procedure Laterality Date  . Tubal ligation    . Total thyroidectomy  2009/2010  . Cesarean section      Current Outpatient Prescriptions  Medication Sig Dispense Refill  . HYDROcodone-acetaminophen (NORCO) 7.5-325 MG per tablet Take 1 tablet by mouth daily as needed. 60 tablet 0  . levothyroxine (SYNTHROID, LEVOTHROID) 137 MCG tablet Take 1 tablet (137 mcg total) by mouth daily before breakfast. 30 tablet 11  . levothyroxine (SYNTHROID, LEVOTHROID) 150 MCG tablet Take 150 mcg by mouth.     No current facility-administered medications for this visit.    Family History  Problem Relation Age of Onset  . Cancer Maternal Grandmother   . Hypertension Other   . Stroke Other    . Breast cancer Mother     5-6 years ago  . Breast cancer Paternal Grandmother     ROS:  Pertinent items are noted in HPI.  Otherwise, a comprehensive ROS was negative.  Exam:   BP 120/90 mmHg  Pulse 82  Resp 12  Ht 5' 4.75" (1.645 m)  Wt 161 lb (73.029 kg)  BMI 26.99 kg/m2  LMP 09/19/2014     Height: 5' 4.75" (164.5 cm)  Ht Readings from Last 3 Encounters:  10/01/14 5' 4.75" (1.645 m)  09/05/14 5' 4.5" (1.638 m)  06/12/14 5' 4.5" (1.638 m)    General appearance: alert, cooperative and appears stated age Head: Normocephalic, without obvious abnormality, atraumatic Neck: no adenopathy, supple, symmetrical, trachea midline and thyroid normal to inspection and palpation Lungs: clear to auscultation bilaterally Breasts: normal appearance, no masses or tenderness, Inspection negative, No nipple retraction or dimpling, No nipple discharge or bleeding, No axillary or supraclavicular adenopathy Heart: regular rate and rhythm Abdomen: soft, non-tender; bowel sounds normal; no masses,  no organomegaly Extremities: extremities normal, atraumatic, no cyanosis or edema Skin: Skin color, texture, turgor normal. No rashes or lesions Lymph nodes: Cervical, supraclavicular, and axillary nodes normal. No abnormal inguinal nodes palpated Neurologic: Grossly normal   Pelvic: External genitalia:  no lesions  Urethra:  normal appearing urethra with no masses, tenderness or lesions              Bartholins and Skenes: normal                 Vagina: normal appearing vagina with normal color and discharge, no lesions              Cervix: no lesions.  Menstrual flow noted - light.               Pap taken: Yes.   Bimanual Exam:  Uterus:   uterus 10 week size with fundal enlargement - fibroid?              Adnexa: normal adnexa and no mass, fullness, tenderness               Rectovaginal: Confirms               Anus:  normal sphincter tone, no lesions  A:  Well Woman with normal  exam Hx ASCUS and positive HR HPV.  Menorrhagia with some irregular cycles.  I suspect a uterine fibroid.  Pica.  History of Cesarean Section.  History of hematuria or contamination from uterine bleeding. Status post thyroidectomy for thyroid cancer. TSH normal in July 2015.  P:   Mammogram yearly.  pap smear and HR HPV testing.  Return for pelvic ultrasound and possible EMB.   EMB discussed. May need paracervical block for this.  Will do CBC, iron and ferritin at ultrasound appointment.  return annually or prn  An After Visit Summary was printed and given to the patient.

## 2014-10-03 ENCOUNTER — Telehealth: Payer: Self-pay | Admitting: Obstetrics and Gynecology

## 2014-10-03 NOTE — Telephone Encounter (Signed)
Spoke with patient. Advised that per benefit quote received, she will be responsible for $30 copay when she comes in for PUS. °Patient agreeable. °Scheduled PUS. °Advised patient of 72 hour cancellation policy and $100 cancellation fee. Patient agreeable. °

## 2014-10-03 NOTE — Telephone Encounter (Signed)
Patient would like to reschedule her upcoming PUS appointment.

## 2014-10-03 NOTE — Telephone Encounter (Signed)
Returned call to patient. Patient wanted to reschedule for January. I spoke with [name] who stated that waiting until January was probably not a good idea. Patient states that she will keep appt as scheduled.

## 2014-10-04 ENCOUNTER — Encounter: Payer: Self-pay | Admitting: Obstetrics and Gynecology

## 2014-10-04 ENCOUNTER — Ambulatory Visit (INDEPENDENT_AMBULATORY_CARE_PROVIDER_SITE_OTHER): Payer: BC Managed Care – PPO | Admitting: Obstetrics and Gynecology

## 2014-10-04 ENCOUNTER — Ambulatory Visit (INDEPENDENT_AMBULATORY_CARE_PROVIDER_SITE_OTHER): Payer: BC Managed Care – PPO

## 2014-10-04 VITALS — BP 130/80 | HR 72 | Wt 162.0 lb

## 2014-10-04 DIAGNOSIS — F5089 Other specified eating disorder: Secondary | ICD-10-CM

## 2014-10-04 DIAGNOSIS — N921 Excessive and frequent menstruation with irregular cycle: Secondary | ICD-10-CM

## 2014-10-04 DIAGNOSIS — F508 Other eating disorders: Secondary | ICD-10-CM

## 2014-10-04 DIAGNOSIS — R9389 Abnormal findings on diagnostic imaging of other specified body structures: Secondary | ICD-10-CM

## 2014-10-04 DIAGNOSIS — R938 Abnormal findings on diagnostic imaging of other specified body structures: Secondary | ICD-10-CM

## 2014-10-04 LAB — IPS PAP TEST WITH HPV

## 2014-10-04 NOTE — Telephone Encounter (Signed)
Spoke with patient. Advised patient that I spoke with Dr.Silva who recommends that patient come in today to be seen for ultrasound. Patient is agreeable. Appointment scheduled for today 11/19 at 11:30am with 12pm consult with Dr.Silva. Patient is agreeable to date and time.  Routing to provider for final review. Patient agreeable to disposition. Will close encounter

## 2014-10-04 NOTE — Telephone Encounter (Signed)
Spoke with patient. Patient was seen on 11/16 with Dr.Silva. Patient states that two days ago her stomach began to "swell." "It is tight and feels like I am pregnant. I have been reading online and I hope nothing is wrong." Denies pain, nausea, fever, change in appetite and vomiting. States that she has had light bleeding. Patient is scheduled for PUS on 12/10. Advised patient will speak with Dr.Silva regarding symptoms and return call with further recommendations. Patient is agreeable. Patient is available today if she needs to be seen.

## 2014-10-04 NOTE — Patient Instructions (Signed)
Norethindrone tablets (contraception) What is this medicine? NORETHINDRONE (nor eth IN drone) is an oral contraceptive. The product contains a female hormone known as a progestin. It is used to prevent pregnancy. This medicine may be used for other purposes; ask your health care provider or pharmacist if you have questions. COMMON BRAND NAME(S): Camila, Deblitane 28-Day, Errin, Heather, [place], Jolivette, [place], Nor-QD, Nora-BE, Norlyroc, Ortho Micronor, [employer] 28-Day What should I tell my health care provider before I take this medicine? They need to know if you have any of these conditions: -blood vessel disease or blood clots -breast, cervical, or vaginal cancer -diabetes -heart disease -kidney disease -liver disease -mental depression -migraine -seizures -stroke -vaginal bleeding -an unusual or allergic reaction to norethindrone, other medicines, foods, dyes, or preservatives -pregnant or trying to get pregnant -breast-feeding How should I use this medicine? Take this medicine by mouth with a glass of water. You may take it with or without food. Follow the directions on the prescription label. Take this medicine at the same time each day and in the order directed on the package. Do not take your medicine more often than directed. Contact your pediatrician regarding the use of this medicine in children. Special care may be needed. This medicine has been used in female children who have started having menstrual periods. A patient package insert for the product will be given with each prescription and refill. Read this sheet carefully each time. The sheet may change frequently. Overdosage: If you think you have taken too much of this medicine contact a poison control center or emergency room at once. NOTE: This medicine is only for you. Do not share this medicine with others. What if I miss a dose? Try not to miss a dose. Every time you miss a dose or take a dose late your chance of  pregnancy increases. When 1 pill is missed (even if only 3 hours late), take the missed pill as soon as possible and continue taking a pill each day at the regular time (use a back up method of birth control for the next 48 hours). If more than 1 dose is missed, use an additional birth control method for the rest of your pill pack until menses occurs. Contact your health care professional if more than 1 dose has been missed. What may interact with this medicine? Do not take this medicine with any of the following medications: -amprenavir or fosamprenavir -bosentan This medicine may also interact with the following medications: -antibiotics or medicines for infections, especially rifampin, rifabutin, rifapentine, and griseofulvin, and possibly penicillins or tetracyclines -aprepitant -barbiturate medicines, such as phenobarbital -carbamazepine -felbamate -modafinil -oxcarbazepine -phenytoin -ritonavir or other medicines for HIV infection or AIDS -St. John's wort -topiramate This list may not describe all possible interactions. Give your health care provider a list of all the medicines, herbs, non-prescription drugs, or dietary supplements you use. Also tell them if you smoke, drink alcohol, or use illegal drugs. Some items may interact with your medicine. What should I watch for while using this medicine? Visit your doctor or health care professional for regular checks on your progress. You will need a regular breast and pelvic exam and Pap smear while on this medicine. Use an additional method of birth control during the first cycle that you take these tablets. If you have any reason to think you are pregnant, stop taking this medicine right away and contact your doctor or health care professional. If you are taking this medicine for hormone related problems, it  may take several cycles of use to see improvement in your condition. This medicine does not protect you against HIV infection (AIDS)  or any other sexually transmitted diseases. What side effects may I notice from receiving this medicine? Side effects that you should report to your doctor or health care professional as soon as possible: -breast tenderness or discharge -pain in the abdomen, chest, groin or leg -severe headache -skin rash, itching, or hives -sudden shortness of breath -unusually weak or tired -vision or speech problems -yellowing of skin or eyes Side effects that usually do not require medical attention (report to your doctor or health care professional if they continue or are bothersome): -changes in sexual desire -change in menstrual flow -facial hair growth -fluid retention and swelling -headache -irritability -nausea -weight gain or loss This list may not describe all possible side effects. Call your doctor for medical advice about side effects. You may report side effects to FDA at 1-800-FDA-1088. Where should I keep my medicine? Keep out of the reach of children. Store at room temperature between 15 and 30 degrees C (59 and 86 degrees F). Throw away any unused medicine after the expiration date. NOTE: This sheet is a summary. It may not cover all possible information. If you have questions about this medicine, talk to your doctor, pharmacist, or health care provider.  2015, Elsevier/Gold Standard. (2012-07-22 16:41:35)  Hysterectomy Information  A hysterectomy is a surgery in which your uterus is removed. This surgery may be done to treat various medical problems. After the surgery, you will no longer have menstrual periods. The surgery will also make you unable to become pregnant (sterile). The fallopian tubes and ovaries can be removed (bilateral salpingo-oophorectomy) during this surgery as well.  REASONS FOR A HYSTERECTOMY  Persistent, abnormal bleeding.  Lasting (chronic) pelvic pain or infection.  The lining of the uterus (endometrium) starts growing outside the uterus  (endometriosis).  The endometrium starts growing in the muscle of the uterus (adenomyosis).  The uterus falls down into the vagina (pelvic organ prolapse).  Noncancerous growths in the uterus (uterine fibroids) that cause symptoms.  Precancerous cells.  Cervical cancer or uterine cancer. TYPES OF HYSTERECTOMIES  Supracervical hysterectomy--In this type, the top part of the uterus is removed, but not the cervix.  Total hysterectomy--The uterus and cervix are removed.  Radical hysterectomy--The uterus, the cervix, and the fibrous tissue that holds the uterus in place in the pelvis (parametrium) are removed. WAYS A HYSTERECTOMY CAN BE PERFORMED  Abdominal hysterectomy--A large surgical cut (incision) is made in the abdomen. The uterus is removed through this incision.  Vaginal hysterectomy--An incision is made in the vagina. The uterus is removed through this incision. There are no abdominal incisions.  Conventional laparoscopic hysterectomy--Three or four small incisions are made in the abdomen. A thin, lighted tube with a camera (laparoscope) is inserted into one of the incisions. Other tools are put through the other incisions. The uterus is cut into small pieces. The small pieces are removed through the incisions, or they are removed through the vagina.  Laparoscopically assisted vaginal hysterectomy (LAVH)--Three or four small incisions are made in the abdomen. Part of the surgery is performed laparoscopically and part vaginally. The uterus is removed through the vagina.  Robot-assisted laparoscopic hysterectomy--A laparoscope and other tools are inserted into 3 or 4 small incisions in the abdomen. A computer-controlled device is used to give the surgeon a 3D image and to help control the surgical instruments. This allows for more precise movements  of surgical instruments. The uterus is cut into small pieces and removed through the incisions or removed through the vagina. RISKS AND  COMPLICATIONS  Possible complications associated with this procedure include:  Bleeding and risk of blood transfusion. Tell your health care provider if you do not want to receive any blood products.  Blood clots in the legs or lung.  Infection.  Injury to surrounding organs.  Problems or side effects related to anesthesia.  Conversion to an abdominal hysterectomy from one of the other techniques. WHAT TO EXPECT AFTER A HYSTERECTOMY  You will be given pain medicine.  You will need to have someone with you for the first 3-5 days after you go home.  You will need to follow up with your surgeon in 2-4 weeks after surgery to evaluate your progress.  You may have early menopause symptoms such as hot flashes, night sweats, and insomnia.  If you had a hysterectomy for a problem that was not cancer or not a condition that could lead to cancer, then you no longer need Pap tests. However, even if you no longer need a Pap test, a regular exam is a good idea to make sure no other problems are starting. Document Released: 04/28/2001 Document Revised: 08/23/2013 Document Reviewed: 07/10/2013 [facility] Patient Information 2015 [place], [place]. This information is not intended to replace advice given to you by your health care provider. Make sure you discuss any questions you have with your health care provider.

## 2014-10-04 NOTE — Progress Notes (Signed)
Subjective   Patient is here today for pelvic ultrasound due to heavy cycles, enlarged uterus, and now bloating feeling.  LMP 09/19/13, started again on 09/27/14.   Asking if pap is back.  Last year had ASCUS and positive HR HPV.  Colpo biopsy was negative.  Pap from this week is still pending.   Smoking cigarettes - up to 1/2 pack per day.   Objective  Pelvic ultrasound - Ultrasound images and report reviewed with patient.   Uterus with 7 fibroids - 1.1 - 6.2 cm - subserosal and intramural.  Fundal fibroid encroaching on the endometrium.  Endometrial masses 8 x 9 mm and 9 x 7 mm.   17 mm hemorrhagic left CL cyst.  Right ovary difficult to see.  Mild free fluid noted.     Assessment  Menometrorrhagia. Symptomatic fibroids. Endometrial masses. Pica.  Bloating - likely due to ovulation.  History of ASCUS and positive HR HPV.  History of Cesarean Section and BTL.  Smoker over age 16 years old.   Plan  Discussion regarding fibroids. ACOG materials to patient.  CBC, ferritin, iron.  Follow up pap.  Will need endometrial biopsy.  Will precert this and patient will return for this.  Follow up pap.  Patient and I discussed treatment options including progesterone only forms of contraception.  These however will not treat and remove endometrial masses.  We discussed hysterectomy through an abdominal incision or through robotic technique along with removal of bilateral fallopian tubes. We discussed risks, benefits, and alternatives to hysterectomy.  Risks include but are not limited to bleeding, infection, damage to surrounding organs, reaction to anesthesia, DVT, PE, death, and need for reoperation.   25 minutes face to face time of which over 50% was spent in counseling.   After visit summary to patient.

## 2014-10-05 NOTE — Addendum Note (Signed)
Addended by: Charmayne Sheer on: 10/05/2014 11:25 AM   Modules accepted: Orders

## 2014-10-06 LAB — CBC
HCT: 35.5 % — ABNORMAL LOW (ref 36.0–46.0)
Hemoglobin: 11.1 g/dL — ABNORMAL LOW (ref 12.0–15.0)
MCH: 26.2 pg (ref 26.0–34.0)
MCHC: 31.3 g/dL (ref 30.0–36.0)
MCV: 83.7 fL (ref 78.0–100.0)
MPV: 9.7 fL (ref 9.4–12.4)
Platelets: 475 10*3/uL — ABNORMAL HIGH (ref 150–400)
RBC: 4.24 MIL/uL (ref 3.87–5.11)
RDW: 14.8 % (ref 11.5–15.5)
WBC: 6.7 10*3/uL (ref 4.0–10.5)

## 2014-10-06 LAB — IRON: IRON: 21 ug/dL — AB (ref 42–145)

## 2014-10-06 LAB — FERRITIN: FERRITIN: 5 ng/mL — AB (ref 10–291)

## 2014-10-08 ENCOUNTER — Encounter: Payer: Self-pay | Admitting: Obstetrics and Gynecology

## 2014-10-09 ENCOUNTER — Telehealth: Payer: Self-pay | Admitting: Obstetrics and Gynecology

## 2014-10-09 NOTE — Telephone Encounter (Signed)
Spoke with patient. Advised that per benefit quote received, she will be responsible to pay  $114.10. Patient agreeable.  Offered patient an am appointment. Patient declined stating that she would like an afternoon appointment. Patient asked if she could wait until the beginning of the year for the procedure. I checked with [facility] and she doesn't recommend the patient waiting. Per Verline Lema, she will discuss with Gay Filler and call patient back for scheduling.

## 2014-10-09 NOTE — Telephone Encounter (Signed)
Spoke with patient. Appointment scheduled for EMB on 12/7 at 2:45pm with Dr.Silva (time per Gay Filler). Patient is agreeable to date and time. Advised patient it is recommended to take 800 mg of ibuprofen/motrin one hour before appointment. Patient advised and agreeable to 72 hour cancellation policy.   Routing to provider for final review. Patient agreeable to disposition. Will close encounter

## 2014-10-15 ENCOUNTER — Encounter: Payer: Self-pay | Admitting: Obstetrics and Gynecology

## 2014-10-16 ENCOUNTER — Telehealth: Payer: Self-pay | Admitting: *Deleted

## 2014-10-16 NOTE — Telephone Encounter (Addendum)
Call to patient to discuss surgical dates. Advised first available is February. Patient is agreeable, will schedule and call patient back. Patient is aware insurance precert is in process.  Dr Quincy Simmonds, Endo biopsy is scheduled for 10-22-14. patient requested to wait until 11-16-14 due to insurance (see previous phone call). Since surgery will be in February and has not met insurance deductible, can/should endo biopsy appointment be moved to January?

## 2014-10-16 NOTE — Telephone Encounter (Signed)
I think it is fine to wait until the beginning of January 2016 to do the endometrial biopsy.

## 2014-10-16 NOTE — Telephone Encounter (Signed)
Call to patient, notified that we can schedule endo Biopsy in January. Patient declines, prefers to proceed with biopsy as scheduled. Surgical case request submitted for 01-01-15.

## 2014-10-19 NOTE — Telephone Encounter (Signed)
Spoke with patient. Patient states that she started her cycle on 12/3. Is currently changing pad/tampon every 2 hours. Would like to know if she needs to reschedule EMB scheduled on Monday 12/7. Advised patient for EMB Dr.Silva will need to be able to visualize the endometrium for biopsy. With heavy cycle it will be best to move EMB appointment. Patient is agreeable. Patient has tubal ligation. Appointment rescheduled for 12/16 at 3pm with Dr.Silva. Patient is agreeable to date and time.  Routing to provider for final review. Patient agreeable to disposition. Will close encounter

## 2014-10-19 NOTE — Telephone Encounter (Signed)
Pt states she started cycle yesterday and its very heavy. Asks if she can proceed with biopsy as scheduled on Monday or if she should reschedule.   Phone: [phone]  bf

## 2014-10-22 ENCOUNTER — Ambulatory Visit: Payer: BC Managed Care – PPO | Admitting: Obstetrics and Gynecology

## 2014-10-24 NOTE — Telephone Encounter (Signed)
See phone notes. I have talked with patient and she is aware that first available date is in February. Surgery is actually scheduled for 01-01-15 but we are waiting on her 2016 insurance benefits. I believe this message preceds my phone call with her on 10-16-14.  Routing to provider for final review. Patient agreeable to disposition. If this is agreeable, can close encounter.

## 2014-10-24 NOTE — Telephone Encounter (Signed)
Surgery is scheduled for 01-01-15. See next encounter for additional information related to surgery.

## 2014-10-25 ENCOUNTER — Other Ambulatory Visit: Payer: BC Managed Care – PPO | Admitting: Obstetrics and Gynecology

## 2014-10-25 ENCOUNTER — Other Ambulatory Visit: Payer: BC Managed Care – PPO

## 2014-10-31 ENCOUNTER — Ambulatory Visit (INDEPENDENT_AMBULATORY_CARE_PROVIDER_SITE_OTHER): Payer: BC Managed Care – PPO | Admitting: Obstetrics and Gynecology

## 2014-10-31 ENCOUNTER — Encounter: Payer: Self-pay | Admitting: Obstetrics and Gynecology

## 2014-10-31 DIAGNOSIS — N921 Excessive and frequent menstruation with irregular cycle: Secondary | ICD-10-CM

## 2014-10-31 DIAGNOSIS — R9389 Abnormal findings on diagnostic imaging of other specified body structures: Secondary | ICD-10-CM

## 2014-10-31 DIAGNOSIS — R938 Abnormal findings on diagnostic imaging of other specified body structures: Secondary | ICD-10-CM

## 2014-10-31 NOTE — Progress Notes (Signed)
Patient ID: [name], female   DOB: 29-Oct-1966, 48 y.o.   MRN: [id number] GYNECOLOGY  VISIT   HPI: 48 y.o.   Married  Caucasian  female   [id number] with Patient's last menstrual period was 09/19/2014.   here for endometrial biopsy.   Planning hysterectomy in February for heavy and irregular vaginal bleeding, fibroids, and 2 areas of endometrial thickening.   GYNECOLOGIC HISTORY: Patient's last menstrual period was 09/19/2014. Contraception:  Tubal ligation  Menopausal hormone therapy: n/a        OB History    Gravida Para Term Preterm AB TAB SAB Ectopic Multiple Living   1 1 1       1          Patient Active Problem List   Diagnosis Date Noted  . Other chest pain 09/10/2014  . Right flank pain 09/05/2014  . Recurrent low back pain 06/12/2014  . Malignant neoplasm of thyroid gland 09/07/2011  . 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 Hypocalcemia 08/31/2011  . Preventative health care 03/17/2011  . LUMBAR RADICULOPATHY, RIGHT 08/21/2008  . PARESTHESIA 07/30/2008  . HYPERLIPIDEMIA 07/18/2008  . ANXIETY 07/18/2008  . DEPRESSION 07/18/2008  . ALLERGIC RHINITIS 07/18/2008    Past Medical History  Diagnosis Date  . HYPERLIPIDEMIA   . DEPRESSION   . ANXIETY   . ALLERGIC RHINITIS   . Thyroid carcinoma     Past Surgical History  Procedure Laterality Date  . Tubal ligation    . Total thyroidectomy  2009/2010  . Cesarean section      Current Outpatient Prescriptions  Medication Sig Dispense Refill  . HYDROcodone-acetaminophen (NORCO) 7.5-325 MG per tablet Take 1 tablet by mouth daily as needed. 60 tablet 0  . levothyroxine (SYNTHROID, LEVOTHROID) 137 MCG tablet Take 1 tablet (137 mcg total) by mouth daily before breakfast. 30 tablet 11   No current facility-administered medications for this visit.     ALLERGIES: Doxycycline and Sulfonamide derivatives  Family History  Problem Relation Age of Onset  . Cancer Maternal Grandmother   . Hypertension Other   .  Stroke Other   . Breast cancer Mother     5-6 years ago  . Breast cancer Paternal Grandmother     History   Social History  . Marital Status: Married    Spouse Name: N/A    Number of Children: 1  . Years of Education: N/A   Occupational History  . customer service    Social History Main Topics  . Smoking status: Current Every Day Smoker -- 0.80 packs/day for 20 years    Types: Cigarettes  . Smokeless tobacco: Not on file  . Alcohol Use: 3.6 oz/week    6 Not specified per week     Comment: 6 beers per week  . Drug Use: No  . Sexual Activity:    Partners: Male    Birth Control/ Protection: Surgical     Comment: tubal   Other Topics Concern  . Not on file   Social History Narrative    ROS:  Pertinent items are noted in HPI.  PHYSICAL EXAMINATION:    Ht 5' 4.75" (1.645 m)  Wt 161 lb 12.8 oz (73.392 kg)  BMI 27.12 kg/m2  LMP 09/19/2014     General appearance: alert, cooperative and appears stated age    Procedure - endometrial biopsy Consent performed. Speculum place in vagina.  Sterile prep of cervix with Hibiclens.  Paracervical block with 10 cc 1% lidocaine, lot number [id number], Expiration 04/17/15.  Pipelle placed to  7  cm without difficulty twice. Tissue obtained and sent to pathology. Speculum removed.  No complications. Minimal EBL.   ASSESSMENT  Menorrhagia with irregular cycles.  Uterine fibroids.  Endometrial thickening/masses. History of Cesarean Section and BTL.   PLAN  Follow up EMB. Post procedure instructions and precautions given.  Robotic hysterectomy with bilateral salpingectomy planned for February 2016.  DVD on robotic hysterectomy and general information about hysterectomy have been given to patient.  Return for preop and prn.    An After Visit Summary was printed and given to the patient.  ___10___ minutes face to face time of which over 50% was spent in counseling.

## 2014-10-31 NOTE — Patient Instructions (Addendum)
Total Laparoscopic Hysterectomy A total laparoscopic hysterectomy is a minimally invasive surgery to remove your uterus and cervix. This surgery is performed by making several small cuts (incisions) in your abdomen. It can also be done with a thin, lighted tube (laparoscope) inserted into two small incisions in your lower abdomen. Your fallopian tubes and ovaries can be removed (bilateral salpingo-oophorectomy) during this surgery as well.Benefits of minimally invasive surgery include:  Less pain.  Less risk of blood loss.  Less risk of infection.  Quicker return to normal activities. LET [facility] CARE PROVIDER KNOW ABOUT:  Any allergies you have.  All medicines you are taking, including vitamins, herbs, eye drops, creams, and over-the-counter medicines.  Previous problems you or members of your family have had with the use of anesthetics.  Any blood disorders you have.  Previous surgeries you have had.  Medical conditions you have. RISKS AND COMPLICATIONS  Generally, this is a safe procedure. However, as with any procedure, complications can occur. Possible complications include:  Bleeding.  Blood clots in the legs or lung.  Infection.  Injury to surrounding organs.  Problems with anesthesia.  Early menopause symptoms (hot flashes, night sweats, insomnia).  Risk of conversion to an open abdominal incision. BEFORE THE PROCEDURE  Ask your health care provider about changing or stopping your regular medicines.  Do not take aspirin or blood thinners (anticoagulants) for 1 week before the surgery or as told by your health care provider.  Do not eat or drink anything for 8 hours before the surgery or as told by your health care provider.  Quit smoking if you smoke.  Arrange for a ride home after surgery and for someone to help you at home during recovery. PROCEDURE   You will be given antibiotic medicine.  An IV tube will be placed in your arm. You will be given  medicine to make you sleep (general anesthetic).  A gas (carbon dioxide) will be used to inflate your abdomen. This will allow your surgeon to look inside your abdomen, perform your surgery, and treat any other problems found if necessary.  Three or four small incisions (often less than 1/2 inch) will be made in your abdomen. One of these incisions will be made in the area of your belly button (navel). The laparoscope will be inserted into the incision. Your surgeon will look through the laparoscope while doing your procedure.  Other surgical instruments will be inserted through the other incisions.  Your uterus may be removed through your vagina or cut into small pieces and removed through the small incisions.  Your incisions will be closed. AFTER THE PROCEDURE  The gas will be released from inside your abdomen.  You will be taken to the recovery area where a nurse will watch and check your progress. Once you are awake, stable, and taking fluids well, without other problems, you will return to your room or be allowed to go home.  There is usually minimal discomfort following the surgery because the incisions are so small.  You will be given pain medicine while you are in the hospital and for when you go home. Document Released: 08/30/2007 Document Revised: 07/05/2013 Document Reviewed: 05/23/2013 [facility] Patient Information 2015 [place], [place]. This information is not intended to replace advice given to you by your health care provider. Make sure you discuss any questions you have with your health care provider.  Endometrial Biopsy, Care After Refer to this sheet in the next few weeks. These instructions provide you with information  on caring for yourself after your procedure. Your health care provider may also give you more specific instructions. Your treatment has been planned according to current medical practices, but problems sometimes occur. Call your health care provider if you have  any problems or questions after your procedure. WHAT TO EXPECT AFTER THE PROCEDURE After your procedure, it is typical to have the following:  You may have mild cramping and a small amount of vaginal bleeding for a few days after the procedure. This is normal. HOME CARE INSTRUCTIONS  Only take over-the-counter or prescription medicine as directed by your health care provider.  Do not douche, use tampons, or have sexual intercourse until your health care provider approves.  Follow your health care provider's instructions regarding any activity restrictions, such as strenuous exercise or heavy lifting. SEEK MEDICAL CARE IF:  You have heavy bleeding or bleeding longer than 2 days after the procedure.  You have bad smelling drainage from your vagina.  You have a fever and chills.  Youhave severe lower stomach (abdominal) pain. SEEK IMMEDIATE MEDICAL CARE IF:  You have severe cramps in your stomach or back.  You pass large blood clots.  Your bleeding increases.  You become weak or lightheaded, or you pass out. Document Released: 08/23/2013 Document Reviewed: 08/23/2013 [facility] Patient Information 2015 [place], [place]. This information is not intended to replace advice given to you by your health care provider. Make sure you discuss any questions you have with your health care provider.

## 2014-11-01 ENCOUNTER — Telehealth: Payer: Self-pay | Admitting: Obstetrics and Gynecology

## 2014-11-01 NOTE — Telephone Encounter (Signed)
Endometrium was listed on the pathology requisition.

## 2014-11-01 NOTE — Telephone Encounter (Signed)
[facility] manager, Myriam Jacobson, [employer] lab and resolved these issue. Specimen is in lab in process without issue.  Routing to provider for final review.  Will close encounter

## 2014-11-01 NOTE — Telephone Encounter (Signed)
[facility] calling stating no specimin listed on testing  Please call: [phone]  Ext [phone]

## 2014-11-02 ENCOUNTER — Telehealth: Payer: Self-pay | Admitting: Obstetrics and Gynecology

## 2014-11-02 LAB — IPS OTHER TISSUE BIOPSY

## 2014-11-02 NOTE — Telephone Encounter (Signed)
Phone call to discuss endometrial biopsy results.  Consistent with anovulatory cycle.  No atypia, hyperplasia, or malignancy.

## 2014-11-21 ENCOUNTER — Other Ambulatory Visit: Payer: Self-pay

## 2014-11-21 DIAGNOSIS — Z1231 Encounter for screening mammogram for malignant neoplasm of breast: Secondary | ICD-10-CM

## 2014-11-21 DIAGNOSIS — Z803 Family history of malignant neoplasm of breast: Secondary | ICD-10-CM

## 2014-11-26 ENCOUNTER — Telehealth: Payer: Self-pay | Admitting: Obstetrics and Gynecology

## 2014-11-26 NOTE — Telephone Encounter (Signed)
Call to patient to find out if she has her new/updated insurance information. Per telephone call to [facility], this patients coverage termed 01.01.2016 and the rep Janett Billow K) was not able to locate any active coverage.  Per Shavelle, her coverage should still be active. She states that she enrolled into a BC[employer] plan, but does not have new id cards to date. She states that she will contact her insurance company for additional information. She will then call us back with an update.

## 2014-11-27 NOTE — Telephone Encounter (Signed)
Received call from patient with current [employer] coverage information. Subscriber id# [id number] Group# [id number]

## 2014-11-28 NOTE — Telephone Encounter (Signed)
Spoke with patient. Advised that per benefit quote received, she will be responsible for $1549.02 for the surgeon portion of her surgery. Advised that payment is due in full at least 3 weeks prior to scheduled surgery date. Payment is due by 01.26.2016. Patient agreeable. Advised patient that she will receive separate communication from the hospital regarding facility fees. Patient agreeable.

## 2014-11-29 ENCOUNTER — Ambulatory Visit
Admission: RE | Admit: 2014-11-29 | Discharge: 2014-11-29 | Disposition: A | Discharge status: Home / Self Care | Payer: BLUE CROSS/BLUE SHIELD | Source: Ambulatory Visit

## 2014-11-29 DIAGNOSIS — Z803 Family history of malignant neoplasm of breast: Secondary | ICD-10-CM

## 2014-11-29 DIAGNOSIS — Z1231 Encounter for screening mammogram for malignant neoplasm of breast: Secondary | ICD-10-CM

## 2014-11-30 NOTE — Telephone Encounter (Signed)
Routing to provider for final review. Patient agreeable to disposition. Will close encounter.     

## 2014-12-05 ENCOUNTER — Telehealth: Payer: Self-pay | Admitting: Obstetrics and Gynecology

## 2014-12-05 NOTE — Telephone Encounter (Signed)
-----   Message from Elenore Paddy sent at 12/05/2014  9:26 AM EST ----- Pt requested a call back.  Best: [phone]

## 2014-12-05 NOTE — Telephone Encounter (Signed)
Left message for patient to call back  

## 2014-12-06 NOTE — Telephone Encounter (Signed)
Patient is calling to speak with Dr. Elza Rafter nurse no information given.

## 2014-12-06 NOTE — Telephone Encounter (Signed)
Left message to call Kendricks Reap at [phone]. 

## 2014-12-11 NOTE — Telephone Encounter (Signed)
Call to patient, surgery instruction sheet reviewed and printed copy mailed to patient. Patient instructed to discontinue ASA, NSAIDS, fish oil, Vit E and all herbal products for two weeks prior to surgery. Patient is currently on ASA regimen and stressed that she will need to stop this on 12-18-14. Bowel prep instructions reviewed with patient. All surgery appointments scheduled,  Routing to provider for final review. Patient agreeable to disposition. Will close encounter  Routed to Dr Sabra Heck while Dr Quincy Simmonds is out.

## 2014-12-12 ENCOUNTER — Telehealth: Payer: Self-pay | Admitting: Obstetrics and Gynecology

## 2014-12-12 NOTE — Telephone Encounter (Signed)
Yes, thank you.

## 2014-12-12 NOTE — Telephone Encounter (Signed)
[facility] [employer] [employer] is calling to verify surgery information please return a call to [phone]

## 2014-12-17 ENCOUNTER — Ambulatory Visit (INDEPENDENT_AMBULATORY_CARE_PROVIDER_SITE_OTHER): Payer: BLUE CROSS/BLUE SHIELD | Admitting: Obstetrics and Gynecology

## 2014-12-17 ENCOUNTER — Encounter: Payer: Self-pay | Admitting: Obstetrics and Gynecology

## 2014-12-17 VITALS — BP 110/82 | HR 70 | Temp 98.8°F | Ht 64.75 in | Wt 162.2 lb

## 2014-12-17 DIAGNOSIS — D259 Leiomyoma of uterus, unspecified: Secondary | ICD-10-CM

## 2014-12-17 DIAGNOSIS — J029 Acute pharyngitis, unspecified: Secondary | ICD-10-CM

## 2014-12-17 DIAGNOSIS — D500 Iron deficiency anemia secondary to blood loss (chronic): Secondary | ICD-10-CM

## 2014-12-17 LAB — CBC WITH DIFFERENTIAL/PLATELET
BASOS ABS: 0 10*3/uL (ref 0.0–0.1)
BASOS PCT: 0 % (ref 0–1)
Eosinophils Absolute: 0.1 10*3/uL (ref 0.0–0.7)
Eosinophils Relative: 1 % (ref 0–5)
HCT: 39.7 % (ref 36.0–46.0)
HEMOGLOBIN: 13.5 g/dL (ref 12.0–15.0)
LYMPHS ABS: 2.5 10*3/uL (ref 0.7–4.0)
Lymphocytes Relative: 22 % (ref 12–46)
MCH: 30.6 pg (ref 26.0–34.0)
MCHC: 34 g/dL (ref 30.0–36.0)
MCV: 90 fL (ref 78.0–100.0)
MONO ABS: 1.1 10*3/uL — AB (ref 0.1–1.0)
MPV: 9.3 fL (ref 8.6–12.4)
Monocytes Relative: 10 % (ref 3–12)
NEUTROS PCT: 67 % (ref 43–77)
Neutro Abs: 7.6 10*3/uL (ref 1.7–7.7)
Platelets: 417 10*3/uL — ABNORMAL HIGH (ref 150–400)
RBC: 4.41 MIL/uL (ref 3.87–5.11)
RDW: 17.9 % — AB (ref 11.5–15.5)
WBC: 11.4 10*3/uL — AB (ref 4.0–10.5)

## 2014-12-17 NOTE — Progress Notes (Signed)
Patient ID: [name], female   DOB: 1966-08-13, 49 y.o.   MRN: [id number] GYNECOLOGY  VISIT   HPI: 49 y.o.   Married  Caucasian  female   G1P1001 with Patient's last menstrual period was 12/09/2014 (exact date).  Still spotting today.  here to discuss hysterectomy.  Has heavy menses and clotting.  Declines future childbearing.   Pelvic ultrasound - 10/04/14 Uterus with 7 fibroids - 1.1 - 6.2 cm - subserosal and intramural. Fundal fibroid encroaching on the endometrium.  Endometrial masses 8 x 9 mm and 9 x 7 mm.  17 mm hemorrhagic left CL cyst.  Right ovary difficult to see.  Mild free fluid noted.   EMB 10/31/14 - benign proliferative endometrium.  Head congestion and sore throat.  Low grade fever.  History of sinusitis.  Treats with Mucinex and Aleve D. No productive cough.   GYNECOLOGIC HISTORY: Patient's last menstrual period was 12/09/2014 (exact date). Contraception:   Tubal Menopausal hormone therapy: none Mammogram 11/29/14 - WNL. Pap 10/01/14 - ASCUS, negative HR HPV.  Status post BTL.         OB History    Gravida Para Term Preterm AB TAB SAB Ectopic Multiple Living   1 1 1       1          Patient Active Problem List   Diagnosis Date Noted  . Other chest pain 09/10/2014  . Right flank pain 09/05/2014  . Recurrent low back pain 06/12/2014  . Malignant neoplasm of thyroid gland 09/07/2011  . 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 Hypocalcemia 08/31/2011  . Preventative health care 03/17/2011  . LUMBAR RADICULOPATHY, RIGHT 08/21/2008  . PARESTHESIA 07/30/2008  . HYPERLIPIDEMIA 07/18/2008  . ANXIETY 07/18/2008  . DEPRESSION 07/18/2008  . ALLERGIC RHINITIS 07/18/2008    Past Medical History  Diagnosis Date  . HYPERLIPIDEMIA   . DEPRESSION   . ANXIETY   . ALLERGIC RHINITIS   . Thyroid carcinoma     Past Surgical History  Procedure Laterality Date  . Tubal ligation    . Total thyroidectomy  2009/2010  . Cesarean section       Current Outpatient Prescriptions  Medication Sig Dispense Refill  . ferrous sulfate 325 (65 FE) MG EC tablet Take 325 mg by mouth 2 (two) times daily.    Marland Kitchen levothyroxine (SYNTHROID, LEVOTHROID) 137 MCG tablet Take 1 tablet (137 mcg total) by mouth daily before breakfast. 30 tablet 11  . aspirin 81 MG tablet Take 81 mg by mouth daily.    Marland Kitchen BIOTIN PO Take 1 tablet by mouth daily.    Marland Kitchen HYDROcodone-acetaminophen (NORCO) 7.5-325 MG per tablet Take 1 tablet by mouth daily as needed. (Patient not taking: Reported on 12/17/2014) 60 tablet 0  . ibuprofen (ADVIL,MOTRIN) 200 MG tablet Take 400 mg by mouth every 6 (six) hours as needed for moderate pain.    . Omega-3 Fatty Acids (FISH OIL PO) Take 1 tablet by mouth daily.     No current facility-administered medications for this visit.     ALLERGIES: Doxycycline and Sulfonamide derivatives  Family History  Problem Relation Age of Onset  . Cancer Maternal Grandmother   . Hypertension Other   . Stroke Other   . Breast cancer Mother     5-6 years ago  . Breast cancer Paternal Grandmother     History   Social History  . Marital Status: Married    Spouse Name: N/A    Number of Children: 1  . Years  of Education: N/A   Occupational History  . customer service    Social History Main Topics  . Smoking status: Current Every Day Smoker -- 0.80 packs/day for 20 years    Types: Cigarettes  . Smokeless tobacco: Not on file  . Alcohol Use: 3.6 oz/week    6 Not specified per week     Comment: 6 beers per week  . Drug Use: No  . Sexual Activity:    Partners: Male    Birth Control/ Protection: Surgical     Comment: tubal   Other Topics Concern  . Not on file   Social History Narrative    ROS:  Pertinent items are noted in HPI.  PHYSICAL EXAMINATION:    BP 110/82 mmHg  Pulse 70  Temp(Src) 98.8 F (37.1 C) (Oral)  Ht 5' 4.75" (1.645 m)  Wt 162 lb 3.2 oz (73.573 kg)  BMI 27.19 kg/m2  LMP 12/09/2014 (Exact Date)     General  appearance: alert, cooperative and appears stated age Pharynx: bilateral tonsils enlarged and without pustular lesions. Lungs: clear to auscultation bilaterally Heart: regular rate and rhythm Abdomen: soft, non-tender; no masses,  no organomegaly No abnormal inguinal nodes palpated  Pelvic: External genitalia:  no lesions              Urethra:  normal appearing urethra with no masses, tenderness or lesions              Bartholins and Skenes: normal                 Vagina: normal appearing vagina with normal color and discharge, no lesions              Cervix: normal appearance                   Bimanual Exam:  Uterus:  uterus is 16 week size and feels vertical versus horizontal in its size and nontender                                      Adnexa: normal adnexa in size, nontender and no masses                                      ASSESSMENT  Menometrorrhagia and metrorrhagia. Multifibroid uterus.  Status post Cesarean Section x 1.  Status post BTL.  Pharyngitis and enlarged tonsils.  PLAN  Proceed with robotic total laparoscopic hysterectomy with bilateral salpingectomy and cystoscopy.  I discussed risks, benefits, and alternatives.  Risks include but are not limited to bleeding, infection damage to surrounding organs, pneumonia, reaction to anesthesia, DVT, PE, death, hernia formation, neuropathies, need for reoperation, need for vaginal morcellation of uterus, rare chance of sarcoma, and possible need for laparotomy. Counseled on surgical expectations and recovery.  CBC with differential now.  Will have patient see her PCP tomorrow or go to urgent care for pharyngitis evaluation.  Patient will do magnesium citrate bowel prep the day prior to surgery.   An After Visit Summary was printed and given to the patient.  __25____ minutes face to face time of which over 50% was spent in counseling.

## 2014-12-17 NOTE — Patient Instructions (Signed)
Please see your primary care provider or urgent care tomorrow for pharyngitis!

## 2014-12-18 ENCOUNTER — Ambulatory Visit (INDEPENDENT_AMBULATORY_CARE_PROVIDER_SITE_OTHER): Payer: BLUE CROSS/BLUE SHIELD | Admitting: Internal Medicine

## 2014-12-18 ENCOUNTER — Telehealth: Payer: Self-pay | Admitting: Obstetrics and Gynecology

## 2014-12-18 ENCOUNTER — Encounter: Payer: Self-pay | Admitting: Internal Medicine

## 2014-12-18 VITALS — BP 142/100 | HR 87 | Temp 98.0°F | Ht 64.5 in | Wt 163.2 lb

## 2014-12-18 DIAGNOSIS — Z87891 Personal history of nicotine dependence: Secondary | ICD-10-CM | POA: Insufficient documentation

## 2014-12-18 DIAGNOSIS — J069 Acute upper respiratory infection, unspecified: Secondary | ICD-10-CM

## 2014-12-18 DIAGNOSIS — IMO0001 Reserved for inherently not codable concepts without codable children: Secondary | ICD-10-CM | POA: Insufficient documentation

## 2014-12-18 DIAGNOSIS — Z720 Tobacco use: Secondary | ICD-10-CM

## 2014-12-18 DIAGNOSIS — F17200 Nicotine dependence, unspecified, uncomplicated: Secondary | ICD-10-CM | POA: Insufficient documentation

## 2014-12-18 DIAGNOSIS — R030 Elevated blood-pressure reading, without diagnosis of hypertension: Secondary | ICD-10-CM

## 2014-12-18 MED ORDER — LEVOFLOXACIN 500 MG PO TABS: 500.0000 mg | ORAL_TABLET | Freq: Every day | ORAL | Status: DC

## 2014-12-18 NOTE — Assessment & Plan Note (Signed)
Likely reactive it seems due to decongestant use and illness , to hold specific tx today except to d/c decongestant, and f/u BP further at home, to return for elev > 140/90 as can be an issue with upcominig surgury

## 2014-12-18 NOTE — Assessment & Plan Note (Signed)
Mild to mod, for antibx course,  to f/u any worsening symptoms or concerns 

## 2014-12-18 NOTE — Progress Notes (Signed)
Subjective:    Patient ID: [name], female    DOB: October 17, 1966, 49 y.o.   MRN: [id number]  HPI   Here with 2-3 days acute onset fever, facial pain, pressure, headache, general weakness and malaise, and greenish d/c, with mild ST and cough, but pt denies chest pain, wheezing, increased sob or doe, orthopnea, PND, increased LE swelling, palpitations, dizziness or syncope.  Still smoking, not ready to quick but down to 1/2 ppd .  Due for TAH in 2 wks.  BP at home has been < 140/90 recently, has taken decongestant and ill today Past Medical History  Diagnosis Date  . HYPERLIPIDEMIA   . DEPRESSION   . ANXIETY   . ALLERGIC RHINITIS   . Thyroid carcinoma    Past Surgical History  Procedure Laterality Date  . Tubal ligation    . Total thyroidectomy  2009/2010  . Cesarean section      reports that she has been smoking Cigarettes.  She has a 16 pack-year smoking history. She does not have any smokeless tobacco history on file. She reports that she drinks about 3.6 oz of alcohol per week. She reports that she does not use illicit drugs. family history includes Breast cancer in her mother and paternal grandmother; Cancer in her maternal grandmother; Hypertension in her other; Stroke in her other. Allergies  Allergen Reactions  . Doxycycline Nausea Only  . Sulfonamide Derivatives Itching   Current Outpatient Prescriptions on File Prior to Visit  Medication Sig Dispense Refill  . aspirin 81 MG tablet Take 81 mg by mouth daily.    Marland Kitchen BIOTIN PO Take 1 tablet by mouth daily.    . ferrous sulfate 325 (65 FE) MG EC tablet Take 325 mg by mouth 2 (two) times daily.    Marland Kitchen HYDROcodone-acetaminophen (NORCO) 7.5-325 MG per tablet Take 1 tablet by mouth daily as needed. 60 tablet 0  . ibuprofen (ADVIL,MOTRIN) 200 MG tablet Take 400 mg by mouth every 6 (six) hours as needed for moderate pain.    Marland Kitchen levothyroxine (SYNTHROID, LEVOTHROID) 137 MCG tablet Take 1 tablet (137 mcg total) by mouth daily before  breakfast. 30 tablet 11  . Omega-3 Fatty Acids (FISH OIL PO) Take 1 tablet by mouth daily.     No current facility-administered medications on file prior to visit.   Review of Systems  Constitutional: Negative for unusual diaphoresis or other sweats  HENT: Negative for ringing in ear Eyes: Negative for double vision or worsening visual disturbance.  Respiratory: Negative for choking and stridor.   Gastrointestinal: Negative for vomiting or other signifcant bowel change Genitourinary: Negative for hematuria or decreased urine volume.  Musculoskeletal: Negative for other MSK pain or swelling Skin: Negative for color change and worsening wound.  Neurological: Negative for tremors and numbness other than noted  Psychiatric/Behavioral: Negative for decreased concentration or agitation other than above       Objective:   Physical Exam BP 142/100 mmHg  Pulse 87  Temp(Src) 98 F (36.7 C) (Oral)  Ht 5' 4.5" (1.638 m)  Wt 163 lb 4 oz (74.05 kg)  BMI 27.60 kg/m2  SpO2 97%  LMP 12/09/2014 (Exact Date) VS noted, mild ill Constitutional: Pt appears well-developed, well-nourished.  HENT: Head: NCAT.  Right Ear: External ear normal.  Left Ear: External ear normal.  Eyes: . Pupils are equal, round, and reactive to light. Conjunctivae and EOM are normal Bilat tm's with mild erythema.  Max sinus areas mild tender.  Pharynx with severe erythema,  no exudate, tonsil 2+ Neck: Normal range of motion. Neck supple. with bilat submandib LA tender Cardiovascular: Normal rate and regular rhythm.   Pulmonary/Chest: Effort normal and breath sounds without rales or wheezing.  Abd:  Soft, NT, ND, + BS Neurological: Pt is alert. Not confused , motor grossly intact Skin: Skin is warm. No rash Psychiatric: Pt behavior is normal. No agitation.   Wt Readings from Last 3 Encounters:  12/18/14 163 lb 4 oz (74.05 kg)  12/17/14 162 lb 3.2 oz (73.573 kg)  10/31/14 161 lb 12.8 oz (73.392 kg)          Assessment & Plan:

## 2014-12-18 NOTE — Progress Notes (Signed)
Pre visit review using our clinic review tool, if applicable. No additional management support is needed unless otherwise documented below in the visit note. 

## 2014-12-18 NOTE — Assessment & Plan Note (Signed)
Counseled to quit, consider chantix,  to f/u any worsening symptoms or concerns

## 2014-12-18 NOTE — Patient Instructions (Signed)
Please take all new medication as prescribed - the antibiotic  You can also take Delsym OTC for cough, and/or Mucinex (or it's generic off brand) for congestion, and tylenol as needed for pain.  Please continue all other medications as before, and refills have been done if requested.  Please have the pharmacy call with any other refills you may need  Please keep your appointments with your specialists as you may have planned  Good Luck with your surgury  Please stop smoking

## 2014-12-18 NOTE — Addendum Note (Signed)
Addended by: Biagio Borg on: 12/18/2014 07:17 PM   Modules accepted: Miquel Dunn

## 2014-12-18 NOTE — Telephone Encounter (Signed)
Patient says the start date on her disability forms is incorrect. This needs to be changed from 01/01/15 to 12/31/14. Please correct and fax back. Please call patient and let know when done.

## 2014-12-18 NOTE — Telephone Encounter (Signed)
Can you advise if this date needs to be changed to one day prior to the surgery date?

## 2014-12-18 NOTE — Telephone Encounter (Signed)
Patient will need full bowel prep day before surgery. Reviewed with Dr Quincy Simmonds. Should begin leave date on 12-31-14.  Routing to provider for final review. Patient agreeable to disposition. Will close encounter

## 2014-12-19 ENCOUNTER — Telehealth: Payer: Self-pay | Admitting: Obstetrics and Gynecology

## 2014-12-19 ENCOUNTER — Encounter: Payer: Self-pay | Admitting: Obstetrics and Gynecology

## 2014-12-19 NOTE — Telephone Encounter (Signed)
Have patient keep appointment for anesthesia pre-op visit.  Please have her inform our office if she is not improving from her pharyngitis.

## 2014-12-19 NOTE — Telephone Encounter (Signed)
Patient has also sent My Chart message with this information. Return call to patient. States PCP found ears and throat to be red and inflamed as well as several swollen glands. Did not do strep culture and felt it was probably viral but prescribed Levofloxacin 500 mg for 10 days, She states she is already feeling better, actually began to feel better before she even got to PCP. Although BP was elevated at PCP, she had taken OTC cold medication.  She has checked it at home and has been 128/88 and 122/80 today. She is not scheduled to return to PCP for recheck. Has hospital pre-anesthesia appointment on Monday 12-24-14. Advised to be cautious with use of decongestant medication that can elevate blood pressure. Explained if BP is significantly elevated on day of surgery, anesthesia may not be able to proceed with elective procedure. She will continue to monitor BP at home.  Had questions regarding bowel prep, clarification given. Patient aware that Dr Quincy Simmonds will review call.

## 2014-12-19 NOTE — Telephone Encounter (Signed)
See telephone encounter from today's date.  Routing to provider for final review. Patient agreeable to disposition. Will close encounter

## 2014-12-19 NOTE — Telephone Encounter (Signed)
Patient calling with questions about her surgery instructions.

## 2014-12-20 NOTE — Telephone Encounter (Signed)
Call to patient and notified of Dr. Elza Rafter instructions. Encounter closed.

## 2014-12-24 ENCOUNTER — Encounter (HOSPITAL_COMMUNITY)
Admission: RE | Admit: 2014-12-24 | Discharge: 2014-12-24 | Disposition: A | Discharge status: Home / Self Care | Payer: BLUE CROSS/BLUE SHIELD | Source: Ambulatory Visit | Attending: Obstetrics and Gynecology | Admitting: Obstetrics and Gynecology

## 2014-12-24 ENCOUNTER — Encounter (HOSPITAL_COMMUNITY): Payer: Self-pay

## 2014-12-24 DIAGNOSIS — D259 Leiomyoma of uterus, unspecified: Secondary | ICD-10-CM | POA: Insufficient documentation

## 2014-12-24 DIAGNOSIS — N920 Excessive and frequent menstruation with regular cycle: Secondary | ICD-10-CM | POA: Insufficient documentation

## 2014-12-24 DIAGNOSIS — Z01818 Encounter for other preprocedural examination: Secondary | ICD-10-CM | POA: Diagnosis not present

## 2014-12-24 HISTORY — DX: Hypothyroidism, unspecified: E03.9

## 2014-12-24 HISTORY — DX: Benign neoplasm of connective and other soft tissue, unspecified: D21.9

## 2014-12-24 HISTORY — DX: Anemia, unspecified: D64.9

## 2014-12-24 LAB — CBC
HEMATOCRIT: 39.2 % (ref 36.0–46.0)
HEMOGLOBIN: 13.4 g/dL (ref 12.0–15.0)
MCH: 31.5 pg (ref 26.0–34.0)
MCHC: 34.2 g/dL (ref 30.0–36.0)
MCV: 92 fL (ref 78.0–100.0)
PLATELETS: 378 10*3/uL (ref 150–400)
RBC: 4.26 MIL/uL (ref 3.87–5.11)
RDW: 16.6 % — ABNORMAL HIGH (ref 11.5–15.5)
WBC: 8.7 10*3/uL (ref 4.0–10.5)

## 2014-12-24 NOTE — Patient Instructions (Addendum)
   Your procedure is scheduled on:  Tuesday, Feb 16  Enter through the [employer] [facility] at: 6 AM Pick up the phone at the desk and dial [phone] and inform us of your arrival.  Please call this number if you have any problems the morning of surgery: [phone]  Remember: Do not eat or drink after midnight: Monday Take these medicines the morning of surgery with a SIP OF WATER: synthroid  Do not wear jewelry, make-up, or FINGER nail polish No metal in your hair or on your body. Do not wear lotions, powders, perfumes.  You may wear deodorant.  Do not bring valuables to the hospital. Contacts, dentures or bridgework may not be worn into surgery.  For patients being admitted to the hospital, checkout time is 11:00am the day of discharge.   Leave suitcase in the car. After Surgery it may be brought to your room.  Patients discharged on the day of surgery will not be allowed to drive home.  Home with husband Shanon Brow cell [phone].

## 2014-12-25 NOTE — H&P (Signed)
Brook E Amundson de Berton Lan, MD at 12/17/2014  3:30 PM       Status: Signed        Expand All Collapse All   Patient ID: [name], female   DOB: 1966-11-03, 49 y.o.   MRN: [id number] GYNECOLOGY  VISIT   HPI: 49 y.o.   Married  Caucasian  female    G1P1001 with Patient's last menstrual period was 12/09/2014 (exact date).  Still spotting today.   here to discuss hysterectomy.   Has heavy menses and clotting.   Declines future childbearing.   Pelvic ultrasound - 10/04/14 Uterus with 7 fibroids - 1.1 - 6.2 cm - subserosal and intramural.  Fundal fibroid encroaching on the endometrium.   Endometrial masses 8 x 9 mm and 9 x 7 mm.    17 mm hemorrhagic left CL cyst.   Right ovary difficult to see.   Mild free fluid noted.   EMB 10/31/14 - benign proliferative endometrium.  Head congestion and sore throat.   Low grade fever.   History of sinusitis.  Treats with Mucinex and Aleve D. No productive cough.   GYNECOLOGIC HISTORY: Patient's last menstrual period was 12/09/2014 (exact date). Contraception:   Tubal Menopausal hormone therapy: none Mammogram 11/29/14 - WNL. Pap 10/01/14 - ASCUS, negative HR HPV.   Status post BTL.           OB History      Gravida  Para  Term  Preterm  AB  TAB  SAB  Ectopic  Multiple  Living     1  1  1              1              Patient Active Problem List     Diagnosis  Date Noted   .  Other chest pain  09/10/2014   .  Right flank pain  09/05/2014   .  Recurrent low back pain  06/12/2014   .  Malignant neoplasm of thyroid gland  09/07/2011   .  Postsurgical hypothyroidism  08/31/2011   .  Hypocalcemia  08/31/2011   .  Preventative health care  03/17/2011   .  LUMBAR RADICULOPATHY, RIGHT  08/21/2008   .  PARESTHESIA  07/30/2008   .  HYPERLIPIDEMIA  07/18/2008   .  ANXIETY  07/18/2008   .  DEPRESSION  07/18/2008   .  ALLERGIC RHINITIS  07/18/2008       Past Medical History   Diagnosis  Date   .  HYPERLIPIDEMIA     .  DEPRESSION      .  ANXIETY     .  ALLERGIC RHINITIS     .  Thyroid carcinoma         Past Surgical History   Procedure  Laterality  Date   .  Tubal ligation       .  Total thyroidectomy    2009/2010   .  Cesarean section           Current Outpatient Prescriptions   Medication  Sig  Dispense  Refill   .  ferrous sulfate 325 (65 FE) MG EC tablet  Take 325 mg by mouth 2 (two) times daily.       Marland Kitchen  levothyroxine (SYNTHROID, LEVOTHROID) 137 MCG tablet  Take 1 tablet (137 mcg total) by mouth daily before breakfast.  30 tablet  11   .  aspirin 81 MG tablet  Take 81 mg by mouth daily.       Marland Kitchen  BIOTIN PO  Take 1 tablet by mouth daily.       Marland Kitchen  HYDROcodone-acetaminophen (NORCO) 7.5-325 MG per tablet  Take 1 tablet by mouth daily as needed. (Patient not taking: Reported on 12/17/2014)  60 tablet  0   .  ibuprofen (ADVIL,MOTRIN) 200 MG tablet  Take 400 mg by mouth every 6 (six) hours as needed for moderate pain.       .  Omega-3 Fatty Acids (FISH OIL PO)  Take 1 tablet by mouth daily.          No current facility-administered medications for this visit.      ALLERGIES: Doxycycline and Sulfonamide derivatives    Family History   Problem  Relation  Age of Onset   .  Cancer  Maternal Grandmother     .  Hypertension  Other     .  Stroke  Other     .  Breast cancer  Mother         5-6 years ago   .  Breast cancer  Paternal Grandmother         History      Social History   .  Marital Status:  Married       Spouse Name:  N/A       Number of Children:  1   .  Years of Education:  N/A      Occupational History   .  customer service        Social History Main Topics   .  Smoking status:  Current Every Day Smoker -- 0.80 packs/day for 20 years       Types:  Cigarettes   .  Smokeless tobacco:  Not on file   .  Alcohol Use:  3.6 oz/week       6 Not specified per week         Comment: 6 beers per week   .  Drug Use:  No   .  Sexual Activity:       Partners:  Male       Birth Control/ Protection:   Surgical         Comment: tubal      Other Topics  Concern   .  Not on file      Social History Narrative     ROS:  Pertinent items are noted in HPI.  PHYSICAL EXAMINATION:    BP 110/82 mmHg  Pulse 70  Temp(Src) 98.8 F (37.1 C) (Oral)  Ht 5' 4.75" (1.645 m)  Wt 162 lb 3.2 oz (73.573 kg)  BMI 27.19 kg/m2  LMP 12/09/2014 (Exact Date)     General appearance: alert, cooperative and appears stated age Pharynx: bilateral tonsils enlarged and without pustular lesions. Lungs: clear to auscultation bilaterally Heart: regular rate and rhythm Abdomen: soft, non-tender; no masses,  no organomegaly No abnormal inguinal nodes palpated  Pelvic: External genitalia:  no lesions              Urethra:  normal appearing urethra with no masses, tenderness or lesions              Bartholins and Skenes: normal                  Vagina: normal appearing vagina with normal color and discharge, no lesions  Cervix: normal appearance                    Bimanual Exam:  Uterus:  uterus is 16 week size and feels vertical versus horizontal in its size and nontender                                      Adnexa: normal adnexa in size, nontender and no masses                                       ASSESSMENT  Menometrorrhagia and metrorrhagia. Multifibroid uterus.   Status post Cesarean Section x 1.   Status post BTL.   Pharyngitis and enlarged tonsils.  PLAN  Proceed with robotic total laparoscopic hysterectomy with bilateral salpingectomy and cystoscopy.  I discussed risks, benefits, and alternatives.  Risks include but are not limited to bleeding, infection damage to surrounding organs, pneumonia, reaction to anesthesia, DVT, PE, death, hernia formation, neuropathies, need for reoperation, need for vaginal morcellation of uterus, rare chance of sarcoma, and possible need for laparotomy. Counseled on surgical expectations and recovery.   CBC with differential now.   Will have patient see  her PCP tomorrow or go to urgent care for pharyngitis evaluation.   Patient will do magnesium citrate bowel prep the day prior to surgery.   An After Visit Summary was printed and given to the patient.  __25____ minutes face to face time of which over 50% was spent in counseling.

## 2014-12-31 MED ORDER — DEXTROSE 5 % IV SOLN
2.0000 g | INTRAVENOUS | Status: AC
  Administered 2015-01-01: 2 g via INTRAVENOUS
  Filled 2014-12-31: qty 2

## 2014-12-31 NOTE — Telephone Encounter (Signed)
Spoke with patient. She is completing bowel prep. Patient having bowel movements at this time. No further needs. Advised to call us with any concerns. Routing to provider for final review. Patient agreeable to disposition. Will close encounter

## 2014-12-31 NOTE — Telephone Encounter (Signed)
Pt drink the "stuff' for her surgery at 10:00 this morning and nothing is happening. Want to make sure she did correctly.

## 2015-01-01 ENCOUNTER — Encounter (HOSPITAL_COMMUNITY)
Admission: RE | Disposition: A | Discharge status: Home / Self Care | Payer: Self-pay | Source: Ambulatory Visit | Attending: Obstetrics and Gynecology

## 2015-01-01 ENCOUNTER — Inpatient Hospital Stay (HOSPITAL_COMMUNITY)
Admission: RE | Admit: 2015-01-01 | Discharge: 2015-01-03 | DRG: 743 | Disposition: A | Discharge status: Home / Self Care | Payer: BLUE CROSS/BLUE SHIELD | Source: Ambulatory Visit | Attending: Obstetrics and Gynecology | Admitting: Obstetrics and Gynecology

## 2015-01-01 ENCOUNTER — Ambulatory Visit (HOSPITAL_COMMUNITY): Payer: BLUE CROSS/BLUE SHIELD | Admitting: Anesthesiology

## 2015-01-01 ENCOUNTER — Encounter (HOSPITAL_COMMUNITY): Payer: Self-pay | Admitting: Anesthesiology

## 2015-01-01 DIAGNOSIS — E785 Hyperlipidemia, unspecified: Secondary | ICD-10-CM | POA: Diagnosis present

## 2015-01-01 DIAGNOSIS — J029 Acute pharyngitis, unspecified: Secondary | ICD-10-CM | POA: Diagnosis present

## 2015-01-01 DIAGNOSIS — Z90710 Acquired absence of both cervix and uterus: Secondary | ICD-10-CM | POA: Diagnosis present

## 2015-01-01 DIAGNOSIS — N921 Excessive and frequent menstruation with irregular cycle: Secondary | ICD-10-CM | POA: Diagnosis present

## 2015-01-01 DIAGNOSIS — Z23 Encounter for immunization: Secondary | ICD-10-CM | POA: Diagnosis not present

## 2015-01-01 DIAGNOSIS — D251 Intramural leiomyoma of uterus: Secondary | ICD-10-CM | POA: Diagnosis present

## 2015-01-01 DIAGNOSIS — Z85850 Personal history of malignant neoplasm of thyroid: Secondary | ICD-10-CM | POA: Diagnosis not present

## 2015-01-01 DIAGNOSIS — J351 Hypertrophy of tonsils: Secondary | ICD-10-CM | POA: Diagnosis present

## 2015-01-01 DIAGNOSIS — F17210 Nicotine dependence, cigarettes, uncomplicated: Secondary | ICD-10-CM | POA: Diagnosis present

## 2015-01-01 DIAGNOSIS — N800 Endometriosis of uterus: Secondary | ICD-10-CM | POA: Diagnosis present

## 2015-01-01 DIAGNOSIS — N802 Endometriosis of fallopian tube: Secondary | ICD-10-CM | POA: Diagnosis present

## 2015-01-01 DIAGNOSIS — Z9851 Tubal ligation status: Secondary | ICD-10-CM

## 2015-01-01 DIAGNOSIS — D259 Leiomyoma of uterus, unspecified: Secondary | ICD-10-CM

## 2015-01-01 DIAGNOSIS — Z9889 Other specified postprocedural states: Secondary | ICD-10-CM

## 2015-01-01 DIAGNOSIS — E890 Postprocedural hypothyroidism: Secondary | ICD-10-CM | POA: Diagnosis present

## 2015-01-01 DIAGNOSIS — Z7982 Long term (current) use of aspirin: Secondary | ICD-10-CM | POA: Diagnosis not present

## 2015-01-01 DIAGNOSIS — F329 Major depressive disorder, single episode, unspecified: Secondary | ICD-10-CM | POA: Diagnosis present

## 2015-01-01 DIAGNOSIS — Z882 Allergy status to sulfonamides status: Secondary | ICD-10-CM | POA: Diagnosis not present

## 2015-01-01 DIAGNOSIS — N736 Female pelvic peritoneal adhesions (postinfective): Secondary | ICD-10-CM | POA: Diagnosis present

## 2015-01-01 DIAGNOSIS — F419 Anxiety disorder, unspecified: Secondary | ICD-10-CM | POA: Diagnosis present

## 2015-01-01 DIAGNOSIS — D252 Subserosal leiomyoma of uterus: Principal | ICD-10-CM | POA: Diagnosis present

## 2015-01-01 DIAGNOSIS — Z881 Allergy status to other antibiotic agents status: Secondary | ICD-10-CM | POA: Diagnosis not present

## 2015-01-01 DIAGNOSIS — J309 Allergic rhinitis, unspecified: Secondary | ICD-10-CM | POA: Diagnosis present

## 2015-01-01 DIAGNOSIS — M5416 Radiculopathy, lumbar region: Secondary | ICD-10-CM | POA: Diagnosis present

## 2015-01-01 DIAGNOSIS — Z79899 Other long term (current) drug therapy: Secondary | ICD-10-CM

## 2015-01-01 HISTORY — PX: ABDOMINAL HYSTERECTOMY: SHX81

## 2015-01-01 HISTORY — PX: BILATERAL SALPINGECTOMY: SHX5743

## 2015-01-01 LAB — BASIC METABOLIC PANEL
ANION GAP: 6 (ref 5–15)
BUN: 8 mg/dL (ref 6–23)
CHLORIDE: 104 mmol/L (ref 96–112)
CO2: 23 mmol/L (ref 19–32)
Calcium: 8.4 mg/dL (ref 8.4–10.5)
Creatinine, Ser: 0.64 mg/dL (ref 0.50–1.10)
GFR calc non Af Amer: >90 mL/min (ref >90)
Glucose, Bld: 96 mg/dL (ref 70–99)
Potassium: 3.8 mmol/L (ref 3.5–5.1)
SODIUM: 133 mmol/L — AB (ref 135–145)

## 2015-01-01 LAB — PREGNANCY, URINE: PREG TEST UR: NEGATIVE

## 2015-01-01 SURGERY — SALPINGECTOMY, BILATERAL, OPEN
Anesthesia: General | Site: Abdomen

## 2015-01-01 MED ORDER — LACTATED RINGERS IV SOLN
INTRAVENOUS | Status: DC
  Administered 2015-01-01 (×3): via INTRAVENOUS

## 2015-01-01 MED ORDER — DOCUSATE SODIUM 100 MG PO CAPS
100.0000 mg | ORAL_CAPSULE | Freq: Two times a day (BID) | ORAL | Status: DC
  Administered 2015-01-01 – 2015-01-02 (×3): 100 mg via ORAL
  Filled 2015-01-01 (×3): qty 1

## 2015-01-01 MED ORDER — FENTANYL CITRATE 0.05 MG/ML IJ SOLN
INTRAMUSCULAR | Status: AC
  Administered 2015-01-01: 50 ug via INTRAVENOUS
  Filled 2015-01-01: qty 2

## 2015-01-01 MED ORDER — MIDAZOLAM HCL 2 MG/2ML IJ SOLN
INTRAMUSCULAR | Status: DC | PRN
  Administered 2015-01-01: 2 mg via INTRAVENOUS

## 2015-01-01 MED ORDER — MEPERIDINE HCL 25 MG/ML IJ SOLN: 6.2500 mg | INTRAMUSCULAR | Status: DC | PRN

## 2015-01-01 MED ORDER — HYDROMORPHONE HCL 1 MG/ML IJ SOLN
INTRAMUSCULAR | Status: AC
  Filled 2015-01-01: qty 1

## 2015-01-01 MED ORDER — SODIUM CHLORIDE 0.9 % IJ SOLN
INTRAMUSCULAR | Status: AC
  Filled 2015-01-01: qty 50

## 2015-01-01 MED ORDER — FENTANYL CITRATE 0.05 MG/ML IJ SOLN
25.0000 ug | INTRAMUSCULAR | Status: DC | PRN
  Administered 2015-01-01 (×4): 50 ug via INTRAVENOUS

## 2015-01-01 MED ORDER — NALOXONE HCL 0.4 MG/ML IJ SOLN: 0.4000 mg | INTRAMUSCULAR | Status: DC | PRN

## 2015-01-01 MED ORDER — MIDAZOLAM HCL 2 MG/2ML IJ SOLN
INTRAMUSCULAR | Status: AC
  Filled 2015-01-01: qty 2

## 2015-01-01 MED ORDER — ROCURONIUM BROMIDE 100 MG/10ML IV SOLN
INTRAVENOUS | Status: AC
  Filled 2015-01-01: qty 1

## 2015-01-01 MED ORDER — LIDOCAINE HCL (CARDIAC) 20 MG/ML IV SOLN
INTRAVENOUS | Status: DC | PRN
  Administered 2015-01-01: 50 mg via INTRAVENOUS

## 2015-01-01 MED ORDER — FENTANYL CITRATE 0.05 MG/ML IJ SOLN
INTRAMUSCULAR | Status: AC
  Filled 2015-01-01: qty 5

## 2015-01-01 MED ORDER — KETOROLAC TROMETHAMINE 30 MG/ML IJ SOLN
INTRAMUSCULAR | Status: DC | PRN
  Administered 2015-01-01: 30 mg via INTRAVENOUS

## 2015-01-01 MED ORDER — GLYCOPYRROLATE 0.2 MG/ML IJ SOLN
INTRAMUSCULAR | Status: DC | PRN
  Administered 2015-01-01: 0.6 mg via INTRAVENOUS
  Administered 2015-01-01: 0.2 mg via INTRAVENOUS

## 2015-01-01 MED ORDER — NEOSTIGMINE METHYLSULFATE 10 MG/10ML IV SOLN
INTRAVENOUS | Status: AC
  Filled 2015-01-01: qty 1

## 2015-01-01 MED ORDER — PROPOFOL 10 MG/ML IV BOLUS
INTRAVENOUS | Status: AC
  Filled 2015-01-01: qty 20

## 2015-01-01 MED ORDER — DIPHENHYDRAMINE HCL 50 MG/ML IJ SOLN: 12.5000 mg | Freq: Four times a day (QID) | INTRAMUSCULAR | Status: DC | PRN

## 2015-01-01 MED ORDER — STERILE WATER FOR IRRIGATION IR SOLN
Status: DC | PRN
  Administered 2015-01-01: 3000 mL

## 2015-01-01 MED ORDER — DEXAMETHASONE SODIUM PHOSPHATE 10 MG/ML IJ SOLN
INTRAMUSCULAR | Status: DC | PRN
  Administered 2015-01-01: 4 mg via INTRAVENOUS

## 2015-01-01 MED ORDER — FENTANYL CITRATE 0.05 MG/ML IJ SOLN
INTRAMUSCULAR | Status: DC | PRN
  Administered 2015-01-01 (×3): 100 ug via INTRAVENOUS
  Administered 2015-01-01: 50 ug via INTRAVENOUS
  Administered 2015-01-01: 150 ug via INTRAVENOUS

## 2015-01-01 MED ORDER — LACTATED RINGERS IV SOLN
INTRAVENOUS | Status: DC
  Administered 2015-01-01 – 2015-01-02 (×3): via INTRAVENOUS

## 2015-01-01 MED ORDER — PNEUMOCOCCAL VAC POLYVALENT 25 MCG/0.5ML IJ INJ
0.5000 mL | INJECTION | INTRAMUSCULAR | Status: AC
  Administered 2015-01-02: 0.5 mL via INTRAMUSCULAR
  Filled 2015-01-01 (×2): qty 0.5

## 2015-01-01 MED ORDER — IBUPROFEN 600 MG PO TABS
600.0000 mg | ORAL_TABLET | Freq: Four times a day (QID) | ORAL | Status: DC | PRN
  Administered 2015-01-02 – 2015-01-03 (×3): 600 mg via ORAL
  Filled 2015-01-01 (×3): qty 1

## 2015-01-01 MED ORDER — DEXAMETHASONE SODIUM PHOSPHATE 4 MG/ML IJ SOLN
INTRAMUSCULAR | Status: AC
  Filled 2015-01-01: qty 1

## 2015-01-01 MED ORDER — BUPIVACAINE HCL (PF) 0.25 % IJ SOLN
INTRAMUSCULAR | Status: DC | PRN
  Administered 2015-01-01: 10 mL

## 2015-01-01 MED ORDER — ONDANSETRON HCL 4 MG/2ML IJ SOLN
INTRAMUSCULAR | Status: DC | PRN
  Administered 2015-01-01: 4 mg via INTRAVENOUS

## 2015-01-01 MED ORDER — HYDROMORPHONE HCL 1 MG/ML IJ SOLN
INTRAMUSCULAR | Status: DC | PRN
  Administered 2015-01-01: 1 mg via INTRAVENOUS

## 2015-01-01 MED ORDER — SCOPOLAMINE 1 MG/3DAYS TD PT72
MEDICATED_PATCH | TRANSDERMAL | Status: AC
  Administered 2015-01-01: 1.5 mg via TRANSDERMAL
  Filled 2015-01-01: qty 1

## 2015-01-01 MED ORDER — SCOPOLAMINE 1 MG/3DAYS TD PT72
1.0000 | MEDICATED_PATCH | Freq: Once | TRANSDERMAL | Status: DC
  Administered 2015-01-01: 1.5 mg via TRANSDERMAL

## 2015-01-01 MED ORDER — ONDANSETRON HCL 4 MG/2ML IJ SOLN
INTRAMUSCULAR | Status: AC
  Filled 2015-01-01: qty 2

## 2015-01-01 MED ORDER — ARTIFICIAL TEARS OP OINT
TOPICAL_OINTMENT | OPHTHALMIC | Status: AC
  Filled 2015-01-01: qty 3.5

## 2015-01-01 MED ORDER — ONDANSETRON HCL 4 MG/2ML IJ SOLN: 4.0000 mg | Freq: Four times a day (QID) | INTRAMUSCULAR | Status: DC | PRN

## 2015-01-01 MED ORDER — MENTHOL 3 MG MT LOZG: 1.0000 | LOZENGE | OROMUCOSAL | Status: DC | PRN

## 2015-01-01 MED ORDER — NEOSTIGMINE METHYLSULFATE 10 MG/10ML IV SOLN
INTRAVENOUS | Status: DC | PRN
  Administered 2015-01-01: 4 mg via INTRAVENOUS

## 2015-01-01 MED ORDER — ROPIVACAINE HCL 5 MG/ML IJ SOLN
INTRAMUSCULAR | Status: AC
  Filled 2015-01-01: qty 60

## 2015-01-01 MED ORDER — PROPOFOL 10 MG/ML IV BOLUS
INTRAVENOUS | Status: DC | PRN
  Administered 2015-01-01: 180 mg via INTRAVENOUS

## 2015-01-01 MED ORDER — GLYCOPYRROLATE 0.2 MG/ML IJ SOLN
INTRAMUSCULAR | Status: AC
  Filled 2015-01-01: qty 3

## 2015-01-01 MED ORDER — DIPHENHYDRAMINE HCL 12.5 MG/5ML PO ELIX: 12.5000 mg | ORAL_SOLUTION | Freq: Four times a day (QID) | ORAL | Status: DC | PRN

## 2015-01-01 MED ORDER — OXYCODONE-ACETAMINOPHEN 5-325 MG PO TABS
1.0000 | ORAL_TABLET | ORAL | Status: DC | PRN
  Administered 2015-01-02: 2 via ORAL
  Administered 2015-01-02 (×2): 1 via ORAL
  Administered 2015-01-02: 2 via ORAL
  Administered 2015-01-02: 1 via ORAL
  Administered 2015-01-03 (×2): 2 via ORAL
  Filled 2015-01-01 (×4): qty 2
  Filled 2015-01-01: qty 1
  Filled 2015-01-01: qty 2
  Filled 2015-01-01: qty 1

## 2015-01-01 MED ORDER — LEVOTHYROXINE SODIUM 137 MCG PO TABS
137.0000 ug | ORAL_TABLET | Freq: Every day | ORAL | Status: DC
  Administered 2015-01-02 – 2015-01-03 (×2): 137 ug via ORAL
  Filled 2015-01-01 (×2): qty 1

## 2015-01-01 MED ORDER — LIDOCAINE HCL (CARDIAC) 20 MG/ML IV SOLN
INTRAVENOUS | Status: AC
  Filled 2015-01-01: qty 5

## 2015-01-01 MED ORDER — KETOROLAC TROMETHAMINE 30 MG/ML IJ SOLN
30.0000 mg | Freq: Four times a day (QID) | INTRAMUSCULAR | Status: AC
  Administered 2015-01-01 – 2015-01-02 (×3): 30 mg via INTRAVENOUS
  Filled 2015-01-01 (×3): qty 1

## 2015-01-01 MED ORDER — SODIUM CHLORIDE 0.9 % IJ SOLN
INTRAMUSCULAR | Status: AC
  Filled 2015-01-01: qty 10

## 2015-01-01 MED ORDER — BUPIVACAINE HCL (PF) 0.25 % IJ SOLN
INTRAMUSCULAR | Status: AC
  Filled 2015-01-01: qty 30

## 2015-01-01 MED ORDER — PROMETHAZINE HCL 25 MG/ML IJ SOLN: 6.2500 mg | INTRAMUSCULAR | Status: DC | PRN

## 2015-01-01 MED ORDER — MORPHINE SULFATE (PF) 1 MG/ML IV SOLN
INTRAVENOUS | Status: DC
  Administered 2015-01-01: 8 mL via INTRAVENOUS
  Administered 2015-01-01: 9 mg via INTRAVENOUS
  Administered 2015-01-01: 12:00:00 via INTRAVENOUS
  Administered 2015-01-01: 16 mL via INTRAVENOUS
  Administered 2015-01-02 (×2): 6 mg via INTRAVENOUS
  Filled 2015-01-01 (×2): qty 25

## 2015-01-01 MED ORDER — SODIUM CHLORIDE 0.9 % IJ SOLN: 9.0000 mL | INTRAMUSCULAR | Status: DC | PRN

## 2015-01-01 MED ORDER — ROCURONIUM BROMIDE 100 MG/10ML IV SOLN
INTRAVENOUS | Status: DC | PRN
  Administered 2015-01-01: 50 mg via INTRAVENOUS
  Administered 2015-01-01 (×2): 20 mg via INTRAVENOUS
  Administered 2015-01-01: 10 mg via INTRAVENOUS

## 2015-01-01 SURGICAL SUPPLY — 75 items
APL SKNCLS STERI-STRIP NONHPOA (GAUZE/BANDAGES/DRESSINGS) ×3
BARRIER ADHS 3X4 INTERCEED (GAUZE/BANDAGES/DRESSINGS) IMPLANT
BENZOIN TINCTURE PRP APPL 2/3 (GAUZE/BANDAGES/DRESSINGS) ×4 IMPLANT
BRR ADH 4X3 ABS CNTRL BYND (GAUZE/BANDAGES/DRESSINGS)
CANISTER SUCT 3000ML (MISCELLANEOUS) ×5 IMPLANT
CATH FOLEY 3WAY  5CC 16FR (CATHETERS) ×2
CATH FOLEY 3WAY 5CC 16FR (CATHETERS) ×3 IMPLANT
CLOTH BEACON ORANGE TIMEOUT ST (SAFETY) ×5 IMPLANT
CONT PATH 16OZ SNAP LID 3702 (MISCELLANEOUS) ×10 IMPLANT
COVER BACK TABLE 60X90IN (DRAPES) ×10 IMPLANT
COVER TIP SHEARS 8 DVNC (MISCELLANEOUS) ×6 IMPLANT
COVER TIP SHEARS 8MM DA VINCI (MISCELLANEOUS) ×4
DECANTER SPIKE VIAL GLASS SM (MISCELLANEOUS) ×10 IMPLANT
DRAPE WARM FLUID 44X44 (DRAPE) ×5 IMPLANT
DRSG OPSITE POSTOP 4X10 (GAUZE/BANDAGES/DRESSINGS) ×5 IMPLANT
DURAPREP 26ML APPLICATOR (WOUND CARE) ×5 IMPLANT
ELECT REM PT RETURN 9FT ADLT (ELECTROSURGICAL) ×5
ELECTRODE REM PT RTRN 9FT ADLT (ELECTROSURGICAL) ×3 IMPLANT
GAUZE VASELINE 3X9 (GAUZE/BANDAGES/DRESSINGS) IMPLANT
GLOVE BIO SURGEON STRL SZ 6.5 (GLOVE) ×12 IMPLANT
GLOVE BIO SURGEONS STRL SZ 6.5 (GLOVE) ×3
GLOVE BIOGEL PI IND STRL 6.5 (GLOVE) ×3 IMPLANT
GLOVE BIOGEL PI IND STRL 7.0 (GLOVE) ×6 IMPLANT
GLOVE BIOGEL PI INDICATOR 6.5 (GLOVE) ×2
GLOVE BIOGEL PI INDICATOR 7.0 (GLOVE) ×4
GOWN STRL REUS W/TWL LRG LVL3 (GOWN DISPOSABLE) ×15 IMPLANT
KIT ACCESSORY DA VINCI DISP (KITS) ×2
KIT ACCESSORY DVNC DISP (KITS) ×3 IMPLANT
LEGGING LITHOTOMY PAIR STRL (DRAPES) ×5 IMPLANT
LIQUID BAND (GAUZE/BANDAGES/DRESSINGS) ×5 IMPLANT
NDL HYPO 25X1 1.5 SAFETY (NEEDLE) IMPLANT
NEEDLE HYPO 25X1 1.5 SAFETY (NEEDLE) IMPLANT
NS IRRIG 1000ML POUR BTL (IV SOLUTION) ×5 IMPLANT
OCCLUDER COLPOPNEUMO (BALLOONS) ×3 IMPLANT
PACK ABDOMINAL GYN (CUSTOM PROCEDURE TRAY) ×5 IMPLANT
PACK ROBOT WH (CUSTOM PROCEDURE TRAY) ×5 IMPLANT
PACK ROBOTIC GOWN (GOWN DISPOSABLE) ×5 IMPLANT
PAD OB MATERNITY 4.3X12.25 (PERSONAL CARE ITEMS) ×5 IMPLANT
PAD POSITIONER PINK NONSTERILE (MISCELLANEOUS) ×5 IMPLANT
PAD PREP 24X48 CUFFED NSTRL (MISCELLANEOUS) ×10 IMPLANT
PROTECTOR NERVE ULNAR (MISCELLANEOUS) ×5 IMPLANT
SET BI-LUMEN FLTR TB AIRSEAL (TUBING) IMPLANT
SET CYSTO W/LG BORE CLAMP LF (SET/KITS/TRAYS/PACK) ×4 IMPLANT
SET IRRIG TUBING LAPAROSCOPIC (IRRIGATION / IRRIGATOR) ×10 IMPLANT
SHEET LAVH (DRAPES) ×5 IMPLANT
SPONGE LAP 18X18 X RAY DECT (DISPOSABLE) ×13 IMPLANT
STAPLER VISISTAT 35W (STAPLE) ×5 IMPLANT
SUT PLAIN 2 0 XLH (SUTURE) IMPLANT
SUT VIC AB 0 CT1 18XCR BRD8 (SUTURE) ×8 IMPLANT
SUT VIC AB 0 CT1 27 (SUTURE) ×15
SUT VIC AB 0 CT1 27XBRD ANBCTR (SUTURE) ×9 IMPLANT
SUT VIC AB 0 CT1 8-18 (SUTURE) ×20
SUT VIC AB 0 CT2 27 (SUTURE) ×25 IMPLANT
SUT VIC AB 2-0 CT1 27 (SUTURE) ×5
SUT VIC AB 2-0 CT1 TAPERPNT 27 (SUTURE) ×3 IMPLANT
SUT VIC AB 4-0 PS2 27 (SUTURE) ×10 IMPLANT
SUT VICRYL 0 TIES 12 18 (SUTURE) ×5 IMPLANT
SUT VICRYL 0 UR6 27IN ABS (SUTURE) ×10 IMPLANT
SUT VLOC 180 0 9IN  GS21 (SUTURE)
SUT VLOC 180 0 9IN GS21 (SUTURE) IMPLANT
SYR 50ML LL SCALE MARK (SYRINGE) ×10 IMPLANT
SYR CONTROL 10ML LL (SYRINGE) IMPLANT
SYSTEM CONVERTIBLE TROCAR (TROCAR) IMPLANT
TIP RUMI ORANGE 6.7MMX12CM (TIP) IMPLANT
TIP UTERINE 5.1X6CM LAV DISP (MISCELLANEOUS) IMPLANT
TIP UTERINE 6.7X10CM GRN DISP (MISCELLANEOUS) IMPLANT
TIP UTERINE 6.7X6CM WHT DISP (MISCELLANEOUS) IMPLANT
TIP UTERINE 6.7X8CM BLUE DISP (MISCELLANEOUS) IMPLANT
TOWEL OR 17X24 6PK STRL BLUE (TOWEL DISPOSABLE) ×15 IMPLANT
TRAY FOLEY BAG SILVER LF 16FR (CATHETERS) ×5 IMPLANT
TROCAR DILATING TIP 12MM 150MM (ENDOMECHANICALS) ×5 IMPLANT
TROCAR DISP BLADELESS 8 DVNC (TROCAR) ×3 IMPLANT
TROCAR DISP BLADELESS 8MM (TROCAR) ×2
WARMER LAPAROSCOPE (MISCELLANEOUS) ×5 IMPLANT
WATER STERILE IRR 1000ML POUR (IV SOLUTION) ×15 IMPLANT

## 2015-01-01 NOTE — Transfer of Care (Signed)
Immediate Anesthesia Transfer of Care Note  Patient: [name]  Procedure(s) Performed: Procedure(s): BILATERAL SALPINGECTOMY         (Bilateral)  TOTAL ABDOMINAL HYSTERECTOMY  (N/A)  Patient Location: PACU  Anesthesia Type:General  Level of Consciousness: awake, alert  and oriented  Airway & Oxygen Therapy: Patient Spontanous Breathing and Patient connected to nasal cannula oxygen  Post-op Assessment: Report given to RN and Post -op Vital signs reviewed and stable  Post vital signs: Reviewed and stable  Last Vitals:  Filed Vitals:   01/01/15 0556  BP: 123/79  Pulse: 76  Temp: 36.8 C  Resp: 20    Complications: No apparent anesthesia complications

## 2015-01-01 NOTE — Brief Op Note (Signed)
01/01/2015  10:31 AM  PATIENT:  [name]  49 y.o. female  PRE-OPERATIVE DIAGNOSIS:  menorrhagia,fibroids  POST-OPERATIVE DIAGNOSIS:  menorrhagia, fibroids, pelvic adhesions  PROCEDURE:  Procedure(s): BILATERAL SALPINGECTOMY         (Bilateral)  TOTAL ABDOMINAL HYSTERECTOMY  (N/A) LYSIS OF ADHESIONS  SURGEON:  Surgeon(s) and Role:    * Julee Stoll E Amundson de Berton Lan, MD - Primary    * Lyman Speller, MD - Assisting  PHYSICIAN ASSISTANT:   ASSISTANTS:  Lyman Speller, MD   ANESTHESIA:   local and general  EBL:  Total I/O In: 2000 [I.V.:2000] Out: 275 [Urine:150; Blood:125]  BLOOD ADMINISTERED:none  DRAINS: Urinary Catheter (Foley)   LOCAL MEDICATIONS USED:  MARCAINE    and Amount:  10 ml  SPECIMEN:  Source of Specimen:   Uterus, cervix, bilateral tubes.  DISPOSITION OF SPECIMEN:  PATHOLOGY  COUNTS:  YES  TOURNIQUET:  * No tourniquets in log *  DICTATION: .Other Dictation: Dictation Number    PLAN OF CARE: Admit to inpatient   PATIENT DISPOSITION:  PACU - hemodynamically stable.   Delay start of Pharmacological VTE agent (>24hrs) due to surgical blood loss or risk of bleeding: not applicable

## 2015-01-01 NOTE — Addendum Note (Signed)
Addendum  created 01/01/15 1708 by Asher Muir, CRNA   Modules edited: Notes Section   Notes Section:  File: [id number]

## 2015-01-01 NOTE — Anesthesia Postprocedure Evaluation (Signed)
  Anesthesia Post-op Note  Patient: [name]  Procedure(s) Performed: Procedure(s): BILATERAL SALPINGECTOMY         (Bilateral)  TOTAL ABDOMINAL HYSTERECTOMY  (N/A)  Patient Location: PACU  Anesthesia Type:General  Level of Consciousness: awake, alert  and oriented  Airway and Oxygen Therapy: Patient Spontanous Breathing and Patient connected to nasal cannula oxygen  Post-op Pain: mild  Post-op Assessment: Post-op Vital signs reviewed, Patient's Cardiovascular Status Stable, Respiratory Function Stable, Patent Airway, No signs of Nausea or vomiting and Pain level controlled  Post-op Vital Signs: Reviewed and stable  Last Vitals:  Filed Vitals:   01/01/15 1130  BP: 127/81  Pulse: 75  Temp:   Resp: 16    Complications: No apparent anesthesia complications

## 2015-01-01 NOTE — Anesthesia Postprocedure Evaluation (Signed)
Anesthesia Post Note  Patient: [name]  Procedure(s) Performed: Procedure(s) (LRB): BILATERAL SALPINGECTOMY         (Bilateral)  TOTAL ABDOMINAL HYSTERECTOMY  (N/A)  Anesthesia type: General  Patient location: [facility]  Post pain: Pain level controlled  Post assessment: Post-op Vital signs reviewed  Last Vitals:  Filed Vitals:   01/01/15 1658  BP: 113/78  Pulse: 90  Temp: 36.7 C  Resp: 21    Post vital signs: Reviewed  Level of consciousness: sedated  Complications: No apparent anesthesia complications

## 2015-01-01 NOTE — Anesthesia Preprocedure Evaluation (Signed)
Anesthesia Evaluation  Patient identified by MRN, date of birth, ID band  Reviewed: Allergy & Precautions, H&P , NPO status , Patient's Chart, lab work & pertinent test results  Airway Mallampati: II  TM Distance: >3 FB Neck ROM: full    Dental no notable dental hx. (+) Teeth Intact   Pulmonary neg pulmonary ROS, Current Smoker,    Pulmonary exam normal       Cardiovascular negative cardio ROS      Neuro/Psych negative neurological ROS     GI/Hepatic negative GI ROS, Neg liver ROS,   Endo/Other  Hypothyroidism   Renal/GU negative Renal ROS     Musculoskeletal   Abdominal Normal abdominal exam  (+)   Peds  Hematology negative hematology ROS (+)   Anesthesia Other Findings   Reproductive/Obstetrics negative OB ROS                             Anesthesia Physical Anesthesia Plan  ASA: II  Anesthesia Plan: General   Post-op Pain Management:    Induction: Intravenous  Airway Management Planned: Oral ETT  Additional Equipment:   Intra-op Plan:   Post-operative Plan: Extubation in OR  Informed Consent: I have reviewed the patients History and Physical, chart, labs and discussed the procedure including the risks, benefits and alternatives for the proposed anesthesia with the patient or authorized representative who has indicated his/her understanding and acceptance.   Dental Advisory Given  Plan Discussed with: CRNA, Surgeon and Anesthesiologist  Anesthesia Plan Comments:         Anesthesia Quick Evaluation

## 2015-01-01 NOTE — Progress Notes (Signed)
Day of Surgery Procedure(s) (LRB): BILATERAL SALPINGECTOMY         (Bilateral)  TOTAL ABDOMINAL HYSTERECTOMY  (N/A)  Subjective: Patient reports tolerating PO.   Taking clear liquids and wants to eat food.  Good pain control.  Ambulated once.   Objective: I have reviewed patient's vital signs and intake and output.  General: alert Resp: clear to auscultation bilaterally Cardio: regular rate and rhythm, S1, S2 normal, no murmur, click, rub or gallop GI: soft, non-tender; bowel sounds normal; no masses,  no organomegaly and incision: intact and  Dried blood.  Extremities:  PAS and Ted hose on.   DPs 2+. Vaginal Bleeding: none  Assessment: s/p Procedure(s): BILATERAL SALPINGECTOMY         (Bilateral)  TOTAL ABDOMINAL HYSTERECTOMY  (N/A): stable  Plan: Advance diet Continue foley due to  post operative state.   CBC and BMP in am.  Reviewed surgical procedure and findings.  Questions answered.   LOS: 0 days    Arloa Koh 01/01/2015, 5:59 PM

## 2015-01-01 NOTE — Progress Notes (Signed)
Update to History and Physical  Finished course of Levaquin for respiratory infection.  Patient examined.   OK to proceed with surgery.  Patient prepared for possible total abdominal hysterectomy based on uterine size while under anesthesia.

## 2015-01-02 ENCOUNTER — Encounter (HOSPITAL_COMMUNITY): Payer: Self-pay | Admitting: Obstetrics and Gynecology

## 2015-01-02 ENCOUNTER — Telehealth: Payer: Self-pay | Admitting: Obstetrics and Gynecology

## 2015-01-02 LAB — CBC
HCT: 35.6 % — ABNORMAL LOW (ref 36.0–46.0)
HEMOGLOBIN: 11.8 g/dL — AB (ref 12.0–15.0)
MCH: 31.6 pg (ref 26.0–34.0)
MCHC: 33.1 g/dL (ref 30.0–36.0)
MCV: 95.4 fL (ref 78.0–100.0)
Platelets: 269 10*3/uL (ref 150–400)
RBC: 3.73 MIL/uL — AB (ref 3.87–5.11)
RDW: 15.8 % — ABNORMAL HIGH (ref 11.5–15.5)
WBC: 10.1 10*3/uL (ref 4.0–10.5)

## 2015-01-02 LAB — BASIC METABOLIC PANEL
Anion gap: 3 — ABNORMAL LOW (ref 5–15)
BUN: 10 mg/dL (ref 6–23)
CHLORIDE: 106 mmol/L (ref 96–112)
CO2: 30 mmol/L (ref 19–32)
CREATININE: 0.84 mg/dL (ref 0.50–1.10)
Calcium: 8.1 mg/dL — ABNORMAL LOW (ref 8.4–10.5)
GFR calc Af Amer: >90 mL/min (ref >90)
GFR calc non Af Amer: 81 mL/min — ABNORMAL LOW (ref >90)
GLUCOSE: 101 mg/dL — AB (ref 70–99)
POTASSIUM: 3.8 mmol/L (ref 3.5–5.1)
Sodium: 139 mmol/L (ref 135–145)

## 2015-01-02 NOTE — Progress Notes (Signed)
1 Day Post-Op Procedure(s) (LRB): BILATERAL SALPINGECTOMY         (Bilateral)  TOTAL ABDOMINAL HYSTERECTOMY  (N/A)  Subjective: Patient reports doing well.  On oral pain medication.  Tolerating regular diet.  Ambulating.   Objective: I have reviewed patient's vital signs, intake and output and labs. UO 2350 cc.  Hgb 11.8.  General: alert Resp: clear to auscultation bilaterally Cardio: regular rate and rhythm, S1, S2 normal, no murmur, click, rub or gallop GI: soft, non-tender; bowel sounds normal; no masses,  no organomegaly and incision: Dried blood.  Vaginal Bleeding: none  Assessment: s/p Procedure(s): BILATERAL SALPINGECTOMY         (Bilateral)  TOTAL ABDOMINAL HYSTERECTOMY  (N/A): progressing well  Plan: Advance to PO medication  Ambulate and shower.   Possible discharge later this afternoon.   LOS: 1 day    Arloa Koh 01/02/2015, 8:02 AM

## 2015-01-02 NOTE — Op Note (Signed)
[name], SCARBORO NO.:  [id number]  MEDICAL RECORD NO.:  [id number]  LOCATION:  9306                          FACILITY:  [facility]  PHYSICIAN:  Lenard Galloway, M.D.   DATE OF BIRTH:  27-Jan-1966  DATE OF PROCEDURE:  01/01/2015 DATE OF DISCHARGE:                              OPERATIVE REPORT   PREOPERATIVE DIAGNOSIS:  Symptomatic uterine fibroids.  POSTOPERATIVE DIAGNOSIS:  Symptomatic uterine fibroids, pelvic adhesions.  PROCEDURE:  Total abdominal hysterectomy with bilateral salpingectomy, lysis of adhesions.  SURGEON:  Lenard Galloway MD.  ASSISTANT:  Lyman Speller, MD  ANESTHESIA:  General endotracheal, local with 0.25% Marcaine.  IV FLUIDS:  2000 mL Ringer's lactate.  EBL:  125 mL.  URINE OUTPUT:  150 mL.  COMPLICATIONS:  None.  INDICATIONS FOR PROCEDURE:  The patient is a 49 year old para 48 Caucasian female, status post bilateral tubal ligation, who presents with heavy and irregular menstrual bleeding.  Pelvic ultrasound confirms the presence of multiple uterine fibroids and normal ovaries.  The patient declines future childbearing and she wishes to proceed with a hysterectomy.  Plan is made to proceed with a possible da Vinci robotic total laparoscopic hysterectomy with bilateral salpingectomy and cystoscopy versus a total abdominal hysterectomy with bilateral salpingectomy.  The plan will depend on the uterine size with the patient under anesthesia.  Risks, benefits, and alternatives have been reviewed with the patient who wishes to proceed.  FINDINGS:  Exam under anesthesia revealed a 17 week size uterus.  No adnexal masses were appreciated.  At the time of laparotomy, the patient was noted to have a multi-fibroid uterus with intramural and subserosal fibroids.  A small 2 cm anterior right fundal fibroid was adhered to the anterior abdominal wall.  The patient did have some scarring between her lower uterine segment and the bladder.   There were some sigmoid colon adhesions over the patient's left pelvic brim and some small omental adhesions to the anterior abdominal wall.  All of these adhesions were lysed.  There was a 1.5 cm hemorrhagic cyst off the left ovary.  The right ovary was unremarkable. The fallopian tubes were consistent with prior tubal ligation.  There was no evidence of any endometriosis.  SPECIMEN:  The uterus, cervix with bilateral fallopian tubes were all sent to Pathology together and the total weight was 497 g.  PROCEDURE IN DETAIL:  The patient was reidentified in the preop hold area.  She received cefotetan 2 g IV for antibiotic prophylaxis and she received TED hose and PAS stockings for DVT prophylaxis.  In the operating room, the patient was placed in the dorsal lithotomy position with Allen stirrups.  The patient was placed on the anti-slip pink pad and her arms were appropriately padded at her sides with arm protectors.  General endotracheal anesthesia was induced.  An exam under anesthesia was performed and a decision was made to proceed with an abdominal hysterectomy.  The patient's abdomen and vagina were sterilely prepped and she was then sterilely draped.  A 3- way Foley catheter was placed inside the bladder.  The procedure began by creating a Pfannenstiel incision with a scalpel along the patient's prior cesarean section incision.  Monopolar cautery was used to create hemostasis through the subcutaneous layer.  The fascia was then incised in the midline with a scalpel and the incision was extended bilaterally with Mayo scissors.  Hemostasis was created with monopolar cautery as the fascia was dissected away from the rectus muscles and the pyramidalis muscles.  The scar tissue above the peritoneum was grasped with 2 hemostat clamps and sharp dissection with a scalpel was used to create entry into the peritoneal cavity.  The peritoneal incision was then extended cranially and  caudally.  The rectus muscles were divided inferiorly using sharp dissection with Mayo scissors.  At this time, the uterus was delivered up through the peritoneal incision.  Attention was turned to the omental adhesions and the large bowel adhesion along the left pelvic brim and these were lysed sharply. The adhesion between the anterior fundal fibroid and the anterior peritoneum was lysed with  Metzenbaum scissors.  At this time, 2 Kelly clamps were placed across the adnexal structures bilaterally.  Salpingectomy was performed bilaterally at this time.  The left fallopian tube was grasped along the mesosalpinx with a Kelly clamp.  The mesosalpinx was then cauterized with monopolar cautery.  The fallopian tube was completely excised using the monopolar cautery.  A free tie of 0 Vicryl was placed along this pedicle for hemostasis. The same procedure was performed on the patient's right-hand side.  The tubes were set aside and later sent to pathology with the uterus and cervix.  The left round ligament was grasped and was suture ligated with a transfixing suture of 0 Vicryl.  The round ligament was then divided with monopolar cautery.  The peritoneum was opened anteriorly and posteriorly using monopolar cautery and the Metzenbaum scissors.  The round ligament at this time was re-tied with free tie of 0 Vicryl in order to create better hemostasis of the round ligament.  The right round ligament at this time was elevated with a Babcock clamp and suture ligated with a transfixing suture of 0 Vicryl.  The peritoneum was opened anteriorly and posteriorly using monopolar cautery and Metzenbaum scissors.  The bladder flap was taken down sharply using Metzenbaum scissors.  Careful dissection was performed in this area to go through the scar and the vesicouterine fold of peritoneum, which came down nicely and without complication.  A window was created through the posterior broad ligament on  each of the patient's left and right hand side so that each of the utero-ovarian ligaments could be clamped, sharply divided, and then free tied with 0 Vicryl free tie followed by a suture ligature of the same.  Hemostasis was good.  The uterine arteries were then skeletonized.  Each artery was doubly clamped and sharply divided and then suture ligated with 2 sutures of 0 Vicryl.  At this time, the uterine fundus was amputated from the cervix using monopolar cautery.  The fundus was sent to Pathology along with the bilateral fallopian tubes.  The bladder was further dissected off the cervix anteriorly.  The inferior aspect of the cardinal ligaments were then clamped with Heaney clamps, sharply divided, and suture ligated with 0 Vicryl bilaterally. Eventually, the uterosacral ligaments were reached and they were clamped with curved Heaney clamps, sharply divided, and suture ligated with transfixing sutures of 0 Vicryl.  Entry into the vagina was possible at this time.  The cervix was circumscribed from the vaginal cuff using curved Mayo scissors.  The cervix was then sent to Pathology along with the rest of the  specimen.  A figure-of-eight suture was placed across the vaginal cuff and this closed the cuff.  The pelvis was irrigated at this time and suctioned and examined for evidence of any bleeding.  There was some bleeding along the posterior peritoneum of the vaginal cuff and this responded well to monopolar cautery.  Previously the left ovary had been bleeding slightly and this did respond to monopolar cautery as well.  The ureters were identified Bilaterally and noted to peristalse.  The ovarian pedicles were examined and there was a small hematoma noted along the peritoneum of the ovarian pedicle.  This was anterior to the infundibulopelvic ligament.  This was observed and found to be stable and nonexpanding.  This was a small hematoma.  At this time, the self-retaining  retractor and the moistened lap pads which had been placed were removed from the peritoneal cavity.  The abdomen was closed.  The parietal peritoneum was closed with a running suture of 2-0 Vicryl.  The rectus layer was irrigated and suctioned.  Hemostasis was created with monopolar cautery at a few points along the rectus and the pyramidalis muscles.  Hemostasis was then good.  The fascia was closed with a running suture of 0 Vicryl. The subcutaneous layer was irrigated, suctioned, and made hemostatic with monopolar cautery.  Interrupted sutures of 3-0 plain were placed in the subcutaneous layer.  The subcutaneous layer was injected along with a dermal layer with a total of 10 mL of 0.25% Marcaine.  Hemostasis was good.  The skin was closed with a subcuticular suture of 4-0 Vicryl. Steri-Strips and benzoin were placed over the incision.  This concluded the patient's procedure.  There were no complications. All needle, instrument, and sponge counts were correct.  The patient was escorted to the recovery room in stable and awake condition.     Lenard Galloway, M.D.     BES/MEDQ  D:  01/01/2015  T:  01/02/2015  Job:  [id number]

## 2015-01-02 NOTE — Telephone Encounter (Signed)
Error please disrregard

## 2015-01-02 NOTE — Progress Notes (Signed)
1 Day Post-Op Procedure(s) (LRB): BILATERAL SALPINGECTOMY         (Bilateral)  TOTAL ABDOMINAL HYSTERECTOMY  (N/A)  Subjective: Patient reports + flatus.    Objective: I have reviewed patient's vital signs, medications and pathology. 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  Fibroids.   GI: incision:  dried blood.  Steristrips on.   Assessment: s/p Procedure(s): BILATERAL SALPINGECTOMY         (Bilateral)  TOTAL ABDOMINAL HYSTERECTOMY  (N/A): progressing well  Plan:  Shower tonight.   Will keep overnight and discharge to home tomorrow.  Pathology report discussed.    LOS: 1 day    Arloa Koh 01/02/2015, 6:30 PM

## 2015-01-03 ENCOUNTER — Telehealth: Payer: Self-pay | Admitting: Obstetrics and Gynecology

## 2015-01-03 MED ORDER — DOCUSATE SODIUM 100 MG PO CAPS: 100.0000 mg | ORAL_CAPSULE | Freq: Two times a day (BID) | ORAL | Status: DC

## 2015-01-03 MED ORDER — IBUPROFEN 600 MG PO TABS: 600.0000 mg | ORAL_TABLET | Freq: Four times a day (QID) | ORAL | Status: DC | PRN

## 2015-01-03 MED ORDER — OXYCODONE-ACETAMINOPHEN 5-325 MG PO TABS: 1.0000 | ORAL_TABLET | ORAL | Status: DC | PRN

## 2015-01-03 NOTE — Progress Notes (Signed)
Ambulated out teaching complete  

## 2015-01-03 NOTE — Discharge Instructions (Signed)
Abdominal Hysterectomy, Care After Refer to this sheet in the next few weeks. These instructions provide you with information on caring for yourself after your procedure. Your health care provider may also give you more specific instructions. Your treatment has been planned according to current medical practices, but problems sometimes occur. Call your health care provider if you have any problems or questions after your procedure.  WHAT TO EXPECT AFTER THE PROCEDURE After your procedure, it is typical to have the following:  Pain.  Feeling tired.  Poor appetite.  Less interest in sex. HOME CARE INSTRUCTIONS  It takes 4-6 weeks to recover from this surgery. Make sure you follow all your health care provider's instructions. Home care instructions may include:  Take pain medicines only as directed by your health care provider. Do not take over-the-counter pain medicines without checking with your health care provider first.  Change your bandage as directed by your health care provider.  Return to your health care provider to have your sutures taken out.  Take showers instead of baths for 2-3 weeks. Ask your health care provider when it is safe to start showering.  Do not douche, use tampons, or have sexual intercourse for at least 6 weeks or until your health care provider says you can.   Follow your health care provider's advice about exercise, lifting, driving, and general activities.  Get plenty of rest and sleep.   Do not lift anything heavier than a gallon of milk (about 10 lb [4.5 kg]) for the first month after surgery.  You can resume your normal diet if your health care provider says it is okay.   Do not drink alcohol until your health care provider says you can.   If you are constipated, ask your health care provider if you can take a mild laxative.  Eating foods high in fiber may also help with constipation. Eat plenty of raw fruits and vegetables, whole grains, and  beans.  Drink enough fluids to keep your urine clear or pale yellow.   Try to have someone at home with you for the first 1-2 weeks to help around the house.  Keep all follow-up appointments. SEEK MEDICAL CARE IF:   You have chills or fever.  You have swelling, redness, or pain in the area of your incision that is getting worse.   You have pus coming from the incision.   You notice a bad smell coming from the incision or bandage.   Your incision breaks open.   You feel dizzy or light-headed.   You have pain or bleeding when you urinate.   You have persistent diarrhea.   You have persistent nausea and vomiting.   You have abnormal vaginal discharge.   You have a rash.   You have any type of abnormal reaction or develop an allergy to your medicine.   Your pain medicine is not helping.  SEEK IMMEDIATE MEDICAL CARE IF:   You have a fever and your symptoms suddenly get worse.  You have severe abdominal pain.  You have chest pain.  You have shortness of breath.  You faint.  You have pain, swelling, or redness of your leg.  You have heavy vaginal bleeding with blood clots. MAKE SURE YOU:  Understand these instructions.  Will watch your condition.  Will get help right away if you are not doing well or get worse. Document Released: 05/22/2005 Document Revised: 11/07/2013 Document Reviewed: 08/25/2013 Exit[employer] Patient Information 2015 [employer]. This information is not intended   to replace advice given to you by your health care provider. Make sure you discuss any questions you have with your health care provider.  

## 2015-01-03 NOTE — Telephone Encounter (Signed)
[name] w/ [employer] calling requesting patient's expected return to work date.

## 2015-01-03 NOTE — Progress Notes (Signed)
2 Days Post-Op Procedure(s) (LRB): BILATERAL SALPINGECTOMY         (Bilateral)  TOTAL ABDOMINAL HYSTERECTOMY  (N/A)  Subjective: Patient reports tolerating PO and + flatus.   Ready for discharge.  Objective: I have reviewed patient's vital signs and intake and output.  General: alert GI: soft, non-tender; bowel sounds normal; no masses,  no organomegaly and incision:  Dried blood.   Assessment: s/p Procedure(s): BILATERAL SALPINGECTOMY         (Bilateral)  TOTAL ABDOMINAL HYSTERECTOMY  (N/A): progressing well  Ready for discharge.   Plan: Encourage ambulation Discharge home  Instructions and precautions reviewed in verbal and written form.  Rx for Percocet and Motrin.  Resume usual medications but do not take Vicodin or other NSAID. Follow up in 4 days.    LOS: 2 days    Arloa Koh 01/03/2015, 7:38 AM

## 2015-01-07 ENCOUNTER — Ambulatory Visit (INDEPENDENT_AMBULATORY_CARE_PROVIDER_SITE_OTHER): Payer: BLUE CROSS/BLUE SHIELD | Admitting: Obstetrics and Gynecology

## 2015-01-07 ENCOUNTER — Encounter: Payer: Self-pay | Admitting: Obstetrics and Gynecology

## 2015-01-07 VITALS — BP 122/72 | HR 76 | Ht 64.0 in | Wt 169.8 lb

## 2015-01-07 DIAGNOSIS — Z90710 Acquired absence of both cervix and uterus: Secondary | ICD-10-CM

## 2015-01-07 MED ORDER — OXYCODONE-ACETAMINOPHEN 5-325 MG PO TABS: 1.0000 | ORAL_TABLET | ORAL | Status: DC | PRN

## 2015-01-07 NOTE — Patient Instructions (Signed)
Try your Miralax or a glycerin suppository for constipation symptoms.

## 2015-01-07 NOTE — Progress Notes (Signed)
GYNECOLOGY  VISIT   HPI: 49 y.o.   Married  Caucasian  female   G1P1001 with Patient's last menstrual period was 12/05/2014 (approximate).   here for 1 week post op Status post TAH/bilateral salpingectomy.   Some left rib pain that goes away with movement.  Feels like gas pains. No shortness of breath.  Some chest pain 2 days ago.  Not passing gas and feels bloated. Tolerating food.  Voiding well.  No real bleeding or discharge from vagina.  Taking Percocet and Motrin.  GYNECOLOGIC HISTORY: Patient's last menstrual period was 12/05/2014 (approximate). Contraception:TAH Menopausal hormone therapy: None        OB History    Gravida Para Term Preterm AB TAB SAB Ectopic Multiple Living   1 1 1       1          Patient Active Problem List   Diagnosis Date Noted  . Status post total abdominal hysterectomy 01/01/2015  . Acute upper respiratory infection 12/18/2014  . Elevated blood pressure 12/18/2014  . Smoker 12/18/2014  . Other chest pain 09/10/2014  . Right flank pain 09/05/2014  . Recurrent low back pain 06/12/2014  . Malignant neoplasm of thyroid gland 09/07/2011  . 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 Hypocalcemia 08/31/2011  . Preventative health care 03/17/2011  . LUMBAR RADICULOPATHY, RIGHT 08/21/2008  . PARESTHESIA 07/30/2008  . HYPERLIPIDEMIA 07/18/2008  . ANXIETY 07/18/2008  . DEPRESSION 07/18/2008  . ALLERGIC RHINITIS 07/18/2008    Past Medical History  Diagnosis Date  . HYPERLIPIDEMIA     diet controlled, no meds  . DEPRESSION   . ANXIETY   . ALLERGIC RHINITIS   . Thyroid carcinoma   . Hypothyroidism   . Fibroid   . Anemia     Past Surgical History  Procedure Laterality Date  . Tubal ligation    . Total thyroidectomy  2009/2010  . Cesarean section  2002    x 1  . Wisdom tooth extraction    . Bilateral salpingectomy Bilateral 01/01/2015    Procedure: BILATERAL SALPINGECTOMY        ;  Surgeon: Everardo All Amundson de Berton Lan, MD;   Location: [facility];  Service: Gynecology;  Laterality: Bilateral;  . Abdominal hysterectomy N/A 01/01/2015    Procedure:  TOTAL ABDOMINAL HYSTERECTOMY ;  Surgeon: Everardo All Amundson de Berton Lan, MD;  Location: [facility];  Service: Gynecology;  Laterality: N/A;    Current Outpatient Prescriptions  Medication Sig Dispense Refill  . BIOTIN PO Take 1 tablet by mouth daily.    Marland Kitchen levothyroxine (SYNTHROID, LEVOTHROID) 137 MCG tablet Take 1 tablet (137 mcg total) by mouth daily before breakfast. 30 tablet 11  . Omega-3 Fatty Acids (FISH OIL PO) Take 1 tablet by mouth daily.    Marland Kitchen oxyCODONE-acetaminophen (PERCOCET/ROXICET) 5-325 MG per tablet Take 1-2 tablets by mouth every 4 (four) hours as needed for severe pain (moderate to severe pain (when tolerating fluids)). 30 tablet 0  . docusate sodium (COLACE) 100 MG capsule Take 1 capsule (100 mg total) by mouth 2 (two) times daily. (Patient not taking: Reported on 01/07/2015) 10 capsule 0  . ibuprofen (ADVIL,MOTRIN) 600 MG tablet Take 1 tablet (600 mg total) by mouth every 6 (six) hours as needed (mild pain). (Patient not taking: Reported on 01/07/2015) 30 tablet 0   No current facility-administered medications for this visit.     ALLERGIES: Doxycycline and Sulfonamide derivatives  Family History  Problem Relation Age of Onset  . Cancer  Maternal Grandmother   . Hypertension Other   . Stroke Other   . Breast cancer Mother     5-6 years ago  . Breast cancer Paternal Grandmother     History   Social History  . Marital Status: Married    Spouse Name: N/A  . Number of Children: 1  . Years of Education: N/A   Occupational History  . customer service    Social History Main Topics  . Smoking status: Current Every Day Smoker -- 0.50 packs/day for 31 years    Types: Cigarettes  . Smokeless tobacco: Not on file  . Alcohol Use: 3.6 oz/week    6 Standard drinks or equivalent per week     Comment: 6 beers per week  . Drug Use: No  . Sexual  Activity:    Partners: Male    Birth Control/ Protection: Surgical     Comment: tubal   Other Topics Concern  . Not on file   Social History Narrative    ROS:  Pertinent items are noted in HPI.  PHYSICAL EXAMINATION:    BP 122/72 mmHg  Pulse 76  Ht 5\' 4"  (1.626 m)  Wt 169 lb 12.8 oz (77.021 kg)  BMI 29.13 kg/m2  LMP 12/05/2014 (Approximate)     General appearance: alert, cooperative and appears stated age Lungs: clear to auscultation bilaterally Heart: regular rate and rhythm Abdomen: Incision - intact with steristrips on, soft, non-tender; no masses,  no organomegaly   ASSESSMENT  Doing well overall.  Constipation and gas pain.   PLAN  Discussed dietary options to help move bowels - fiber, water.  Will try Miralax or a glycerin rectal suppository.  Rx for Percocet.  See orders.  Continue decreased activity and pelvic rest for 6 weeks post op.  Reviewed surgical findings and procedure again.  Questions answered.  Follow up for 6 week check.  An After Visit Summary was printed and given to the patient.  _20___ minutes face to face time of which over 50% was spent in counseling.

## 2015-01-13 NOTE — Discharge Summary (Signed)
Physician Discharge Summary  Patient ID: [name] MRN: [id number] DOB/AGE: 21-Feb-1966 49 y.o.  Admit date: 01/01/2015 Discharge date: 01/03/15  Admission Diagnoses: 1. Menorrhagia.  2. Uterine fibroids.  Discharge Diagnoses:  1. Menorrhagia. 2. Uterine fibroids.  3. Status post total abdominal hysterectomy with bilateral salpingectomy and lysis of adhesions.  Active Problems:   Status post total abdominal hysterectomy   Discharged Condition: good  Hospital Course:  The patient was admitted on 01/01/15 for a total abdominal hysterectomy with bilateral salpingectomy and lysis of adhesions which were performed without complication while under general anesthesia.  The patient's post op course was uneventful.  She had a morphine PCA and Toradol for pain control initially, and this was converted over to Percocet and Motrin on post op day two when the patient began taking po well.  She ambulated independently and wore PAS and Ted hose for DVT prophylaxis while in bed.  Her foley catheter were removed on post op day one, and she voided good volumes .  The patient's vital signs remained stable and she demonstrated no signs of infection during her hospitalization.  The patient's post op day one Hgb was 11.8, and she was tolerating this well. Her incision remained without signs of significant drainage or any infection.  She was found to be in good condition and ready for discharge on post op day two.   Consults: None  Significant Diagnostic Studies: labs:  See hospital course above.  Treatments: surgery:  Total abdominal hysterectomy with bilateral salpingectomy and lysis of adhesions  Discharge Exam: Blood pressure 124/75, pulse 72, temperature 97.4 F (36.3 C), temperature source Oral, resp. rate 18, height 5\' 4"  (1.626 m), weight 165 lb (74.844 kg), SpO2 100 %. General: alert GI: soft, non-tender; bowel sounds normal; no masses, no organomegaly and incision:  Dried blood.     Disposition: 01-Home or Self Care Instructions and precautions are reviewed in verbal and written form.  Surgical findings and procedure have been reviewed including the reasoning for the abdominal approach to the surgery.  Questions have been invited and answered.     Medication List    STOP taking these medications        aspirin 81 MG tablet     ferrous sulfate 325 (65 FE) MG EC tablet     HYDROcodone-acetaminophen 7.5-325 MG per tablet  Commonly known as:  NORCO     levofloxacin 500 MG tablet  Commonly known as:  LEVAQUIN      TAKE these medications        BIOTIN PO  Take 1 tablet by mouth daily.     docusate sodium 100 MG capsule  Commonly known as:  COLACE  Take 1 capsule (100 mg total) by mouth 2 (two) times daily.     FISH OIL PO  Take 1 tablet by mouth daily.     ibuprofen 600 MG tablet  Commonly known as:  ADVIL,MOTRIN  Take 1 tablet (600 mg total) by mouth every 6 (six) hours as needed (mild pain).     levothyroxine 137 MCG tablet  Commonly known as:  SYNTHROID, LEVOTHROID  Take 1 tablet (137 mcg total) by mouth daily before breakfast.           Follow-up Information    Follow up with Arloa Koh, MD In 4 days.   Specialty:  Obstetrics and Gynecology   Contact information:   [address] [place] [address] [phone]  Signed: Arloa Koh 01/13/2015, 6:31 AM

## 2015-01-18 NOTE — Telephone Encounter (Signed)
Patient says employer is requesting something in writing stating that she needs to be out of work longer than March 18th which is her expected return to work date with them.

## 2015-01-18 NOTE — Telephone Encounter (Signed)
Routing to Dr. Quincy Simmonds and to Ivar Drape to review and advise.   Spoke with patient. She states her disability paperwork has her off until 3/18. She will need a letter sent to Duard Larsen with [employer] to advise of new date to return to work.

## 2015-01-20 NOTE — Telephone Encounter (Signed)
I would expect return to work on 02/12/15, which is 6 weeks post op.  Please have a letter prepared with this return to work date.  Thank you!

## 2015-01-21 ENCOUNTER — Encounter: Payer: Self-pay | Admitting: Emergency Medicine

## 2015-01-21 NOTE — Telephone Encounter (Signed)
Letter Printed, signed, and given to [employer].

## 2015-01-25 ENCOUNTER — Telehealth: Payer: Self-pay | Admitting: Obstetrics and Gynecology

## 2015-01-25 NOTE — Telephone Encounter (Signed)
Patient calling requesting assistance with her return to work date of 02/12/15. Her work place is requesting she see the MD prior to her returning to work. Her six week post op appointment is 02/13/15. She is asking if we can change her "disabiliity papers" to a day later to coincide with the appointment or perhaps move the appointment.

## 2015-01-25 NOTE — Telephone Encounter (Signed)
Spoke with patient. Advised Disability is for 6 weeks only. Cannot extend disability.  Advised can move up post op appointment (okay per Lamont Snowball, RN) so that she can have 6 week post op appointment prior to return to work date. She is agreeable to this. Cancelled post op appointment for 3/30 and rescheduled for 02/07/15. Routing to provider for final review. Patient agreeable to disposition. Will close encounter

## 2015-01-30 ENCOUNTER — Ambulatory Visit: Payer: BLUE CROSS/BLUE SHIELD | Admitting: Endocrinology

## 2015-02-05 ENCOUNTER — Telehealth: Payer: Self-pay | Admitting: Obstetrics and Gynecology

## 2015-02-05 NOTE — Telephone Encounter (Signed)
Patient asking to talk with insurance, no details given. Patient just has a question for insurance.

## 2015-02-06 ENCOUNTER — Ambulatory Visit (INDEPENDENT_AMBULATORY_CARE_PROVIDER_SITE_OTHER): Payer: BLUE CROSS/BLUE SHIELD | Admitting: Endocrinology

## 2015-02-06 ENCOUNTER — Encounter: Payer: Self-pay | Admitting: Endocrinology

## 2015-02-06 DIAGNOSIS — E890 Postprocedural hypothyroidism: Secondary | ICD-10-CM | POA: Diagnosis not present

## 2015-02-06 DIAGNOSIS — C73 Malignant neoplasm of thyroid gland: Secondary | ICD-10-CM | POA: Diagnosis not present

## 2015-02-06 NOTE — Patient Instructions (Addendum)
blood tests are being requested for you today.  We'll contact you with results.  Please come back for a follow-up appointment in 1 year.   Given the fact that we haven't heard from the cancer in years, you can shoot for a normal thyroid level now.

## 2015-02-06 NOTE — Progress Notes (Signed)
Subjective:    Patient ID: [name], female    DOB: [birth year], 50 y.o.   MRN: [id number]  HPI Pt returns for f/u of thyroid cancer: She does not notice any nodule at the anterior neck.   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 10/12: adjuvant I-131 rx 158 [facility]. 11/12: post-therapy scan: pos at right neck, nodes vs remnant 2/13: TG=2.3 (ab neg) 6/13 TG=0.7 (ab not done due to lab error) 2/14: TG undetectable (ab neg) Postsurgical hypothyroidism: pt has gained a few lbs.  She takes synthroid as rx'ed. Postsurgical hypocalcemia: she has not required rx, and last Ca++ was normal.  She has leg cramps. Past Medical History  Diagnosis Date  . HYPERLIPIDEMIA     diet controlled, no meds  . DEPRESSION   . ANXIETY   . ALLERGIC RHINITIS   . Thyroid carcinoma   . Hypothyroidism   . Fibroid   . Anemia     Past Surgical History  Procedure Laterality Date  . Tubal ligation    . Total thyroidectomy  2009/2010  . Cesarean section  2002    x 1  . Wisdom tooth extraction    . Bilateral salpingectomy Bilateral 01/01/2015    Procedure: BILATERAL SALPINGECTOMY        ;  Surgeon: Everardo All Amundson de Berton Lan, MD;  Location: [facility];  Service: Gynecology;  Laterality: Bilateral;  . Abdominal hysterectomy N/A 01/01/2015    Procedure:  TOTAL ABDOMINAL HYSTERECTOMY ;  Surgeon: Everardo All Amundson de Berton Lan, MD;  Location: [facility];  Service: Gynecology;  Laterality: N/A;    History   Social History  . Marital Status: Married    Spouse Name: N/A  . Number of Children: 1  . Years of Education: N/A   Occupational History  . customer service    Social History Main Topics  . Smoking status: Current Every Day Smoker -- 0.50 packs/day for 31 years    Types: Cigarettes  . Smokeless tobacco: Not on file  . Alcohol Use: 3.6 oz/week    6 Standard drinks or equivalent per week     Comment: 6 beers per week  . Drug Use: No    . Sexual Activity:    Partners: Male    Birth Control/ Protection: Surgical     Comment: tubal   Other Topics Concern  . Not on file   Social History Narrative    Current Outpatient Prescriptions on File Prior to Visit  Medication Sig Dispense Refill  . BIOTIN PO Take 1 tablet by mouth daily.    Marland Kitchen levothyroxine (SYNTHROID, LEVOTHROID) 137 MCG tablet Take 1 tablet (137 mcg total) by mouth daily before breakfast. 30 tablet 11  . Omega-3 Fatty Acids (FISH OIL PO) Take 1 tablet by mouth daily.     No current facility-administered medications on file prior to visit.    Allergies  Allergen Reactions  . Doxycycline Nausea Only  . Sulfonamide Derivatives Itching    Family History  Problem Relation Age of Onset  . Cancer Maternal Grandmother   . Hypertension Other   . Stroke Other   . Breast cancer Mother     5-6 years ago  . Breast cancer Paternal Grandmother     BP 136/94 mmHg  Pulse 86  Temp(Src) 98.6 F (37 C) (Oral)  Wt 161 lb (73.029 kg)  SpO2 98%  Review of Systems She denies numbness and edema.  Objective:   Physical Exam VITAL SIGNS:  See vs page GENERAL: no distress Neck: a healed scar is present.  i do not appreciate a nodule in the thyroid or elsewhere in the neck.     Lab Results  Component Value Date   TSH 0.80 02/06/2015  vit-D=29    Assessment & Plan:  Postsurgical hypothyroidism: well-replaced Vit-D deficiency: mild: not the cause of hypocalcemia.  No rx is needed. Postsurgical hypocalcemia: due to recheck. Thyroid cancer: no clinical evidence of recurrence.    Patient is advised the following: Patient Instructions  blood tests are being requested for you today.  We'll contact you with results.  Please come back for a follow-up appointment in 1 year.   Given the fact that we haven't heard from the cancer in years, you can shoot for a normal thyroid level now.

## 2015-02-07 ENCOUNTER — Encounter: Payer: Self-pay | Admitting: Obstetrics and Gynecology

## 2015-02-07 ENCOUNTER — Ambulatory Visit (INDEPENDENT_AMBULATORY_CARE_PROVIDER_SITE_OTHER): Payer: BLUE CROSS/BLUE SHIELD | Admitting: Obstetrics and Gynecology

## 2015-02-07 VITALS — BP 128/80 | HR 88 | Ht 64.0 in | Wt 159.8 lb

## 2015-02-07 DIAGNOSIS — Z9889 Other specified postprocedural states: Secondary | ICD-10-CM

## 2015-02-07 DIAGNOSIS — Z90711 Acquired absence of uterus with remaining cervical stump: Secondary | ICD-10-CM

## 2015-02-07 LAB — TSH: TSH: 0.8 u[IU]/mL (ref 0.35–4.50)

## 2015-02-07 LAB — MAGNESIUM: Magnesium: 2 mg/dL (ref 1.5–2.5)

## 2015-02-07 LAB — VITAMIN D 25 HYDROXY (VIT D DEFICIENCY, FRACTURES): VITD: 29.2 ng/mL — AB (ref 30.00–100.00)

## 2015-02-07 LAB — PHOSPHORUS: Phosphorus: 3.5 mg/dL (ref 2.3–4.6)

## 2015-02-07 NOTE — Patient Instructions (Signed)
[name],   On your examination today, I confirmed that you still have some of your cervix remaining. This means that you have had a supracervical hysterectomy and removal of both fallopian tubes.  You will need to have pap smears performed unless you would like your cervix removed on the future.  You may experience periodic bleeding at the time of when your cycle would otherwise have been occurring.  This should never be heavy bleeding.  The cervix may help to support the top of the vagina and your bladder for the future.   Jessica Half, MD

## 2015-02-07 NOTE — Progress Notes (Signed)
Patient ID: [name], female   DOB: [birth year], 49 y.o.   MRN: [id number] GYNECOLOGY  VISIT   HPI: 49 y.o.   Married  Caucasian  female   G1P1001 with Patient's last menstrual period was 12/05/2014 (approximate).   here for 5 weeks post op.   Moved the lawn and the neighbor's - felt sore after this.  No vaginal bleeding. Is back at work.   GYNECOLOGIC HISTORY: Patient's last menstrual period was 12/05/2014 (approximate). Contraception: Tubal  Menopausal hormone therapy: none        OB History    Gravida Para Term Preterm AB TAB SAB Ectopic Multiple Living   1 1 1       1          Patient Active Problem List   Diagnosis Date Noted  . Status post total abdominal hysterectomy 01/01/2015  . Acute upper respiratory infection 12/18/2014  . Elevated blood pressure 12/18/2014  . Smoker 12/18/2014  . Other chest pain 09/10/2014  . Right flank pain 09/05/2014  . Recurrent low back pain 06/12/2014  . Malignant neoplasm of thyroid gland 09/07/2011  . 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 Hypocalcemia 08/31/2011  . Preventative health care 03/17/2011  . LUMBAR RADICULOPATHY, RIGHT 08/21/2008  . PARESTHESIA 07/30/2008  . HYPERLIPIDEMIA 07/18/2008  . ANXIETY 07/18/2008  . DEPRESSION 07/18/2008  . ALLERGIC RHINITIS 07/18/2008    Past Medical History  Diagnosis Date  . HYPERLIPIDEMIA     diet controlled, no meds  . DEPRESSION   . ANXIETY   . ALLERGIC RHINITIS   . Thyroid carcinoma   . Hypothyroidism   . Fibroid   . Anemia     Past Surgical History  Procedure Laterality Date  . Tubal ligation    . Total thyroidectomy  2009/2010  . Cesarean section  2002    x 1  . Wisdom tooth extraction    . Bilateral salpingectomy Bilateral 01/01/2015    Procedure: BILATERAL SALPINGECTOMY        ;  Surgeon: Everardo All Amundson de Berton Lan, MD;  Location: [facility];  Service: Gynecology;  Laterality: Bilateral;  . Abdominal hysterectomy N/A 01/01/2015    Procedure:  TOTAL  ABDOMINAL HYSTERECTOMY ;  Surgeon: Everardo All Amundson de Berton Lan, MD;  Location: [facility];  Service: Gynecology;  Laterality: N/A;    Current Outpatient Prescriptions  Medication Sig Dispense Refill  . BIOTIN PO Take 1 tablet by mouth daily.    Marland Kitchen levothyroxine (SYNTHROID, LEVOTHROID) 137 MCG tablet Take 1 tablet (137 mcg total) by mouth daily before breakfast. 30 tablet 11  . Omega-3 Fatty Acids (FISH OIL PO) Take 1 tablet by mouth daily.     No current facility-administered medications for this visit.     ALLERGIES: Doxycycline and Sulfonamide derivatives  Family History  Problem Relation Age of Onset  . Cancer Maternal Grandmother   . Hypertension Other   . Stroke Other   . Breast cancer Mother     5-6 years ago  . Breast cancer Paternal Grandmother     History   Social History  . Marital Status: Married    Spouse Name: N/A  . Number of Children: 1  . Years of Education: N/A   Occupational History  . customer service    Social History Main Topics  . Smoking status: Current Every Day Smoker -- 0.50 packs/day for 31 years    Types: Cigarettes  . Smokeless tobacco: Not on file  . Alcohol Use: 3.6  oz/week    6 Standard drinks or equivalent per week     Comment: 6 beers per week  . Drug Use: No  . Sexual Activity:    Partners: Male    Birth Control/ Protection: Surgical     Comment: tubal/TAH   Other Topics Concern  . Not on file   Social History Narrative    ROS:  Pertinent items are noted in HPI.  PHYSICAL EXAMINATION:    BP 128/80 mmHg  Pulse 88  Ht 5\' 4"  (1.626 m)  Wt 159 lb 12.8 oz (72.485 kg)  BMI 27.42 kg/m2  LMP 12/05/2014 (Approximate)     General appearance: alert, cooperative and appears stated age   Abdomen: Pfannenstiel incision intact, soft, non-tender; no masses,  no organomegaly    Pelvic: External genitalia:  no lesions              Urethra:  normal appearing urethra with no masses, tenderness or lesions               Bartholins and Skenes: normal                 Vagina: normal appearing vagina with normal color and discharge, no lesions              Cervix: normal appearance                   Bimanual Exam:  Uterus:  absent                                      Adnexa: normal adnexa in size, nontender and no masses                                       ASSESSMENT  Status post supracervical hysterectomy and bilateral salpingectomy.   Portion of the cervix still remains.  PLAN  Discussion of portion of cervix remaining and pathology report reviewed with the patient.  Patient states, "It is ok." Discussion of risks and benefits of still having a cervix.  Potential risk of future cervical cancer and spotting with menses.  Potential benefit of increased pelvic floor support. No plan for cervical removal at this time.  Return to all normal activities in about 1 week.  Return for annual exam in the fall 2016 and prn.    An After Visit Summary was printed and given to the patient.  ___20___ minutes face to face time of which over 50% was spent in counseling.

## 2015-02-08 LAB — THYROGLOBULIN LEVEL: THYROGLOBULIN: 0.1 ng/mL — AB (ref 2.8–40.9)

## 2015-02-08 LAB — THYROGLOBULIN ANTIBODY

## 2015-02-08 LAB — PTH, INTACT AND CALCIUM
CALCIUM: 9 mg/dL (ref 8.4–10.5)
PTH: 21 pg/mL (ref 14–64)

## 2015-02-10 ENCOUNTER — Encounter: Payer: Self-pay | Admitting: Obstetrics and Gynecology

## 2015-02-10 DIAGNOSIS — Z90711 Acquired absence of uterus with remaining cervical stump: Secondary | ICD-10-CM | POA: Insufficient documentation

## 2015-02-13 ENCOUNTER — Ambulatory Visit: Payer: Self-pay | Admitting: Obstetrics and Gynecology

## 2015-02-19 ENCOUNTER — Other Ambulatory Visit: Payer: Self-pay

## 2015-02-19 MED ORDER — LEVOTHYROXINE SODIUM 137 MCG PO TABS: 137.0000 ug | ORAL_TABLET | Freq: Every day | ORAL | Status: DC

## 2015-03-29 ENCOUNTER — Telehealth: Payer: Self-pay | Admitting: *Deleted

## 2015-03-29 ENCOUNTER — Encounter: Payer: Self-pay | Admitting: Obstetrics and Gynecology

## 2015-03-29 NOTE — Telephone Encounter (Deleted)
    -----   Message from [employer] to [employer], MD sent at 03/29/2015 9:58 AM -----    Hey there Dr Quincy Simmonds hope your doing well. I was wondering on that piece of cervix that I wanted to get removed do you think we can have that done before the end of the year? I come to see you in November but wanted to ask in case you could fit me in. Just let me know      Thanks much   [name]    ----- Message from [employer] to Nunzio Cobbs, MD sent at 03/29/2015 9:58 AM -----     Hey there Dr Quincy Simmonds hope your doing well. I was wondering on that piece of cervix that I wanted to get removed do you think we can have that done before the end of the year? I come to see you in November but wanted to ask in case you could fit me in. Just let me know        Thanks much    [name]

## 2015-03-29 NOTE — Telephone Encounter (Signed)
See My Chart message from patient. Telephone call created. Patient requesting to schedule surgery (to remove piece of cervix) before the end of the year. Call to patient and advised that this can certainly be scheduled in the time requested. Will need to see Dr Quincy Simmonds approximately 4 weeks prior to surgery, unrelated to the annual exam which is scheduled for 10-03-15. Advised will confirm with Dr Quincy Simmonds for scheduling instructions and call her back with additional information.

## 2015-04-01 NOTE — Telephone Encounter (Signed)
I would like to see the patient for examination so I could plan for cervix removal.   Thank you,  Josefa Half

## 2015-04-02 NOTE — Telephone Encounter (Signed)
Call to patient. Advised of Dr Elza Rafter recommendation for office visit. Appointment scheduled for 04-11-15 at 330.  Routing to provider for final review. Patient agreeable to disposition. Will close encounter.

## 2015-04-11 ENCOUNTER — Encounter: Payer: Self-pay | Admitting: Obstetrics and Gynecology

## 2015-04-11 ENCOUNTER — Ambulatory Visit (INDEPENDENT_AMBULATORY_CARE_PROVIDER_SITE_OTHER): Payer: BLUE CROSS/BLUE SHIELD | Admitting: Obstetrics and Gynecology

## 2015-04-11 VITALS — BP 110/82 | HR 70 | Ht 64.0 in | Wt 165.8 lb

## 2015-04-11 DIAGNOSIS — Z90711 Acquired absence of uterus with remaining cervical stump: Secondary | ICD-10-CM | POA: Diagnosis not present

## 2015-04-11 DIAGNOSIS — Z8742 Personal history of other diseases of the female genital tract: Secondary | ICD-10-CM | POA: Diagnosis not present

## 2015-04-11 NOTE — Progress Notes (Signed)
Patient ID: [name], female   DOB: 12-07-1965, 49 y.o.   MRN: [id number] GYNECOLOGY  VISIT   HPI: 49 y.o.   Married  Caucasian  female   G1P1001 with Patient's last menstrual period was 12/05/2014 (approximate).   here to discuss surgery.   Wants cervix removed.  Had abdominal hysterectomy and a portion of the cervix remained inadvertently.  Patient truly had a supracervical hysterectomy.   Denies any bleeding or spotting.  Uncertain if had dried blood.  Had some foul odor.  This resolved.   GYNECOLOGIC HISTORY: Patient's last menstrual period was 12/05/2014 (approximate).--Supracervical Hyst/Bil Salpingectomy 01-01-15.  Contraception:Tubal/TAH/Bil. Salpingectomy Menopausal hormone therapy: n/a Last mammogram: 11-29-14 wnl/dense:[facility] Last pap smear: 10-01-14 Ascus:neg HR HPV.   Pap 09/28/13 - ASCUS and positive HR HPV.  Colpo 10/25/13 - ECC - benign. No cervical lesions were seen.         OB History    Gravida Para Term Preterm AB TAB SAB Ectopic Multiple Living   1 1 1       1          Patient Active Problem List   Diagnosis Date Noted  . Status post abdominal supracervical subtotal hysterectomy 02/10/2015  . Status post total abdominal hysterectomy 01/01/2015  . Acute upper respiratory infection 12/18/2014  . Elevated blood pressure 12/18/2014  . Smoker 12/18/2014  . Other chest pain 09/10/2014  . Right flank pain 09/05/2014  . Recurrent low back pain 06/12/2014  . Malignant neoplasm of thyroid gland 09/07/2011  . 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 Hypocalcemia 08/31/2011  . Preventative health care 03/17/2011  . LUMBAR RADICULOPATHY, RIGHT 08/21/2008  . PARESTHESIA 07/30/2008  . HYPERLIPIDEMIA 07/18/2008  . ANXIETY 07/18/2008  . DEPRESSION 07/18/2008  . ALLERGIC RHINITIS 07/18/2008    Past Medical History  Diagnosis Date  . HYPERLIPIDEMIA     diet controlled, no meds  . DEPRESSION   . ANXIETY   . ALLERGIC RHINITIS   . Thyroid  carcinoma   . Hypothyroidism   . Fibroid   . Anemia     Past Surgical History  Procedure Laterality Date  . Tubal ligation    . Total thyroidectomy  2009/2010  . Cesarean section  2002    x 1  . Wisdom tooth extraction    . Bilateral salpingectomy Bilateral 01/01/2015    Procedure: BILATERAL SALPINGECTOMY        ;  Surgeon: Everardo All Amundson de Berton Lan, MD;  Location: [facility];  Service: Gynecology;  Laterality: Bilateral;  . Abdominal hysterectomy N/A 01/01/2015    Procedure:  SUPRACERVICAL HYSTERECTOMY WITH BILATERAL SALPINGECTOMT  Surgeon: Jamey Reas de Berton Lan, MD;  Location: [facility];  Service: Gynecology;  Laterality: N/A;    Current Outpatient Prescriptions  Medication Sig Dispense Refill  . BIOTIN PO Take 1 tablet by mouth daily.    Marland Kitchen levothyroxine (SYNTHROID, LEVOTHROID) 137 MCG tablet Take 1 tablet (137 mcg total) by mouth daily before breakfast. 30 tablet 6  . Omega-3 Fatty Acids (FISH OIL PO) Take 1 tablet by mouth daily.     No current facility-administered medications for this visit.     ALLERGIES: Doxycycline and Sulfonamide derivatives  Family History  Problem Relation Age of Onset  . Cancer Maternal Grandmother   . Hypertension Other   . Stroke Other   . Breast cancer Mother     5-6 years ago  . Breast cancer Paternal Grandmother     History  Social History  . Marital Status: Married    Spouse Name: N/A  . Number of Children: 1  . Years of Education: N/A   Occupational History  . customer service    Social History Main Topics  . Smoking status: Current Every Day Smoker -- 0.50 packs/day for 31 years    Types: Cigarettes  . Smokeless tobacco: Not on file  . Alcohol Use: 3.6 oz/week    6 Standard drinks or equivalent per week     Comment: 6 beers per week  . Drug Use: No  . Sexual Activity:    Partners: Male    Birth Control/ Protection: Surgical     Comment: tubal/TAH   Other Topics Concern  . Not on file   Social  History Narrative    ROS:  Pertinent items are noted in HPI.  PHYSICAL EXAMINATION:    BP 110/82 mmHg  Pulse 70  Ht 5\' 4"  (1.626 m)  Wt 165 lb 12.8 oz (75.206 kg)  BMI 28.45 kg/m2  LMP 12/05/2014 (Approximate)    General appearance: alert, cooperative and appears stated age Abdomen: Pfannenstiel incision intact, soft, non-tender; bowel sounds normal; no masses,  no organomegaly  Pelvic: External genitalia:  no lesions              Urethra:  normal appearing urethra with no masses, tenderness or lesions              Bartholins and Skenes: normal                 Vagina: normal appearing vagina with normal color and discharge, no lesions              Cervix: no lesions.  It feels like this is a small piece of the cervix remaining and that it is easily identifiable and not flush with the vaginal cuff.              Pap taken: Yes.   Bimanual Exam:  Uterus:  uterus absent              Adnexa: normal adnexa and no mass, fullness, tenderness              Chaperone was present for exam.  ASSESSMENT  Status post supracervical hysterectomy with bilateral salpingectomy.  History of abnormal pap and positive HR HPV in 2014 with normal ECC at colposcopy.  Follow up pap in 2015 negative pap and negative HR HPV.    PLAN  Pap and HR HPV done today.  Counseled regarding cervical dysplasia and cancer, risks and benefits of vaginal cervical removal.   I believe that I could remove the remainder of the cervix vaginally if necessary or desired.  Could be done with spinal or general anesthesia.   An After Visit Summary was printed and given to the patient.  _15_____ minutes face to face time of which over 50% was spent in counseling.

## 2015-04-11 NOTE — Patient Instructions (Signed)
We will contact you when you pap is back!  I will be happy to remove your cervix if you would like to do this.

## 2015-04-16 LAB — IPS PAP TEST WITH HPV

## 2015-04-22 ENCOUNTER — Encounter: Payer: Self-pay | Admitting: Internal Medicine

## 2015-04-22 DIAGNOSIS — Z8371 Family history of colonic polyps: Secondary | ICD-10-CM

## 2015-04-25 NOTE — Addendum Note (Signed)
Addended by: Biagio Borg on: 04/25/2015 05:21 PM   Modules accepted: Orders

## 2015-06-18 ENCOUNTER — Encounter: Payer: Self-pay | Admitting: Gastroenterology

## 2015-08-12 ENCOUNTER — Ambulatory Visit: Payer: BLUE CROSS/BLUE SHIELD | Admitting: Gastroenterology

## 2015-08-13 ENCOUNTER — Ambulatory Visit (INDEPENDENT_AMBULATORY_CARE_PROVIDER_SITE_OTHER)
Admission: RE | Admit: 2015-08-13 | Discharge: 2015-08-13 | Disposition: A | Discharge status: Home / Self Care | Payer: BLUE CROSS/BLUE SHIELD | Source: Ambulatory Visit | Attending: Internal Medicine | Admitting: Internal Medicine

## 2015-08-13 ENCOUNTER — Other Ambulatory Visit (INDEPENDENT_AMBULATORY_CARE_PROVIDER_SITE_OTHER): Payer: BLUE CROSS/BLUE SHIELD

## 2015-08-13 ENCOUNTER — Ambulatory Visit (INDEPENDENT_AMBULATORY_CARE_PROVIDER_SITE_OTHER): Payer: BLUE CROSS/BLUE SHIELD | Admitting: Internal Medicine

## 2015-08-13 ENCOUNTER — Encounter: Payer: Self-pay | Admitting: Internal Medicine

## 2015-08-13 VITALS — BP 130/82 | HR 79 | Temp 98.4°F | Ht 64.0 in | Wt 163.0 lb

## 2015-08-13 DIAGNOSIS — Z23 Encounter for immunization: Secondary | ICD-10-CM

## 2015-08-13 DIAGNOSIS — R05 Cough: Secondary | ICD-10-CM

## 2015-08-13 DIAGNOSIS — C73 Malignant neoplasm of thyroid gland: Secondary | ICD-10-CM

## 2015-08-13 DIAGNOSIS — F17200 Nicotine dependence, unspecified, uncomplicated: Secondary | ICD-10-CM

## 2015-08-13 DIAGNOSIS — E890 Postprocedural hypothyroidism: Secondary | ICD-10-CM

## 2015-08-13 DIAGNOSIS — R059 Cough, unspecified: Secondary | ICD-10-CM | POA: Insufficient documentation

## 2015-08-13 DIAGNOSIS — Z0000 Encounter for general adult medical examination without abnormal findings: Secondary | ICD-10-CM

## 2015-08-13 DIAGNOSIS — Z720 Tobacco use: Secondary | ICD-10-CM

## 2015-08-13 LAB — HEPATIC FUNCTION PANEL
ALT: 15 U/L (ref 0–35)
AST: 15 U/L (ref 0–37)
Albumin: 4 g/dL (ref 3.5–5.2)
Alkaline Phosphatase: 86 U/L (ref 39–117)
BILIRUBIN DIRECT: 0.1 mg/dL (ref 0.0–0.3)
TOTAL PROTEIN: 7.4 g/dL (ref 6.0–8.3)
Total Bilirubin: 0.3 mg/dL (ref 0.2–1.2)

## 2015-08-13 LAB — CBC WITH DIFFERENTIAL/PLATELET
Basophils Absolute: 0 10*3/uL (ref 0.0–0.1)
Basophils Relative: 0.4 % (ref 0.0–3.0)
EOS ABS: 0.4 10*3/uL (ref 0.0–0.7)
Eosinophils Relative: 3.8 % (ref 0.0–5.0)
HEMATOCRIT: 44 % (ref 36.0–46.0)
Hemoglobin: 15 g/dL (ref 12.0–15.0)
Lymphocytes Relative: 31 % (ref 12.0–46.0)
Lymphs Abs: 3.4 10*3/uL (ref 0.7–4.0)
MCHC: 34.1 g/dL (ref 30.0–36.0)
MCV: 93.8 fl (ref 78.0–100.0)
MONOS PCT: 9 % (ref 3.0–12.0)
Monocytes Absolute: 1 10*3/uL (ref 0.1–1.0)
NEUTROS ABS: 6.1 10*3/uL (ref 1.4–7.7)
Neutrophils Relative %: 55.8 % (ref 43.0–77.0)
PLATELETS: 331 10*3/uL (ref 150.0–400.0)
RBC: 4.7 Mil/uL (ref 3.87–5.11)
RDW: 12.4 % (ref 11.5–15.5)
WBC: 10.9 10*3/uL — ABNORMAL HIGH (ref 4.0–10.5)

## 2015-08-13 LAB — URINALYSIS, ROUTINE W REFLEX MICROSCOPIC
Bilirubin Urine: NEGATIVE
Hgb urine dipstick: NEGATIVE
Ketones, ur: NEGATIVE
Leukocytes, UA: NEGATIVE
Nitrite: NEGATIVE
SPECIFIC GRAVITY, URINE: 1.025 (ref 1.000–1.030)
Total Protein, Urine: NEGATIVE
Urine Glucose: NEGATIVE
Urobilinogen, UA: 0.2 (ref 0.0–1.0)
pH: 6 (ref 5.0–8.0)

## 2015-08-13 LAB — BASIC METABOLIC PANEL
BUN: 10 mg/dL (ref 6–23)
CHLORIDE: 101 meq/L (ref 96–112)
CO2: 28 meq/L (ref 19–32)
Calcium: 9.3 mg/dL (ref 8.4–10.5)
Creatinine, Ser: 0.59 mg/dL (ref 0.40–1.20)
GFR: 115.68 mL/min (ref >60.00)
Glucose, Bld: 93 mg/dL (ref 70–99)
Potassium: 3.9 mEq/L (ref 3.5–5.1)
SODIUM: 136 meq/L (ref 135–145)

## 2015-08-13 LAB — LIPID PANEL
Cholesterol: 204 mg/dL — ABNORMAL HIGH (ref 0–200)
HDL: 48.5 mg/dL (ref >39.00)
LDL Cholesterol: 140 mg/dL — ABNORMAL HIGH (ref 0–99)
NonHDL: 155.36
Total CHOL/HDL Ratio: 4
Triglycerides: 79 mg/dL (ref 0.0–149.0)
VLDL: 15.8 mg/dL (ref 0.0–40.0)

## 2015-08-13 LAB — TSH: TSH: 1.47 u[IU]/mL (ref 0.35–4.50)

## 2015-08-13 NOTE — Patient Instructions (Signed)
You had the flu shot today  Please quit smoking  Please continue all other medications as before, and refills have been done if requested.  Please have the pharmacy call with any other refills you may need.  Please continue your efforts at being more active, low cholesterol diet, and weight control.  You are otherwise up to date with prevention measures today.  Please keep your appointments with your specialists as you may have planned  Please go to the XRAY Department in the Basement (go straight as you get off the elevator) for the x-ray testing  Please go to the LAB in the Basement (turn left off the elevator) for the tests to be done today  You will be contacted by phone if any changes need to be made immediately.  Otherwise, you will receive a letter about your results with an explanation, but please check with MyChart first.  Please remember to sign up for MyChart if you have not done so, as this will be important to you in the future with finding out test results, communicating by private email, and scheduling acute appointments online when needed.  Please return in 1 year for your yearly visit, or sooner if needed

## 2015-08-13 NOTE — Assessment & Plan Note (Signed)

## 2015-08-13 NOTE — Assessment & Plan Note (Signed)
To also f/u with Dr Loanne Drilling

## 2015-08-13 NOTE — Progress Notes (Signed)
Subjective:    Patient ID: [name], female    DOB: 20-Jun-1966, 49 y.o.   MRN: [id number]  HPI  Here for wellness and f/u;  Overall doing ok;  Pt denies Chest pain, worsening SOB, DOE, wheezing, orthopnea, PND, worsening LE edema, palpitations, dizziness or syncope.  Pt denies neurological change such as new headache, facial or extremity weakness.  Pt denies polydipsia, polyuria, or low sugar symptoms. Pt states overall good compliance with treatment and medications, good tolerability, and has been trying to follow appropriate diet.  Pt denies worsening depressive symptoms, suicidal ideation or panic. No fever, night sweats, wt loss, loss of appetite, or other constitutional symptoms.  Pt states good ability with ADL's, has low fall risk, home safety reviewed and adequate, no other significant changes in hearing or vision, and only occasionally active with exercise. No new complaints except for recent cough nonprod now improved, but with post right flank pain with inspiration x 2-3 days.  No fever, sob. Considering quitting smoking but not ready yet Wt Readings from Last 3 Encounters:  08/13/15 163 lb (73.936 kg)  04/11/15 165 lb 12.8 oz (75.206 kg)  02/07/15 159 lb 12.8 oz (72.485 kg)   Past Medical History  Diagnosis Date  . HYPERLIPIDEMIA     diet controlled, no meds  . DEPRESSION   . ANXIETY   . ALLERGIC RHINITIS   . Thyroid carcinoma   . Hypothyroidism   . Fibroid   . Anemia    Past Surgical History  Procedure Laterality Date  . Tubal ligation    . Total thyroidectomy  2009/2010  . Cesarean section  2002    x 1  . Wisdom tooth extraction    . Bilateral salpingectomy Bilateral 01/01/2015    Procedure: BILATERAL SALPINGECTOMY        ;  Surgeon: Everardo All Amundson de Berton Lan, MD;  Location: [facility];  Service: Gynecology;  Laterality: Bilateral;  . Abdominal hysterectomy N/A 01/01/2015    Procedure:  SUPRACERVICAL HYSTERECTOMY WITH BILATERAL SALPINGECTOMT  Surgeon:  Jamey Reas de Berton Lan, MD;  Location: [facility];  Service: Gynecology;  Laterality: N/A;    reports that she has been smoking Cigarettes.  She has a 15.5 pack-year smoking history. She does not have any smokeless tobacco history on file. She reports that she drinks about 3.6 oz of alcohol per week. She reports that she does not use illicit drugs. family history includes Breast cancer in her mother and paternal grandmother; Cancer in her maternal grandmother; Hypertension in her other; Stroke in her other. Allergies  Allergen Reactions  . Doxycycline Nausea Only  . Sulfonamide Derivatives Itching   Current Outpatient Prescriptions on File Prior to Visit  Medication Sig Dispense Refill  . BIOTIN PO Take 1 tablet by mouth daily.    Marland Kitchen levothyroxine (SYNTHROID, LEVOTHROID) 137 MCG tablet Take 1 tablet (137 mcg total) by mouth daily before breakfast. 30 tablet 6  . Omega-3 Fatty Acids (FISH OIL PO) Take 1 tablet by mouth daily.     No current facility-administered medications on file prior to visit.   Review of Systems Constitutional: Negative for increased diaphoresis, other activity, appetite or siginficant weight change other than noted HENT: Negative for worsening hearing loss, ear pain, facial swelling, mouth sores and neck stiffness.   Eyes: Negative for other worsening pain, redness or visual disturbance.  Respiratory: Negative for shortness of breath and wheezing  Cardiovascular: Negative for chest pain and palpitations.  Gastrointestinal:  Negative for diarrhea, blood in stool, abdominal distention or other pain Genitourinary: Negative for hematuria, flank pain or change in urine volume.  Musculoskeletal: Negative for myalgias or other joint complaints.  Skin: Negative for color change and wound or drainage.  Neurological: Negative for syncope and numbness. other than noted Hematological: Negative for adenopathy. or other swelling Psychiatric/Behavioral: Negative for  hallucinations, SI, self-injury, decreased concentration or other worsening agitation.       Objective:   Physical Exam BP 130/82 mmHg  Pulse 79  Temp(Src) 98.4 F (36.9 C) (Oral)  Ht 5\' 4"  (1.626 m)  Wt 163 lb (73.936 kg)  BMI 27.97 kg/m2  SpO2 98%  LMP 12/05/2014 (Approximate) VS noted,  Constitutional: Pt is oriented to person, place, and time. Appears well-developed and well-nourished, in no significant distress Head: Normocephalic and atraumatic.  Right Ear: External ear normal.  Left Ear: External ear normal.  Nose: Nose normal.  Mouth/Throat: Oropharynx is clear and moist.  Eyes: Conjunctivae and EOM are normal. Pupils are equal, round, and reactive to light.  Neck: Normal range of motion. Neck supple. No JVD present. No tracheal deviation present or significant neck LA or mass Cardiovascular: Normal rate, regular rhythm, normal heart sounds and intact distal pulses.   Pulmonary/Chest: Effort normal and breath sounds some decresaed as bases, isolated wheeze right mid lung field, o/w without rales or wheezing  Abdominal: Soft. Bowel sounds are normal. NT. No HSM  Musculoskeletal: Normal range of motion. Exhibits no edema.  Lymphadenopathy:  Has no cervical adenopathy.  Neurological: Pt is alert and oriented to person, place, and time. Pt has normal reflexes. No cranial nerve deficit. Motor grossly intact Skin: Skin is warm and dry. No rash noted.  Psychiatric:  Has normal mood and affect. Behavior is normal.     Assessment & Plan:

## 2015-08-13 NOTE — Progress Notes (Signed)
Pre visit review using our clinic review tool, if applicable. No additional management support is needed unless otherwise documented below in the visit note. 

## 2015-08-13 NOTE — Assessment & Plan Note (Signed)
Urged to quit 

## 2015-08-13 NOTE — Assessment & Plan Note (Signed)
Also for free t4, o/w asympt

## 2015-08-13 NOTE — Assessment & Plan Note (Signed)
With pain as above, ? msk due recent prob viral bronchitis now improving, but cant r/o atypical pna - for cxr

## 2015-08-14 LAB — T4, FREE: Free T4: 1.14 ng/dL (ref 0.60–1.60)

## 2015-08-15 ENCOUNTER — Encounter: Payer: Self-pay | Admitting: Internal Medicine

## 2015-09-18 ENCOUNTER — Encounter: Payer: Self-pay | Admitting: Internal Medicine

## 2015-09-18 MED ORDER — AZITHROMYCIN 250 MG PO TABS: ORAL_TABLET | ORAL | Status: DC

## 2015-09-20 MED ORDER — HYDROCODONE-ACETAMINOPHEN 7.5-500 MG PO TABS: 1.0000 | ORAL_TABLET | Freq: Every day | ORAL | Status: DC | PRN

## 2015-09-20 NOTE — Addendum Note (Signed)
Addended by: Biagio Borg on: 09/20/2015 11:01 AM   Modules accepted: Orders

## 2015-09-20 NOTE — Telephone Encounter (Signed)
Done hardcopy to Dahlia  

## 2015-09-25 ENCOUNTER — Encounter: Payer: Self-pay | Admitting: Internal Medicine

## 2015-09-25 MED ORDER — HYDROCODONE-ACETAMINOPHEN 7.5-325 MG PO TABS: 1.0000 | ORAL_TABLET | Freq: Four times a day (QID) | ORAL | Status: DC | PRN

## 2015-09-25 NOTE — Telephone Encounter (Signed)
Done hardcopy to [employer] desk

## 2015-09-26 NOTE — Telephone Encounter (Signed)
Patient advised that rx for hydrocodone is ready for pick up at front desk

## 2015-10-01 ENCOUNTER — Telehealth: Payer: Self-pay | Admitting: Endocrinology

## 2015-10-01 MED ORDER — LEVOTHYROXINE SODIUM 137 MCG PO TABS: 137.0000 ug | ORAL_TABLET | Freq: Every day | ORAL | Status: DC

## 2015-10-01 NOTE — Telephone Encounter (Signed)
Rx submitted per pt's request.  

## 2015-10-01 NOTE — Telephone Encounter (Signed)
Patient called and would like a refill on her Rx  Rx: Levothyroxine   Pharmacy: [facility]    Thank you

## 2015-10-03 ENCOUNTER — Encounter: Payer: Self-pay | Admitting: Obstetrics and Gynecology

## 2015-10-03 ENCOUNTER — Ambulatory Visit (INDEPENDENT_AMBULATORY_CARE_PROVIDER_SITE_OTHER): Payer: BLUE CROSS/BLUE SHIELD | Admitting: Obstetrics and Gynecology

## 2015-10-03 VITALS — BP 136/88 | HR 78 | Resp 18 | Ht 63.5 in | Wt 163.0 lb

## 2015-10-03 DIAGNOSIS — Z01419 Encounter for gynecological examination (general) (routine) without abnormal findings: Secondary | ICD-10-CM

## 2015-10-03 NOTE — Progress Notes (Signed)
49 y.o. G49P1001 Married Caucasian female here for annual exam.   Had supracervical hysterectomy 01/01/15. Piece of cervix was inadvertently left at time of hysterectomy.  Pap 04/11/15 - ASCUS and negative HR HPV.   Trying to quit smoking.   PCP:  Dr. Cathlean Cower Endocrinologist:  Dr. Loanne Drilling    Patient's last menstrual period was 12/05/2014 (approximate).          Sexually active: Yes.    The current method of family planning is status post hysterectomy.    Exercising: No.  The patient does not participate in regular exercise at present. Smoker:  Yes  Health Maintenance: Pap:  04/11/15 ASCUS. HR HPV:Neg History of abnormal Pap:  Yes, pap 09/28/13 - ASCUS and positive HR HPV, 2014 Colpo ECC Benign.  Follow up paps ASCUS and negative HR HPV. MMG:  11/29/14 BIRADS1:neg Colonoscopy:  Never BMD:   Never TDaP:  2014  Screening Labs:  Hb today: PCP, Urine today: PCP   reports that she has been smoking Cigarettes.  She has a 7.75 pack-year smoking history. She has quit using smokeless tobacco. She reports that she drinks about 3.6 oz of alcohol per week. She reports that she does not use illicit drugs.  Past Medical History  Diagnosis Date  . HYPERLIPIDEMIA     diet controlled, no meds  . DEPRESSION   . ANXIETY   . ALLERGIC RHINITIS   . Thyroid carcinoma ([place])   . Hypothyroidism   . Fibroid   . Anemia     Past Surgical History  Procedure Laterality Date  . Tubal ligation    . Total thyroidectomy  2009/2010  . Cesarean section  2002    x 1  . Wisdom tooth extraction    . Bilateral salpingectomy Bilateral 01/01/2015    Procedure: BILATERAL SALPINGECTOMY        ;  Surgeon: Everardo All Amundson de Berton Lan, MD;  Location: [facility];  Service: Gynecology;  Laterality: Bilateral;  . Abdominal hysterectomy N/A 01/01/2015    Procedure:  SUPRACERVICAL HYSTERECTOMY WITH BILATERAL SALPINGECTOMT  Surgeon: Jamey Reas de Berton Lan, MD;  Location: [facility];  Service: Gynecology;   Laterality: N/A;    Current Outpatient Prescriptions  Medication Sig Dispense Refill  . BIOTIN PO Take 1 tablet by mouth daily.    Marland Kitchen HYDROcodone-acetaminophen (NORCO) 7.5-325 MG tablet Take 1 tablet by mouth every 6 (six) hours as needed for moderate pain. 30 tablet 0  . levothyroxine (SYNTHROID, LEVOTHROID) 137 MCG tablet Take 1 tablet (137 mcg total) by mouth daily before breakfast. 30 tablet 6  . Omega-3 Fatty Acids (FISH OIL PO) Take 1 tablet by mouth daily.    Marland Kitchen azithromycin (ZITHROMAX Z-PAK) 250 MG tablet Use as directed (Patient not taking: Reported on 10/03/2015) 6 tablet 1   No current facility-administered medications for this visit.    Family History  Problem Relation Age of Onset  . Cancer Maternal Grandmother   . Hypertension Other   . Stroke Other   . Breast cancer Mother     5-6 years ago  . Breast cancer Paternal Grandmother   . Diabetes Father     ROS:  Pertinent items are noted in HPI.  Otherwise, a comprehensive ROS was negative.  Exam:   BP 136/88 mmHg  Pulse 78  Resp 18  Ht 5' 3.5" (1.613 m)  Wt 163 lb (73.936 kg)  BMI 28.42 kg/m2  LMP 12/05/2014 (Approximate)    General appearance: alert, cooperative and  appears stated age Head: Normocephalic, without obvious abnormality, atraumatic Neck: no adenopathy, supple, symmetrical, trachea midline and thyroid normal to inspection and palpation Lungs: clear to auscultation bilaterally Breasts: normal appearance, no masses or tenderness, Inspection negative, No nipple retraction or dimpling, No nipple discharge or bleeding, No axillary or supraclavicular adenopathy Heart: regular rate and rhythm Abdomen: Pfannenstiel incision intact, soft, non-tender; bowel sounds normal; no masses,  no organomegaly Extremities: extremities normal, atraumatic, no cyanosis or edema Skin: Skin color, texture, turgor normal. No rashes or lesions Lymph nodes: Cervical, supraclavicular, and axillary nodes normal. No abnormal  inguinal nodes palpated Neurologic: Grossly normal  Pelvic: External genitalia:  no lesions              Urethra:  normal appearing urethra with no masses, tenderness or lesions              Bartholins and Skenes: normal                 Vagina: normal appearing vagina with normal color and discharge, no lesions              Cervix: no lesions              Pap taken: Yes.   Bimanual Exam:  Uterus:  uterus absent              Adnexa: normal adnexa and no mass, fullness, tenderness              Rectovaginal: Yes.  .  Confirms.              Anus:  normal sphincter tone, no lesions  Assessment:   Well woman visit with normal exam. Status post abdominal supracervical hysterectomy and bilateral salpingectomy.  Hx ASCUS pap and negative HR HPV.  Smoker.   Plan: Yearly mammogram recommended after age 53.  Recommended self breast exam.  Pap and HR HPV as above. Discussed Calcium, Vitamin D, regular exercise program including cardiovascular and weight bearing exercise. Labs performed.  No..   See orders. Refills given on medications.  No..  See orders. Encouraged cessation.  Follow up annually and prn.      After visit summary provided.

## 2015-10-03 NOTE — Patient Instructions (Signed)

## 2015-10-07 LAB — IPS PAP TEST WITH HPV

## 2015-10-25 ENCOUNTER — Encounter: Payer: Self-pay | Admitting: Internal Medicine

## 2015-10-29 ENCOUNTER — Other Ambulatory Visit: Payer: Self-pay

## 2015-10-29 DIAGNOSIS — Z1231 Encounter for screening mammogram for malignant neoplasm of breast: Secondary | ICD-10-CM

## 2015-10-30 ENCOUNTER — Ambulatory Visit: Payer: BLUE CROSS/BLUE SHIELD | Admitting: Endocrinology

## 2015-11-20 ENCOUNTER — Ambulatory Visit: Payer: BLUE CROSS/BLUE SHIELD | Admitting: Endocrinology

## 2015-12-03 ENCOUNTER — Ambulatory Visit: Payer: BLUE CROSS/BLUE SHIELD

## 2015-12-11 ENCOUNTER — Ambulatory Visit (INDEPENDENT_AMBULATORY_CARE_PROVIDER_SITE_OTHER): Payer: Managed Care, Other (non HMO) | Admitting: Endocrinology

## 2015-12-11 ENCOUNTER — Encounter: Payer: Self-pay | Admitting: Endocrinology

## 2015-12-11 VITALS — BP 122/84 | HR 90 | Temp 98.2°F | Ht 63.5 in | Wt 165.0 lb

## 2015-12-11 DIAGNOSIS — E890 Postprocedural hypothyroidism: Secondary | ICD-10-CM | POA: Diagnosis not present

## 2015-12-11 DIAGNOSIS — C73 Malignant neoplasm of thyroid gland: Secondary | ICD-10-CM | POA: Diagnosis not present

## 2015-12-11 LAB — TSH: TSH: 1.89 u[IU]/mL (ref 0.35–4.50)

## 2015-12-11 NOTE — Patient Instructions (Signed)
blood tests are being requested for you today.  We'll contact you with results.  Please come back for a follow-up appointment in 1 year.   Given the fact that we haven't heard from the cancer in years, you can shoot for a normal thyroid level now.    

## 2015-12-11 NOTE — Progress Notes (Signed)
Subjective:    Patient ID: [name], female    DOB: [birth year], 50 y.o.   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  10/12: adjuvant I-131 rx 158 [facility]. 11/12: post-therapy scan: pos at right neck, nodes vs remnant 2/13: TG=2.3 (ab neg) 6/13 TG=0.7 (ab not done due to lab error) 2/14: TG undetectable (ab neg).  3/15: TG=undetectable (ab neg). 3/16: TG=0.1 (ab neg). Postsurgical hypothyroidism: pt has gained a few lbs.  She takes synthroid as rx'ed.  Past Medical History  Diagnosis Date  . HYPERLIPIDEMIA     diet controlled, no meds  . DEPRESSION   . ANXIETY   . ALLERGIC RHINITIS   . Thyroid carcinoma ([place])   . Hypothyroidism   . Fibroid   . Anemia     Past Surgical History  Procedure Laterality Date  . Tubal ligation    . Total thyroidectomy  2009/2010  . Cesarean section  2002    x 1  . Wisdom tooth extraction    . Bilateral salpingectomy Bilateral 01/01/2015    Procedure: BILATERAL SALPINGECTOMY        ;  Surgeon: Everardo All Amundson de Berton Lan, MD;  Location: [facility];  Service: Gynecology;  Laterality: Bilateral;  . Abdominal hysterectomy N/A 01/01/2015    Procedure:  SUPRACERVICAL HYSTERECTOMY WITH BILATERAL SALPINGECTOMT  Surgeon: Jamey Reas de Berton Lan, MD;  Location: [facility];  Service: Gynecology;  Laterality: N/A;    Social History   Social History  . Marital Status: Married    Spouse Name: N/A  . Number of Children: 1  . Years of Education: N/A   Occupational History  . customer service    Social History Main Topics  . Smoking status: Current Every Day Smoker -- 0.25 packs/day for 31 years    Types: Cigarettes  . Smokeless tobacco: Never Used  . Alcohol Use: 3.6 oz/week    6 Standard drinks or equivalent per week     Comment: 6 beers per week    . Drug Use: No  . Sexual Activity:    Partners: Male    Birth Control/ Protection: Surgical     Comment: tubal/TAH   Other Topics Concern  . Not on file   Social History Narrative    Current Outpatient Prescriptions on File Prior to Visit  Medication Sig Dispense Refill  . BIOTIN PO Take 1 tablet by mouth daily. Reported on 12/11/2015    . levothyroxine (SYNTHROID, LEVOTHROID) 137 MCG tablet Take 1 tablet (137 mcg total) by mouth daily before breakfast. 30 tablet 6  . Omega-3 Fatty Acids (FISH OIL PO) Take 1 tablet by mouth daily.     No current facility-administered medications on file prior to visit.    Allergies  Allergen Reactions  . Doxycycline Nausea Only  . Sulfonamide Derivatives Itching    Family History  Problem Relation Age of Onset  . Cancer Maternal Grandmother   . Hypertension Other   . Stroke Other   . Breast cancer Mother     5-6 years ago  . Breast cancer Paternal Grandmother   . Diabetes Father     BP 122/84 mmHg  Pulse 90  Temp(Src) 98.2 F (36.8 C) (Oral)  Ht 5' 3.5" (1.613 m)  Wt 165 lb (74.844 kg)  BMI 28.77 kg/m2  SpO2 96%  LMP 12/05/2014 (Approximate)   Review of Systems Denies numbness    Objective:   Physical Exam VITAL SIGNS:  See vs page GENERAL: no distress Neck: a healed scar is present.  i do not appreciate a nodule in the thyroid or elsewhere in the neck.    Lab Results  Component Value Date   TSH 1.89 12/11/2015       Assessment & Plan:  Postsurgical hypothyroidism: well-replaced. Differentiated thyroid cancer: due for recheck  Patient is advised the following: Patient Instructions  blood tests are being requested for you today.  We'll contact you with results.  Please come back for a follow-up appointment in 1 year.   Given the fact that we haven't heard from the cancer in years, you can shoot for a normal thyroid level now.

## 2015-12-13 LAB — THYROGLOBULIN LEVEL: Thyroglobulin: 0.2 ng/mL — ABNORMAL LOW (ref 2.8–40.9)

## 2015-12-13 LAB — THYROGLOBULIN ANTIBODY

## 2015-12-19 ENCOUNTER — Encounter: Payer: Self-pay | Admitting: Internal Medicine

## 2015-12-19 MED ORDER — ALPRAZOLAM 0.5 MG PO TABS: ORAL_TABLET | ORAL | Status: DC

## 2015-12-19 NOTE — Telephone Encounter (Signed)
Medication printed signed and faxed

## 2015-12-19 NOTE — Telephone Encounter (Signed)
Done hardcopy to Corinne  

## 2015-12-26 ENCOUNTER — Ambulatory Visit: Payer: BLUE CROSS/BLUE SHIELD

## 2015-12-30 ENCOUNTER — Ambulatory Visit: Payer: Managed Care, Other (non HMO) | Admitting: Internal Medicine

## 2016-01-02 ENCOUNTER — Encounter: Payer: Self-pay | Admitting: Internal Medicine

## 2016-01-07 ENCOUNTER — Encounter: Payer: Self-pay | Admitting: Internal Medicine

## 2016-01-07 MED ORDER — ALPRAZOLAM 0.5 MG PO TABS: ORAL_TABLET | ORAL | Status: DC

## 2016-01-07 NOTE — Telephone Encounter (Signed)
Done hardcopy to Corinne  

## 2016-01-07 NOTE — Telephone Encounter (Signed)
Medication printed signed and faxed to pharmacy  

## 2016-01-09 ENCOUNTER — Ambulatory Visit
Admission: RE | Admit: 2016-01-09 | Discharge: 2016-01-09 | Disposition: A | Discharge status: Home / Self Care | Payer: Managed Care, Other (non HMO) | Source: Ambulatory Visit

## 2016-01-09 DIAGNOSIS — Z1231 Encounter for screening mammogram for malignant neoplasm of breast: Secondary | ICD-10-CM

## 2016-01-15 ENCOUNTER — Encounter: Payer: Self-pay | Admitting: Gastroenterology

## 2016-02-24 ENCOUNTER — Other Ambulatory Visit: Payer: Self-pay | Admitting: *Deleted

## 2016-02-24 MED ORDER — LEVOTHYROXINE SODIUM 137 MCG PO TABS: 137.0000 ug | ORAL_TABLET | Freq: Every day | ORAL | Status: DC

## 2016-03-09 ENCOUNTER — Ambulatory Visit (INDEPENDENT_AMBULATORY_CARE_PROVIDER_SITE_OTHER): Payer: Managed Care, Other (non HMO) | Admitting: Gastroenterology

## 2016-03-09 ENCOUNTER — Encounter: Payer: Self-pay | Admitting: Gastroenterology

## 2016-03-09 VITALS — BP 120/90 | HR 72 | Ht 63.5 in | Wt 166.0 lb

## 2016-03-09 DIAGNOSIS — K5909 Other constipation: Secondary | ICD-10-CM

## 2016-03-09 DIAGNOSIS — Z8371 Family history of colonic polyps: Secondary | ICD-10-CM

## 2016-03-09 MED ORDER — POLYETHYLENE GLYCOL 3350 17 GM/SCOOP PO POWD: 1.0000 | Freq: Every day | ORAL | Status: DC

## 2016-03-09 MED ORDER — NA SULFATE-K SULFATE-MG SULF 17.5-3.13-1.6 GM/177ML PO SOLN: 1.0000 | Freq: Once | ORAL | Status: DC

## 2016-03-09 NOTE — Patient Instructions (Addendum)
You have been scheduled for a colonoscopy. Please follow written instructions given to you at your visit today.  Please pick up your prep supplies at the pharmacy within the next 1-3 days. If you use inhalers (even only as needed), please bring them with you on the day of your procedure.    We have sent the following medications to your pharmacy for you to pick up at your convenience:  Miralax - 2 capfuls in water or juice twice a day

## 2016-03-09 NOTE — Progress Notes (Signed)
HPI :  50 y/o female with history of thyroid cancer and hypothyroidism, seen in consultation today for symptoms of constipation / bloating for Dr. Cathlean Cower.   Patient reports she had fairly regular bowel habits until this past October 2016 when she developed severe constipation. Since that time she has been having one BM every few weeks or so. She reports when she does not have a bowel movement she has significant distension of her abdomen. She thinks she has improvement in her bloating with a bowel movement and it is improved. She has straining and passing some hard stools. No blood in the stools. Bloating is all over, no focal pain. She has tried mag citrate which helped to produce a small stool.' she has not used anything else for this routinely. She has not tried miralax. No weight loss. No FH of colon cancer, but father had colon polyps, around age 10 or so. Sister had colon polyps in her at age 67 as well. Patient, father, and sister have all had thyroid cancer. She had radiation therapy for this in 2013. Mother also had breast cancer. Prior to October she had multiple bowel movements per week. She takes hydrocodone PRN for back pain, new since October. She has been taking it rarely, twice per week at most.   No prior colon cancer screening.   Past Medical History  Diagnosis Date  . HYPERLIPIDEMIA     diet controlled, no meds  . DEPRESSION   . ANXIETY   . ALLERGIC RHINITIS   . Thyroid carcinoma ([place])   . Hypothyroidism   . Fibroid   . Anemia      Past Surgical History  Procedure Laterality Date  . Tubal ligation    . Total thyroidectomy  2009/2010  . Cesarean section  2002    x 1  . Wisdom tooth extraction    . Bilateral salpingectomy Bilateral 01/01/2015    Procedure: BILATERAL SALPINGECTOMY        ;  Surgeon: Everardo All Amundson de Berton Lan, MD;  Location: [facility];  Service: Gynecology;  Laterality: Bilateral;  . Abdominal hysterectomy N/A 01/01/2015    Procedure:   SUPRACERVICAL HYSTERECTOMY WITH BILATERAL SALPINGECTOMT  Surgeon: Jamey Reas de Berton Lan, MD;  Location: [facility];  Service: Gynecology;  Laterality: N/A;   Family History  Problem Relation Age of Onset  . Cancer Maternal Grandmother   . Hypertension Other   . Stroke Other   . Breast cancer Mother     5-6 years ago  . Breast cancer Paternal Grandmother   . Diabetes Father    Social History  Substance Use Topics  . Smoking status: Current Every Day Smoker -- 0.25 packs/day for 31 years    Types: Cigarettes  . Smokeless tobacco: Never Used  . Alcohol Use: 3.6 oz/week    6 Standard drinks or equivalent per week     Comment: 6 beers per week   Current Outpatient Prescriptions  Medication Sig Dispense Refill  . ALPRAZolam (XANAX) 0.5 MG tablet 1/2 - 1 tab by mouth daily as needed 30 tablet 2  . aspirin 81 MG tablet Take 81 mg by mouth daily.    Marland Kitchen BIOTIN PO Take 1 tablet by mouth daily. Reported on 12/11/2015    . levothyroxine (SYNTHROID, LEVOTHROID) 137 MCG tablet Take 1 tablet (137 mcg total) by mouth daily before breakfast. 90 tablet 1  . Omega-3 Fatty Acids (FISH OIL PO) Take 1 tablet by mouth daily.  No current facility-administered medications for this visit.   Allergies  Allergen Reactions  . Doxycycline Nausea Only  . Sulfonamide Derivatives Itching     Review of Systems: All systems reviewed and negative except where noted in HPI.   Lab Results  Component Value Date   WBC 10.9* 08/13/2015   HGB 15.0 08/13/2015   HCT 44.0 08/13/2015   MCV 93.8 08/13/2015   PLT 331.0 08/13/2015   Lab Results  Component Value Date   CREATININE 0.59 08/13/2015   BUN 10 08/13/2015   NA 136 08/13/2015   K 3.9 08/13/2015   CL 101 08/13/2015   CO2 28 08/13/2015    Lab Results  Component Value Date   ALT 15 08/13/2015   AST 15 08/13/2015   ALKPHOS 86 08/13/2015   BILITOT 0.3 08/13/2015      Physical Exam: BP 120/90 mmHg  Pulse 72  Ht 5' 3.5" (1.613 m)   Wt 166 lb (75.297 kg)  BMI 28.94 kg/m2  LMP 12/05/2014 (Approximate) Constitutional: Pleasant,well-developed, female in no acute distress. HEENT: Normocephalic and atraumatic. Conjunctivae are normal. No scleral icterus. Neck supple.  Cardiovascular: Normal rate, regular rhythm.  Pulmonary/chest: Effort normal and breath sounds normal. No wheezing, rales or rhonchi. Abdominal: Soft, protuberant,, nontender. Bowel sounds active throughout. There are no masses palpable. No hepatomegaly. Extremities: no edema Lymphadenopathy: No cervical adenopathy noted. Neurological: Alert and oriented to person place and time. Skin: Skin is warm and dry. No rashes noted. Psychiatric: Normal mood and affect. Behavior is normal.   ASSESSMENT AND PLAN: 50 y/o female presenting with rather acute onset of severe constipation since October '16, having one BM up to every 2 weeks. She has taken some narcotics for back pain around that time, and this may likely be related; however, she doesn't take much narcotic (1/2 tab x 1-2 every week or so) and her constipation is quite severe. She has a personal history of thyroid cancer, and sister has had multiple "colon polyps" removed around age 30s. In light of her this history and acute onset of symptoms I offered her a colonoscopy for piece of mind, as she is concerned about the possibility of colon cancer, although again reassured her I think a component of this is related to narcotic use and she should avoid all narcotics if possible. Recommend tylenol PRN for her back pain. Otherwise she can use Miralax and titrate to effect to have goal one BM every 1-2 days. She will likely need high dose miralax to start, and counseled her how to titrate this. If no improvement she will contact me, and can consider fleet enema to help stimulate a bowel movement as well. The indications, risks, and benefits of colonoscopy were explained to the patient in detail. Risks include but are not  limited to bleeding, perforation, adverse reaction to medications, and cardiopulmonary compromise. Sequelae include but are not limited to the possibility of surgery, hospitalization, and mortality. The patient verbalized understanding and wished to proceed. All questions answered, referred to the scheduler and bowel prep ordered. Further recommendations pending results of the exam.   [place] Cellar, MD [facility] Pager [phone]  CC: Biagio Borg, MD

## 2016-04-01 ENCOUNTER — Encounter: Payer: Self-pay | Admitting: Gastroenterology

## 2016-04-07 ENCOUNTER — Telehealth: Payer: Self-pay

## 2016-04-07 ENCOUNTER — Encounter: Payer: Self-pay | Admitting: Obstetrics and Gynecology

## 2016-04-07 NOTE — Telephone Encounter (Signed)
Non-Urgent Medical Question  Message [phone]   From  [place], MD   Sent  04/07/2016 11:10 AM     Hey Dr Quincy Simmonds quick question I have been having some sharp pains here and there in my left breast and its like when I breathe it gets worse then goes away just here and there somedays or weeks nothing and then it happens it this anything to worry about? you know me :)      Responsible Party    Pool - [facility] Pool No one has taken responsibility for this message.     No actions have been taken on this message.     Spoke with patient. Advised she will need to be seen in the office for further evaluation of left breast pain. Patient is agreeable. Appointment scheduled for 04/20/2016 at 3 pm with Dr.Silva. Patient declines earlier appointment. Aware if she develops any new symptoms or pain increases she will need to be seen in our office earlier for evaluation. She is agreeable.  Routing to provider for final review. Patient agreeable to disposition. Will close encounter.

## 2016-04-07 NOTE — Telephone Encounter (Signed)
Spoke with patient via telephone. Please see telephone encounter dated with today's date.

## 2016-04-14 ENCOUNTER — Ambulatory Visit (AMBULATORY_SURGERY_CENTER): Payer: Managed Care, Other (non HMO) | Admitting: Gastroenterology

## 2016-04-14 ENCOUNTER — Encounter: Payer: Self-pay | Admitting: Gastroenterology

## 2016-04-14 VITALS — BP 118/83 | HR 67 | Temp 98.9°F | Resp 12 | Ht 63.5 in | Wt 166.0 lb

## 2016-04-14 DIAGNOSIS — D127 Benign neoplasm of rectosigmoid junction: Secondary | ICD-10-CM

## 2016-04-14 DIAGNOSIS — D122 Benign neoplasm of ascending colon: Secondary | ICD-10-CM | POA: Diagnosis not present

## 2016-04-14 DIAGNOSIS — K635 Polyp of colon: Secondary | ICD-10-CM

## 2016-04-14 DIAGNOSIS — R194 Change in bowel habit: Secondary | ICD-10-CM | POA: Diagnosis not present

## 2016-04-14 DIAGNOSIS — K5900 Constipation, unspecified: Secondary | ICD-10-CM

## 2016-04-14 DIAGNOSIS — D123 Benign neoplasm of transverse colon: Secondary | ICD-10-CM

## 2016-04-14 MED ORDER — SODIUM CHLORIDE 0.9 % IV SOLN: 500.0000 mL | INTRAVENOUS | Status: DC

## 2016-04-14 NOTE — Progress Notes (Signed)
Stable to RR 

## 2016-04-14 NOTE — Progress Notes (Signed)
Called to room to assist during endoscopic procedure.  Patient ID and intended procedure confirmed with present staff. Received instructions for my participation in the procedure from the performing physician.  

## 2016-04-14 NOTE — Op Note (Signed)
[place] Patient Name: [name] Procedure Date: 04/14/2016 10:13 AM MRN: [id number] Endoscopist: Remo Lipps P. Havery Moros , MD Age: 50 Referring MD:  Date of Birth: August 21, 1966 Gender: Female Procedure:                Colonoscopy Indications:              This is the patient's first colonoscopy, Change in                            bowel habits, family history of polyps at a young                            age Medicines:                Monitored Anesthesia Care Procedure:                Pre-Anesthesia Assessment:                           - Prior to the procedure, a History and Physical                            was performed, and patient medications and                            allergies were reviewed. The patient's tolerance of                            previous anesthesia was also reviewed. The risks                            and benefits of the procedure and the sedation                            options and risks were discussed with the patient.                            All questions were answered, and informed consent                            was obtained. Prior Anticoagulants: The patient has                            taken aspirin, last dose was 1 day prior to                            procedure. ASA Grade Assessment: II - A patient                            with mild systemic disease. After reviewing the                            risks and benefits, the patient was deemed in  satisfactory condition to undergo the procedure.                           After obtaining informed consent, the colonoscope                            was passed under direct vision. Throughout the                            procedure, the patient's blood pressure, pulse, and                            oxygen saturations were monitored continuously. The                            Model PCF-H190DL [id number]) scope was introduced            through the anus and advanced to the the terminal                            ileum, with identification of the appendiceal                            orifice and IC valve. The colonoscopy was performed                            without difficulty. The patient tolerated the                            procedure well. The quality of the bowel                            preparation was good. The terminal ileum, ileocecal                            valve, appendiceal orifice, and rectum were                            photographed. Scope In: 10:33:21 AM Scope Out: 10:51:16 AM Scope Withdrawal Time: 0 hours 15 minutes 56 seconds  Total Procedure Duration: 0 hours 17 minutes 55 seconds  Findings:                 The perianal and digital rectal examinations were                            normal.                           A 4 mm polyp was found in the ascending colon. The                            polyp was sessile. The polyp was removed with a  cold snare. Resection and retrieval were complete.                           A 3 mm polyp was found in the transverse colon. The                            polyp was flat. The polyp was removed with a cold                            snare. Resection and retrieval were complete.                           A 5 mm polyp was found in the recto-sigmoid colon.                            The polyp was pedunculated. The polyp was removed                            with a hot snare. Resection and retrieval were                            complete.                           The terminal ileum appeared normal.                           The exam was otherwise without abnormality on                            direct and retroflexion views. Complications:            No immediate complications. Estimated blood loss:                            Minimal. Estimated Blood Loss:     Estimated blood loss was minimal. Impression:                - One 4 mm polyp in the ascending colon, removed                            with a cold snare. Resected and retrieved.                           - One 3 mm polyp in the transverse colon, removed                            with a cold snare. Resected and retrieved.                           - One 5 mm polyp at the recto-sigmoid colon,                            removed with a hot snare. Resected and  retrieved.                           - The examined portion of the ileum was normal.                           - The examination was otherwise normal on direct                            and retroflexion views. Recommendation:           - Patient has a contact number available for                            emergencies. The signs and symptoms of potential                            delayed complications were discussed with the                            patient. Return to normal activities tomorrow.                            Written discharge instructions were provided to the                            patient.                           - Resume previous diet.                           - Continue present medications.                           - Await pathology results.                           - Repeat colonoscopy is recommended for                            surveillance. The colonoscopy date will be                            determined after pathology results from today's                            exam become available for review.                           - No aspirin, ibuprofen, naproxen, or other                            non-steroidal anti-inflammatory drugs for 2 weeks                            after polyp removal. Remo Lipps P. Armbruster,  MD 04/14/2016 10:56:03 AM This report has been signed electronically.

## 2016-04-14 NOTE — Patient Instructions (Signed)
YOU HAD AN ENDOSCOPIC PROCEDURE TODAY AT [facility]:   Refer to the procedure report that was given to you for any specific questions about what was found during the examination.  If the procedure report does not answer your questions, please call your gastroenterologist to clarify.  If you requested that your care partner not be given the details of your procedure findings, then the procedure report has been included in a sealed envelope for you to review at your convenience later.  YOU SHOULD EXPECT: Some feelings of bloating in the abdomen. Passage of more gas than usual.  Walking can help get rid of the air that was put into your GI tract during the procedure and reduce the bloating. If you had a lower endoscopy (such as a colonoscopy or flexible sigmoidoscopy) you may notice spotting of blood in your stool or on the toilet paper. If you underwent a bowel prep for your procedure, you may not have a normal bowel movement for a few days.  Please Note:  You might notice some irritation and congestion in your nose or some drainage.  This is from the oxygen used during your procedure.  There is no need for concern and it should clear up in a day or so.  SYMPTOMS TO REPORT IMMEDIATELY:   Following lower endoscopy (colonoscopy or flexible sigmoidoscopy):  Excessive amounts of blood in the stool  Significant tenderness or worsening of abdominal pains  Swelling of the abdomen that is new, acute  Fever of 100F or higher  For urgent or emergent issues, a gastroenterologist can be reached at any hour by calling [phone].   DIET: Your first meal following the procedure should be a small meal and then it is ok to progress to your normal diet. Heavy or fried foods are harder to digest and may make you feel nauseous or bloated.  Likewise, meals heavy in dairy and vegetables can increase bloating.  Drink plenty of fluids but you should avoid alcoholic beverages for 24  hours.  ACTIVITY:  You should plan to take it easy for the rest of today and you should NOT DRIVE or use heavy machinery until tomorrow (because of the sedation medicines used during the test).    FOLLOW UP: Our staff will call the number listed on your records the next business day following your procedure to check on you and address any questions or concerns that you may have regarding the information given to you following your procedure. If we do not reach you, we will leave a message.  However, if you are feeling well and you are not experiencing any problems, there is no need to return our call.  We will assume that you have returned to your regular daily activities without incident.  If any biopsies were taken you will be contacted by phone or by letter within the next 1-3 weeks.  Please call us at [phone] if you have not heard about the biopsies in 3 weeks.    SIGNATURES/CONFIDENTIALITY: You and/or your care partner have signed paperwork which will be entered into your electronic medical record.  These signatures attest to the fact that that the information above on your After Visit Summary has been reviewed and is understood.  Full responsibility of the confidentiality of this discharge information lies with you and/or your care-partner.  Please read polyp handout provided. No aspirin, ibuprofen, naproxen, aleve, or other non-steroidal anti-inflammatory drugs for 2 weeks after polyp removal.

## 2016-04-15 ENCOUNTER — Telehealth: Payer: Self-pay

## 2016-04-15 NOTE — Telephone Encounter (Signed)
  Follow up Call-  Call back number 04/14/2016  Post procedure Call Back phone  # [phone]  Permission to leave phone message Yes     Patient questions:  Do you have a fever, pain , or abdominal swelling? No. Pain Score  0 *  Have you tolerated food without any problems? Yes.    Have you been able to return to your normal activities? Yes.    Do you have any questions about your discharge instructions: Diet   No. Medications  No. Follow up visit  No.  Do you have questions or concerns about your Care? No.  Actions: * If pain score is 4 or above: No action needed, pain <4.

## 2016-04-16 ENCOUNTER — Ambulatory Visit (INDEPENDENT_AMBULATORY_CARE_PROVIDER_SITE_OTHER): Payer: Managed Care, Other (non HMO) | Admitting: Obstetrics and Gynecology

## 2016-04-16 ENCOUNTER — Encounter: Payer: Self-pay | Admitting: Obstetrics and Gynecology

## 2016-04-16 VITALS — BP 122/82 | HR 88 | Ht 63.5 in | Wt 165.0 lb

## 2016-04-16 DIAGNOSIS — N644 Mastodynia: Secondary | ICD-10-CM

## 2016-04-16 DIAGNOSIS — R140 Abdominal distension (gaseous): Secondary | ICD-10-CM

## 2016-04-16 NOTE — Progress Notes (Signed)
Patient ID: [name], female   DOB: 09-01-1966, 50 y.o.   MRN: [id number] GYNECOLOGY  VISIT   HPI: 50 y.o.   Married  Caucasian  female   G1P1001 with Patient's last menstrual period was 12/05/2014 (approximate).   here for left breast pain for 3 weeks.  Patient describes this as a shooting pain that comes and goes.   Stabbing pain in left breast came and went. Pain was significant on left lateral breast.   Gone now. Doing yard work.  Increased lifting.   No nipple discharge.  Worried about cancer. Friend has stage IV breast cancer.  Having abdominal bloating.  Had colonoscopy and had 3 polyps removed.  GI told her the bloating was not gastrointestinal in source.  GYNECOLOGIC HISTORY: Patient's last menstrual period was 12/05/2014 (approximate). Contraception:  Hysterectomy Menopausal hormone therapy:  none Last mammogram:  01-10-16 3D/Neg/[facility] Last pap smear:   04-11-15 ASCUS:Neg        OB History    Gravida Para Term Preterm AB TAB SAB Ectopic Multiple Living   1 1 1       1          Patient Active Problem List   Diagnosis Date Noted  . Cough 08/13/2015  . Status post abdominal supracervical subtotal hysterectomy 02/10/2015  . Status post total abdominal hysterectomy 01/01/2015  . Elevated blood pressure 12/18/2014  . Smoker 12/18/2014  . Other chest pain 09/10/2014  . Right flank pain 09/05/2014  . Recurrent low back pain 06/12/2014  . Malignant neoplasm of thyroid gland ([place]) 09/07/2011  . 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 Hypocalcemia 08/31/2011  . Preventative health care 03/17/2011  . LUMBAR RADICULOPATHY, RIGHT 08/21/2008  . PARESTHESIA 07/30/2008  . HYPERLIPIDEMIA 07/18/2008  . ANXIETY 07/18/2008  . DEPRESSION 07/18/2008  . ALLERGIC RHINITIS 07/18/2008    Past Medical History  Diagnosis Date  . HYPERLIPIDEMIA     diet controlled, no meds  . DEPRESSION   . ANXIETY   . ALLERGIC RHINITIS   . Thyroid carcinoma ([place])    . Hypothyroidism   . Fibroid   . Anemia     Past Surgical History  Procedure Laterality Date  . Tubal ligation    . Total thyroidectomy  2009/2010  . Cesarean section  2002    x 1  . Wisdom tooth extraction    . Bilateral salpingectomy Bilateral 01/01/2015    Procedure: BILATERAL SALPINGECTOMY        ;  Surgeon: Everardo All Amundson de Berton Lan, MD;  Location: [facility];  Service: Gynecology;  Laterality: Bilateral;  . Abdominal hysterectomy N/A 01/01/2015    Procedure:  SUPRACERVICAL HYSTERECTOMY WITH BILATERAL SALPINGECTOMT  Surgeon: Jamey Reas de Berton Lan, MD;  Location: [facility];  Service: Gynecology;  Laterality: N/A;    Current Outpatient Prescriptions  Medication Sig Dispense Refill  . ALPRAZolam (XANAX) 0.5 MG tablet 1/2 - 1 tab by mouth daily as needed 30 tablet 2  . aspirin 81 MG tablet Take 81 mg by mouth daily.    Marland Kitchen BIOTIN PO Take 1 tablet by mouth daily. Reported on 12/11/2015    . levothyroxine (SYNTHROID, LEVOTHROID) 137 MCG tablet Take 1 tablet (137 mcg total) by mouth daily before breakfast. 90 tablet 1  . Omega-3 Fatty Acids (FISH OIL PO) Take 1 tablet by mouth daily.     No current facility-administered medications for this visit.     ALLERGIES: Doxycycline and Sulfonamide derivatives  Family History  Problem Relation Age of Onset  . Cancer Maternal Grandmother   . Hypertension Other   . Stroke Other   . Breast cancer Mother     5-6 years ago  . Breast cancer Paternal Grandmother   . Diabetes Father   . Colon cancer Neg Hx     Social History   Social History  . Marital Status: Married    Spouse Name: N/A  . Number of Children: 1  . Years of Education: N/A   Occupational History  . customer service    Social History Main Topics  . Smoking status: Current Every Day Smoker -- 0.25 packs/day for 31 years    Types: Cigarettes  . Smokeless tobacco: Never Used  . Alcohol Use: 3.6 oz/week    6 Standard drinks or equivalent per week      Comment: 6 beers per week  . Drug Use: No  . Sexual Activity:    Partners: Male    Birth Control/ Protection: Surgical     Comment: tubal/TAH   Other Topics Concern  . Not on file   Social History Narrative    ROS:  Pertinent items are noted in HPI.  PHYSICAL EXAMINATION:    BP 122/82 mmHg  Pulse 88  Ht 5' 3.5" (1.613 m)  Wt 165 lb (74.844 kg)  BMI 28.77 kg/m2  LMP 12/05/2014 (Approximate)    General appearance: alert, cooperative and appears stated age   Breasts: normal appearance, no masses or tenderness, Inspection negative, No nipple retraction or dimpling, No nipple discharge or bleeding, No axillary or supraclavicular adenopathy, left lateral breast pain with no mass palpable.     Chaperone was present for exam.  ASSESSMENT  Left breast pain.  FH of breast cancer in patient's mother.  Abdominal bloating. May be related to ovulation.  Status post supracervical hysterectomy with bilateral salpingectomy.   PLAN  Discussion of breast pain.  Proceed with diagnostic left mammogram and ultrasound at [facility].  Ibuprofen 600 mg po q 6 hours prn pain.  Return for annual exams and prn.   An After Visit Summary was printed and given to the patient.  _15_____ minutes face to face time of which over 50% was spent in counseling.

## 2016-04-16 NOTE — Progress Notes (Signed)
Scheduled patient while in office for left breast diagnostic mammogram [facility] on 05[facility]04[facility]2016 at 3:30 pm. Patient declines earlier appointment. Placed in mammogram hold.

## 2016-04-17 ENCOUNTER — Encounter: Payer: Self-pay | Admitting: Obstetrics and Gynecology

## 2016-04-20 ENCOUNTER — Ambulatory Visit: Payer: BLUE CROSS/BLUE SHIELD | Admitting: Obstetrics and Gynecology

## 2016-04-22 ENCOUNTER — Encounter: Payer: Self-pay | Admitting: Gastroenterology

## 2016-04-23 ENCOUNTER — Encounter: Payer: Self-pay | Admitting: Gastroenterology

## 2016-05-04 ENCOUNTER — Other Ambulatory Visit: Payer: Managed Care, Other (non HMO)

## 2016-05-12 ENCOUNTER — Ambulatory Visit
Admission: RE | Admit: 2016-05-12 | Discharge: 2016-05-12 | Disposition: A | Discharge status: Home / Self Care | Payer: Managed Care, Other (non HMO) | Source: Ambulatory Visit | Attending: Obstetrics and Gynecology | Admitting: Obstetrics and Gynecology

## 2016-05-12 DIAGNOSIS — N644 Mastodynia: Secondary | ICD-10-CM

## 2016-07-15 ENCOUNTER — Encounter: Payer: Self-pay | Admitting: Internal Medicine

## 2016-07-15 NOTE — Telephone Encounter (Signed)
[place] for [employer] -   tammy or heather to assist please

## 2016-07-19 ENCOUNTER — Other Ambulatory Visit: Payer: Self-pay | Admitting: Endocrinology

## 2016-07-21 ENCOUNTER — Ambulatory Visit (INDEPENDENT_AMBULATORY_CARE_PROVIDER_SITE_OTHER): Payer: Managed Care, Other (non HMO) | Admitting: Internal Medicine

## 2016-07-21 ENCOUNTER — Other Ambulatory Visit (INDEPENDENT_AMBULATORY_CARE_PROVIDER_SITE_OTHER): Payer: Managed Care, Other (non HMO)

## 2016-07-21 ENCOUNTER — Encounter: Payer: Self-pay | Admitting: Internal Medicine

## 2016-07-21 VITALS — BP 136/80 | HR 87 | Temp 98.0°F | Resp 20 | Wt 169.0 lb

## 2016-07-21 DIAGNOSIS — R6889 Other general symptoms and signs: Secondary | ICD-10-CM | POA: Diagnosis not present

## 2016-07-21 DIAGNOSIS — F411 Generalized anxiety disorder: Secondary | ICD-10-CM | POA: Diagnosis not present

## 2016-07-21 DIAGNOSIS — M545 Low back pain: Secondary | ICD-10-CM

## 2016-07-21 DIAGNOSIS — Z0001 Encounter for general adult medical examination with abnormal findings: Secondary | ICD-10-CM | POA: Diagnosis not present

## 2016-07-21 DIAGNOSIS — K5900 Constipation, unspecified: Secondary | ICD-10-CM

## 2016-07-21 DIAGNOSIS — E785 Hyperlipidemia, unspecified: Secondary | ICD-10-CM | POA: Diagnosis not present

## 2016-07-21 DIAGNOSIS — K5909 Other constipation: Secondary | ICD-10-CM

## 2016-07-21 DIAGNOSIS — G8929 Other chronic pain: Secondary | ICD-10-CM

## 2016-07-21 LAB — BASIC METABOLIC PANEL
BUN: 15 mg/dL (ref 6–23)
CHLORIDE: 103 meq/L (ref 96–112)
CO2: 29 meq/L (ref 19–32)
CREATININE: 0.64 mg/dL (ref 0.40–1.20)
Calcium: 9 mg/dL (ref 8.4–10.5)
GFR: 104.9 mL/min (ref >60.00)
Glucose, Bld: 86 mg/dL (ref 70–99)
POTASSIUM: 4.2 meq/L (ref 3.5–5.1)
SODIUM: 138 meq/L (ref 135–145)

## 2016-07-21 LAB — CBC WITH DIFFERENTIAL/PLATELET
BASOS ABS: 0.1 10*3/uL (ref 0.0–0.1)
Basophils Relative: 0.8 % (ref 0.0–3.0)
EOS ABS: 0.2 10*3/uL (ref 0.0–0.7)
Eosinophils Relative: 2.1 % (ref 0.0–5.0)
HEMATOCRIT: 42.6 % (ref 36.0–46.0)
Hemoglobin: 14.7 g/dL (ref 12.0–15.0)
LYMPHS PCT: 39.5 % (ref 12.0–46.0)
Lymphs Abs: 3.7 10*3/uL (ref 0.7–4.0)
MCHC: 34.6 g/dL (ref 30.0–36.0)
MCV: 92.3 fl (ref 78.0–100.0)
Monocytes Absolute: 0.7 10*3/uL (ref 0.1–1.0)
Monocytes Relative: 8 % (ref 3.0–12.0)
NEUTROS ABS: 4.7 10*3/uL (ref 1.4–7.7)
NEUTROS PCT: 49.6 % (ref 43.0–77.0)
PLATELETS: 346 10*3/uL (ref 150.0–400.0)
RBC: 4.62 Mil/uL (ref 3.87–5.11)
RDW: 12.5 % (ref 11.5–15.5)
WBC: 9.4 10*3/uL (ref 4.0–10.5)

## 2016-07-21 LAB — HEPATIC FUNCTION PANEL
ALBUMIN: 4.1 g/dL (ref 3.5–5.2)
ALT: 17 U/L (ref 0–35)
AST: 14 U/L (ref 0–37)
Alkaline Phosphatase: 84 U/L (ref 39–117)
BILIRUBIN DIRECT: 0.1 mg/dL (ref 0.0–0.3)
TOTAL PROTEIN: 7.5 g/dL (ref 6.0–8.3)
Total Bilirubin: 0.2 mg/dL (ref 0.2–1.2)

## 2016-07-21 LAB — URINALYSIS, ROUTINE W REFLEX MICROSCOPIC
Bilirubin Urine: NEGATIVE
Hgb urine dipstick: NEGATIVE
Ketones, ur: NEGATIVE
Leukocytes, UA: NEGATIVE
Nitrite: NEGATIVE
SPECIFIC GRAVITY, URINE: 1.025 (ref 1.000–1.030)
TOTAL PROTEIN, URINE-UPE24: NEGATIVE
URINE GLUCOSE: NEGATIVE
UROBILINOGEN UA: 0.2 (ref 0.0–1.0)
pH: 6 (ref 5.0–8.0)

## 2016-07-21 LAB — LIPID PANEL
CHOLESTEROL: 238 mg/dL — AB (ref 0–200)
HDL: 58.5 mg/dL (ref >39.00)
LDL Cholesterol: 152 mg/dL — ABNORMAL HIGH (ref 0–99)
NonHDL: 179.12
Total CHOL/HDL Ratio: 4
Triglycerides: 138 mg/dL (ref 0.0–149.0)
VLDL: 27.6 mg/dL (ref 0.0–40.0)

## 2016-07-21 LAB — TSH: TSH: 0.73 u[IU]/mL (ref 0.35–4.50)

## 2016-07-21 MED ORDER — LINACLOTIDE 290 MCG PO CAPS: 290.0000 ug | ORAL_CAPSULE | Freq: Every day | ORAL | 3 refills | Status: DC

## 2016-07-21 MED ORDER — HYDROCODONE-ACETAMINOPHEN 5-325MG PREPACK (~~LOC~~: 1.0000 | ORAL_TABLET | Freq: Every day | ORAL | 0 refills | Status: DC | PRN

## 2016-07-21 NOTE — Patient Instructions (Signed)
You had the flu shot today  Please take all new medication as prescribed - the linzess (sent to [facility]) - you may wish to call to make sure it is covered  Please continue all other medications as before, and refills have been done if requested - the hydrocodone  Please have the pharmacy call with any other refills you may need.  Please continue your efforts at being more active, low cholesterol diet, and weight control.  You are otherwise up to date with prevention measures today.  Please keep your appointments with your specialists as you may have planned  Please go to the LAB in the Basement (turn left off the elevator) for the tests to be done today  You will be contacted by phone if any changes need to be made immediately.  Otherwise, you will receive a letter about your results with an explanation, but please check with MyChart first.  Please remember to sign up for MyChart if you have not done so, as this will be important to you in the future with finding out test results, communicating by private email, and scheduling acute appointments online when needed.  Please return in 1 year for your yearly visit, or sooner if needed, with Lab testing done 3-5 days before

## 2016-07-21 NOTE — Progress Notes (Signed)
Pre visit review using our clinic review tool, if applicable. No additional management support is needed unless otherwise documented below in the visit note. 

## 2016-07-21 NOTE — Progress Notes (Signed)
Subjective:    Patient ID: [name], female    DOB: 08-02-1966, 50 y.o.   MRN: [id number]  HPI Here for wellness and f/u;  Overall doing ok;  Pt denies Chest pain, worsening SOB, DOE, wheezing, orthopnea, PND, worsening LE edema, palpitations, dizziness or syncope.  Pt denies neurological change such as new headache, facial or extremity weakness.  Pt denies polydipsia, polyuria, or low sugar symptoms. Pt states overall good compliance with treatment and medications, good tolerability, and has been trying to follow appropriate diet.  Pt denies worsening depressive symptoms, suicidal ideation or panic. No fever, night sweats, wt loss, loss of appetite, or other constitutional symptoms.  Pt states good ability with ADL's, has low fall risk, home safety reviewed and adequate, no other significant changes in hearing or vision, and only occasionally active with exercise. Has gained some wt.   Wt Readings from Last 3 Encounters:  07/21/16 169 lb (76.7 kg)  04/16/16 165 lb (74.8 kg)  04/14/16 166 lb (75.3 kg)   C/o bloating and constipation chronic, mild to mod for over 1 yr, with occas nausea, struggles every day, nothing seems to make better such as otc laxative. Mag citrate helped somewhat 2 mo ago but not tried recenlty. Miralax daily not helping. Last colonoscopy may 2017.    Pt continues to have recurring LBP without change in severity, bowel or bladder change, fever, wt loss,  worsening LE pain/numbness/weakness, gait change or falls.  Tries not take the hydrocodone very often as can sometimes make the constipation worse.  Denies worsening depressive symptoms, suicidal ideation, or panic; has ongoing anxiety, some increased recently, son passed out x 1 with one episode binge drinking recently.   Past Medical History:  Diagnosis Date  . ALLERGIC RHINITIS   . Anemia   . ANXIETY   . DEPRESSION   . Fibroid   . HYPERLIPIDEMIA    diet controlled, no meds  . Hypothyroidism   . Thyroid  carcinoma [facility])    Past Surgical History:  Procedure Laterality Date  . ABDOMINAL HYSTERECTOMY N/A 01/01/2015   Procedure:  SUPRACERVICAL HYSTERECTOMY WITH BILATERAL SALPINGECTOMT  Surgeon: Jamey Reas de Berton Lan, MD;  Location: [facility];  Service: Gynecology;  Laterality: N/A;  . BILATERAL SALPINGECTOMY Bilateral 01/01/2015   Procedure: BILATERAL SALPINGECTOMY        ;  Surgeon: Everardo All Amundson de Berton Lan, MD;  Location: [facility];  Service: Gynecology;  Laterality: Bilateral;  . CESAREAN SECTION  2002   x 1  . TOTAL THYROIDECTOMY  2009/2010  . TUBAL LIGATION    . WISDOM TOOTH EXTRACTION      reports that she has been smoking Cigarettes.  She has a 7.75 pack-year smoking history. She has never used smokeless tobacco. She reports that she drinks about 3.6 oz of alcohol per week . She reports that she does not use drugs. family history includes Breast cancer in her mother and paternal grandmother; Cancer in her maternal grandmother; Diabetes in her father; Hypertension in her other; Stroke in her other. Allergies  Allergen Reactions  . Doxycycline Nausea Only  . Sulfonamide Derivatives Itching   Current Outpatient Prescriptions on File Prior to Visit  Medication Sig Dispense Refill  . ALPRAZolam (XANAX) 0.5 MG tablet 1/2 - 1 tab by mouth daily as needed 30 tablet 2  . aspirin 81 MG tablet Take 81 mg by mouth daily.    Marland Kitchen BIOTIN PO Take 1 tablet by mouth daily. Reported  on 12/11/2015    . levothyroxine (SYNTHROID, LEVOTHROID) 137 MCG tablet TAKE 1 TABLET DAILY BEFORE BREAKFAST 90 tablet 1  . Omega-3 Fatty Acids (FISH OIL PO) Take 1 tablet by mouth daily.     No current facility-administered medications on file prior to visit.     Review of Systems Constitutional: Negative for increased diaphoresis, or other activity, appetite or siginficant weight change other than noted HENT: Negative for worsening hearing loss, ear pain, facial swelling, mouth sores and neck  stiffness.   Eyes: Negative for other worsening pain, redness or visual disturbance.  Respiratory: Negative for choking or stridor Cardiovascular: Negative for other chest pain and palpitations.  Gastrointestinal: Negative for worsening diarrhea, blood in stool, or abdominal distention Genitourinary: Negative for hematuria, flank pain or change in urine volume.  Musculoskeletal: Negative for myalgias or other joint complaints.  Skin: Negative for other color change and wound or drainage.  Neurological: Negative for syncope and numbness. other than noted Hematological: Negative for adenopathy. or other swelling Psychiatric/Behavioral: Negative for hallucinations, SI, self-injury, decreased concentration or other worsening agitation.      Objective:   Physical Exam BP 136/80   Pulse 87   Temp 98 F (36.7 C) (Oral)   Resp 20   Wt 169 lb (76.7 kg)   LMP 12/05/2014 (Approximate)   SpO2 96%   BMI 29.47 kg/m  VS noted, not ill appearing, has gained wt Constitutional: Pt is oriented to person, place, and time. Appears well-developed and well-nourished, in no significant distress Head: Normocephalic and atraumatic  Eyes: Conjunctivae and EOM are normal. Pupils are equal, round, and reactive to light Right Ear: External ear normal.  Left Ear: External ear normal Nose: Nose normal.  Mouth/Throat: Oropharynx is clear and moist  Neck: Normal range of motion. Neck supple. No JVD present. No tracheal deviation present or significant neck LA or mass Cardiovascular: Normal rate, regular rhythm, normal heart sounds and intact distal pulses.   Pulmonary/Chest: Effort normal and breath sounds without rales or wheezing  Abdominal: Soft. Bowel sounds are normal. NT. No HSM  Musculoskeletal: Normal range of motion. Exhibits no edema Lymphadenopathy: Has no cervical adenopathy.  Neurological: Pt is alert and oriented to person, place, and time. Pt has normal reflexes. No cranial nerve deficit. Motor  grossly intact Skin: Skin is warm and dry. No rash noted or new ulcers Psychiatric:  Has normal mood and affect. Behavior is normal. 1+ nervous, not depressed affect    Assessment & Plan:

## 2016-07-22 ENCOUNTER — Encounter: Payer: Self-pay | Admitting: Internal Medicine

## 2016-07-22 DIAGNOSIS — R946 Abnormal results of thyroid function studies: Secondary | ICD-10-CM

## 2016-07-26 NOTE — Assessment & Plan Note (Signed)
stable overall by history and exam,  and pt to continue medical treatment as before,  to f/u any worsening symptoms or concerns, for med refill 

## 2016-07-26 NOTE — Assessment & Plan Note (Signed)
With some situational worsening recent, for med refill, o/w declines referral for counseling

## 2016-07-26 NOTE — Assessment & Plan Note (Addendum)
Possible exac by narcotic use, but will need linzess asd,  to f/u any worsening symptoms or concerns  In addition to the time spent performing CPE, I spent an additional 25 minutes face to face,in which greater than 50% of this time was spent in counseling and coordination of care for patient's acute illness as documented.

## 2016-07-26 NOTE — Assessment & Plan Note (Signed)

## 2016-07-26 NOTE — Assessment & Plan Note (Signed)
stable overall by history and exam, recent data reviewed with pt, and pt to continue medical treatment as before,  to f/u any worsening symptoms or concerns Lab Results  Component Value Date   LDLCALC 152 (H) 07/21/2016   D/w pt, declines statin for now, for lower chol diet

## 2016-07-27 NOTE — Addendum Note (Signed)
Addended by: Biagio Borg on: 07/27/2016 05:03 PM   Modules accepted: Orders

## 2016-09-23 ENCOUNTER — Encounter: Payer: Self-pay | Admitting: Internal Medicine

## 2016-09-23 ENCOUNTER — Ambulatory Visit (INDEPENDENT_AMBULATORY_CARE_PROVIDER_SITE_OTHER): Payer: Managed Care, Other (non HMO) | Admitting: Internal Medicine

## 2016-09-23 DIAGNOSIS — M25572 Pain in left ankle and joints of left foot: Secondary | ICD-10-CM | POA: Insufficient documentation

## 2016-09-23 MED ORDER — IBUPROFEN 800 MG PO TABS: 800.0000 mg | ORAL_TABLET | Freq: Three times a day (TID) | ORAL | 0 refills | Status: DC | PRN

## 2016-09-23 NOTE — Progress Notes (Signed)
Subjective:    Patient ID: [name], female    DOB: 19-Nov-1965, 50 y.o.   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  Has purposefully not walked since finished volunteer yesterday, stayed off the ankle as much as possible with elevation and ice, and swelling at least and some pain now improved.  Has picture of area on cell phone from last PM with about twice the size sweling to a localized area near and post to the medial malleolus  C/o wt gain, has several lbs recently Wt Readings from Last 3 Encounters:  09/23/16 170 lb (77.1 kg)  07/21/16 169 lb (76.7 kg)  04/16/16 165 lb (74.8 kg)   Past Medical History:  Diagnosis Date  . ALLERGIC RHINITIS   . Anemia   . ANXIETY   . DEPRESSION   . Fibroid   . HYPERLIPIDEMIA    diet controlled, no meds  . Hypothyroidism   . Thyroid carcinoma [facility])    Past Surgical History:  Procedure Laterality Date  . ABDOMINAL HYSTERECTOMY N/A 01/01/2015   Procedure:  SUPRACERVICAL HYSTERECTOMY WITH BILATERAL SALPINGECTOMT  Surgeon: Jamey Reas de Berton Lan, MD;  Location: [place] [facility];  Service: Gynecology;  Laterality: N/A;  . BILATERAL SALPINGECTOMY Bilateral 01/01/2015   Procedure: BILATERAL SALPINGECTOMY        ;  Surgeon: Everardo All Amundson de Berton Lan, MD;  Location: [place] [facility];  Service: Gynecology;  Laterality: Bilateral;  . CESAREAN SECTION  2002   x 1  . TOTAL THYROIDECTOMY  2009/2010  . TUBAL LIGATION    . WISDOM TOOTH EXTRACTION      reports that she has been smoking Cigarettes.  She has a 7.75 pack-year smoking history. She has never used smokeless tobacco. She reports that she drinks about 3.6 oz of alcohol per week . She reports that she  does not use drugs. family history includes Breast cancer in her mother and paternal grandmother; Cancer in her maternal grandmother; Diabetes in her father; Hypertension in her other; Stroke in her other. Allergies  Allergen Reactions  . Doxycycline Nausea Only  . Sulfonamide Derivatives Itching   Current Outpatient Prescriptions on File Prior to Visit  Medication Sig Dispense Refill  . ALPRAZolam (XANAX) 0.5 MG tablet 1/2 - 1 tab by mouth daily as needed 30 tablet 2  . aspirin 81 MG tablet Take 81 mg by mouth daily.    Marland Kitchen BIOTIN PO Take 1 tablet by mouth daily. Reported on 12/11/2015    . HYDROcodone-acetaminophen (VICODIN) 5-325 mg TABS tablet Take 6 tablets by mouth daily as needed. 30 tablet 0  . levothyroxine (SYNTHROID, LEVOTHROID) 137 MCG tablet TAKE 1 TABLET DAILY BEFORE BREAKFAST 90 tablet 1  . linaclotide (LINZESS) 290 MCG CAPS capsule Take 1 capsule (290 mcg total) by mouth daily before breakfast. 90 capsule 3  . Omega-3 Fatty Acids (FISH OIL PO) Take 1 tablet by mouth daily.     No current facility-administered medications on file prior to visit.    Review of Systems  All other system neg per pt    Objective:   Physical Exam  BP 138/76   Pulse 96  Temp 98.4 F (36.9 C) (Oral)   Resp 20   Wt 170 lb (77.1 kg)   LMP 12/05/2014 (Approximate)   SpO2 97%   BMI 29.64 kg/m  VS noted,  Constitutional: Pt appears in no apparent distress HENT: Head: NCAT.  Right Ear: External ear normal.  Left Ear: External ear normal.  Eyes: . Pupils are equal, round, and reactive to light. Conjunctivae and EOM are normal Neck: Normal range of motion. Neck supple.  Cardiovascular: Normal rate and regular rhythm.   Pulmonary/Chest: Effort normal and breath sounds without rales or wheezing.  Left ankle with reduced ROM to flexion/extension; 1-2+ swelling localized to 1.5 area over and post to the medial malleolus with tenderness and warmth, also mild warm to anterioromedial ankle without  swelling as well Neurological: Pt is alert. Not confused , motor grossly intact Skin: Skin is warm. No rash, no LE edema Psychiatric: Pt behavior is normal. No agitation.     Assessment & Plan:

## 2016-09-23 NOTE — Progress Notes (Signed)
Pre visit review using our clinic review tool, if applicable. No additional management support is needed unless otherwise documented below in the visit note. 

## 2016-09-23 NOTE — Patient Instructions (Signed)
Please take all new medication as prescribed- the ibuprofen if needed for pain and swelling  Please avoid prolonged standing and walking for the next 5 days if possible, and keep elevated if possible while sitting  If not improved, please call for appt with Dr Tamala Julian - Sports medicine, in this office  Please continue all other medications as before, and refills have been done if requested.  Please have the pharmacy call with any other refills you may need.  Please keep your appointments with your specialists as you may have planned

## 2016-09-23 NOTE — Assessment & Plan Note (Signed)
Exam c/w ? Ganglion cyst vs tendonitis, for nsaid prn, rest, elevation, ice, and f/u with sport med in this office if not improved in 3-5 days

## 2016-09-25 ENCOUNTER — Ambulatory Visit: Payer: Self-pay | Admitting: Obstetrics and Gynecology

## 2016-09-25 ENCOUNTER — Ambulatory Visit: Payer: Managed Care, Other (non HMO) | Admitting: Endocrinology

## 2016-09-30 ENCOUNTER — Ambulatory Visit: Payer: Managed Care, Other (non HMO) | Admitting: Endocrinology

## 2016-10-19 ENCOUNTER — Ambulatory Visit: Payer: Self-pay | Admitting: Obstetrics and Gynecology

## 2016-10-22 ENCOUNTER — Ambulatory Visit: Payer: BLUE CROSS/BLUE SHIELD | Admitting: Obstetrics and Gynecology

## 2016-11-05 NOTE — Progress Notes (Signed)
50 y.o. G70P1001 Married Caucasian female here for annual exam.    Starting a new job.  Husband may go on disability and needs a prostate biopsy and has multiple orthopedic issues.  Having bloating symptoms.  Had a colonoscopy and had polyps.  Tried Lynzess temporarily.   Wants to join a gym.   Some leakage of urine with cough.  Some vaginal irritation.  Took Monistat 1. Switched detergent and had scented toilet paper.  PCP: Dr. Cathlean Cower    Patient's last menstrual period was 12/05/2014 (approximate).           Sexually active: Yes.    The current method of family planning is status post hysterectomy--supracervical.    Exercising: No.  The patient does not participate in regular exercise at present. Smoker:  Yes, 1/4 pack a day  Health Maintenance: Pap:  10-03-15 Neg:Neg HR HPV History of abnormal Pap:  Yes,   Pap 09/28/13 - ASCUS and positive HR HPV, Colpo 10/25/13 - ECC Benign.  Pap 04/11/15 - ASCUS and neg HR HPV.  Pap 10/01/14 - ASCUS, neg HR HPV.  MMG:  01-09-16 3D/Density B/Neg/BiRads1:TBC Colonoscopy: 04/14/16-3 polyps. Repeat 5 yrs.    BMD:   n/a  Result  n/a TDaP:  2014 Gardasil:   N/A HIV: Years ago Hep C: Never Screening Labs: PCP  Hb today: PCP, Urine today: PCP   reports that she has been smoking Cigarettes.  She has a 7.75 pack-year smoking history. She has never used smokeless tobacco. She reports that she drinks about 3.6 oz of alcohol per week . She reports that she does not use drugs.  Past Medical History:  Diagnosis Date  . ALLERGIC RHINITIS   . Anemia   . ANXIETY   . DEPRESSION   . Fibroid   . HYPERLIPIDEMIA    diet controlled, no meds  . Hypothyroidism   . Thyroid carcinoma [facility])     Past Surgical History:  Procedure Laterality Date  . ABDOMINAL HYSTERECTOMY N/A 01/01/2015   Procedure:  SUPRACERVICAL HYSTERECTOMY WITH BILATERAL SALPINGECTOMT  Surgeon: Jamey Reas de Berton Lan, MD;  Location: [place] [facility];  Service: Gynecology;   Laterality: N/A;  . BILATERAL SALPINGECTOMY Bilateral 01/01/2015   Procedure: BILATERAL SALPINGECTOMY        ;  Surgeon: Everardo All Amundson de Berton Lan, MD;  Location: [place] [facility];  Service: Gynecology;  Laterality: Bilateral;  . CESAREAN SECTION  2002   x 1  . TOTAL THYROIDECTOMY  2009/2010  . TUBAL LIGATION    . WISDOM TOOTH EXTRACTION      Current Outpatient Prescriptions  Medication Sig Dispense Refill  . ALPRAZolam (XANAX) 0.5 MG tablet 1/2 - 1 tab by mouth daily as needed 30 tablet 2  . aspirin 81 MG tablet Take 81 mg by mouth daily.    Marland Kitchen BIOTIN PO Take 1 tablet by mouth daily. Reported on 12/11/2015    . ibuprofen (ADVIL,MOTRIN) 800 MG tablet Take 1 tablet (800 mg total) by mouth every 8 (eight) hours as needed. 60 tablet 0  . levothyroxine (SYNTHROID, LEVOTHROID) 137 MCG tablet TAKE 1 TABLET DAILY BEFORE BREAKFAST 90 tablet 1  . Omega-3 Fatty Acids (FISH OIL PO) Take 1 tablet by mouth daily.     No current facility-administered medications for this visit.     Family History  Problem Relation Age of Onset  . Cancer Maternal Grandmother   . Hypertension Other   . Stroke Other   . Breast cancer Mother  5-6 years ago  . Breast cancer Paternal Grandmother   . Diabetes Father   . Colon cancer Neg Hx     ROS:  Pertinent items are noted in HPI.  Otherwise, a comprehensive ROS was negative.  Exam:   BP 124/80 (BP Location: Left Arm, Patient Position: Sitting, Cuff Size: Normal)   Pulse 76   Resp 14   Ht 5' 4.5" (1.638 m)   Wt 171 lb (77.6 kg)   LMP 12/05/2014 (Approximate)   BMI 28.90 kg/m     General appearance: alert, cooperative and appears stated age Head: Normocephalic, without obvious abnormality, atraumatic Neck: no adenopathy, supple, symmetrical, trachea midline and thyroid normal to inspection and palpation Lungs: clear to auscultation bilaterally Breasts: normal appearance, no masses or tenderness, No nipple retraction or dimpling, No nipple discharge or  bleeding, No axillary or supraclavicular adenopathy Heart: regular rate and rhythm Abdomen: soft, non-tender; no masses, no organomegaly Extremities: extremities normal, atraumatic, no cyanosis or edema Skin: Skin color, texture, turgor normal. No rashes or lesions Lymph nodes: Cervical, supraclavicular, and axillary nodes normal. No abnormal inguinal nodes palpated Neurologic: Grossly normal  Pelvic: External genitalia:   Hypopigmentation of the vulva.              Urethra:  normal appearing urethra with no masses, tenderness or lesions              Bartholins and Skenes: normal                 Vagina: normal appearing vagina with normal color and discharge, no lesions              Cervix: no lesions              Pap taken: Yes.   Bimanual Exam:  Uterus:   Absent.              Adnexa: no mass, fullness, tenderness              Rectal exam: No..  Confirms.              Anus:  normal sphincter tone, no lesions  Chaperone was present for exam.  Assessment:   Well woman visit with normal exam. Had supracervical hysterectomy 01/01/15. Piece of cervix was inadvertently left at time of hysterectomy.  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 Smoker. Vulvar lesion.  I think this may be the cause of the vaginal/vulvar irritation.   Possible lichen sclerosus.   Plan: Mammogram screening discussed. Recommended self breast awareness. Pap and HR HPV as above. Guidelines for Calcium, Vitamin D, regular exercise program including cardiovascular and weight bearing exercise. Return for vulvar biopsy.  Procedure explained. Discussed weight loss.    Follow up annually and prn.   After visit summary provided.

## 2016-11-06 ENCOUNTER — Encounter: Payer: Self-pay | Admitting: Obstetrics and Gynecology

## 2016-11-06 ENCOUNTER — Ambulatory Visit (INDEPENDENT_AMBULATORY_CARE_PROVIDER_SITE_OTHER): Payer: Managed Care, Other (non HMO) | Admitting: Obstetrics and Gynecology

## 2016-11-06 VITALS — BP 124/80 | HR 76 | Resp 14 | Ht 64.5 in | Wt 171.0 lb

## 2016-11-06 DIAGNOSIS — N9089 Other specified noninflammatory disorders of vulva and perineum: Secondary | ICD-10-CM | POA: Diagnosis not present

## 2016-11-06 DIAGNOSIS — Z01411 Encounter for gynecological examination (general) (routine) with abnormal findings: Secondary | ICD-10-CM

## 2016-11-06 NOTE — Patient Instructions (Addendum)
EXERCISE AND DIET:  We recommended that you start or continue a regular exercise program for good health. Regular exercise means any activity that makes your heart beat faster and makes you sweat.  We recommend exercising at least 30 minutes per day at least 3 days a week, preferably 4 or 5.  We also recommend a diet low in fat and sugar.  Inactivity, poor dietary choices and obesity can cause diabetes, heart attack, stroke, and kidney damage, among others.    ALCOHOL AND SMOKING:  Women should limit their alcohol intake to no more than 7 drinks/beers/glasses of wine (combined, not each!) per week. Moderation of alcohol intake to this level decreases your risk of breast cancer and liver damage. And of course, no recreational drugs are part of a healthy lifestyle.  And absolutely no smoking or even second hand smoke. Most people know smoking can cause heart and lung diseases, but did you know it also contributes to weakening of your bones? Aging of your skin?  Yellowing of your teeth and nails?  CALCIUM AND VITAMIN D:  Adequate intake of calcium and Vitamin D are recommended.  The recommendations for exact amounts of these supplements seem to change often, but generally speaking 600 mg of calcium (either carbonate or citrate) and 800 units of Vitamin D per day seems prudent. Certain women may benefit from higher intake of Vitamin D.  If you are among these women, your doctor will have told you during your visit.    PAP SMEARS:  Pap smears, to check for cervical cancer or precancers,  have traditionally been done yearly, although recent scientific advances have shown that most women can have pap smears less often.  However, every woman still should have a physical exam from her gynecologist every year. It will include a breast check, inspection of the vulva and vagina to check for abnormal growths or skin changes, a visual exam of the cervix, and then an exam to evaluate the size and shape of the uterus and  ovaries.  And after 49 years of age, a rectal exam is indicated to check for rectal cancers. We will also provide age appropriate advice regarding health maintenance, like when you should have certain vaccines, screening for sexually transmitted diseases, bone density testing, colonoscopy, mammograms, etc.   MAMMOGRAMS:  All women over 49 years old should have a yearly mammogram. Many facilities now offer a "3D" mammogram, which may cost around $50 extra out of pocket. If possible,  we recommend you accept the option to have the 3D mammogram performed.  It both reduces the number of women who will be called back for extra views which then turn out to be normal, and it is better than the routine mammogram at detecting truly abnormal areas.    COLONOSCOPY:  Colonoscopy to screen for colon cancer is recommended for all women at age 49.  We know, you hate the idea of the prep.  We agree, BUT, having colon cancer and not knowing it is worse!!  Colon cancer so often starts as a polyp that can be seen and removed at colonscopy, which can quite literally save your life!  And if your first colonoscopy is normal and you have no family history of colon cancer, most women don't have to have it again for 10 years.  Once every ten years, you can do something that may end up saving your life, right?  We will be happy to help you get it scheduled when you are ready.    Be sure to check your insurance coverage so you understand how much it will cost.  It may be covered as a preventative service at no cost, but you should check your particular policy.     Lichen Sclerosus Introduction Lichen sclerosus is a skin problem. It can happen on any part of the body, but it commonly involves the anal or genital areas. It can cause itching and discomfort in these areas. Treatment can help to control symptoms. When the genital area is affected, getting treatment is important because the condition can cause scarring that may lead to other  problems. What are the causes? The cause of this condition is not known. It could be the result of an overactive immune system or a lack of certain hormones. Lichen sclerosus is not an infection or a fungus. It is not passed from one person to another (not contagious). What increases the risk? This condition is more likely to develop in women, usually after menopause. What are the signs or symptoms? Symptoms of this condition include:  Thin, wrinkled, white areas on the skin.  Thickened white areas on the skin.  Red and swollen patches (lesions) on the skin.  Tears or cracks in the skin.  Bruising.  Blood blisters.  Severe itching. You may also have pain, itching, or burning with urination. Constipation is also common in people with lichen sclerosus. How is this diagnosed? This condition may be diagnosed with a physical exam. In some cases, a tissue sample (biopsy sample) may be removed to be looked at under a microscope. How is this treated? This condition is usually treated with medicated creams or ointments (topical steroids) that are applied over the affected areas. Follow these instructions at home:  Take over-the-counter and prescription medicines only as told by your health care provider.  Use creams or ointments as told by your health care provider.  Do not scratch the affected areas of skin.  Women should keep the vaginal area as clean and dry as possible.  Keep all follow-up visits as told by your health care provider. This is important. Contact a health care provider if:  You have increasing redness, swelling, or pain in the affected area.  You have fluid, blood, or pus coming from the affected area.  You have new lesions on your skin.  You have a fever.  You have pain during sex. This information is not intended to replace advice given to you by your health care provider. Make sure you discuss any questions you have with your health care provider. Document  Released: 03/25/2011 Document Revised: 04/09/2016 Document Reviewed: 01/28/2015  2017 Elsevier

## 2016-11-10 ENCOUNTER — Encounter: Payer: Self-pay | Admitting: Obstetrics and Gynecology

## 2016-11-11 LAB — IPS PAP TEST WITH HPV

## 2016-11-16 DIAGNOSIS — N904 Leukoplakia of vulva: Secondary | ICD-10-CM

## 2016-11-16 HISTORY — DX: Leukoplakia of vulva: N90.4

## 2016-11-22 NOTE — Progress Notes (Deleted)
   Subjective:    Patient ID: [name], female    DOB: February 24, 1966, 51 y.o.   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  10/12: adjuvant I-131 rx [facility]. 11/12: post-therapy scan: pos at right neck, nodes vs remnant 2/13: TG=2.3 (ab neg) 6/13 TG=0.7 (ab not done due to lab error) 2/14: TG undetectable (ab neg).  3/15: TG=undetectable (ab neg). 3/16: TG=0.1 (ab neg). Postsurgical hypothyroidism: pt has gained a few lbs.  She takes synthroid as rx'ed.   Review of Systems     Objective:   Physical Exam VITAL SIGNS:  See vs page GENERAL: no distress Neck: a healed scar is present.  i do not appreciate a nodule in the thyroid or elsewhere in the neck       Assessment & Plan:

## 2016-11-26 ENCOUNTER — Ambulatory Visit: Payer: Managed Care, Other (non HMO) | Admitting: Endocrinology

## 2016-12-11 ENCOUNTER — Ambulatory Visit: Payer: Managed Care, Other (non HMO) | Admitting: Endocrinology

## 2016-12-26 ENCOUNTER — Other Ambulatory Visit: Payer: Self-pay | Admitting: Internal Medicine

## 2016-12-28 NOTE — Telephone Encounter (Signed)
[place] controlled substance database checked.  Ok to fill medication. rx printed 

## 2017-01-11 ENCOUNTER — Telehealth: Payer: Self-pay | Admitting: Obstetrics and Gynecology

## 2017-01-11 NOTE — Telephone Encounter (Addendum)
Patient is ready to schedule her biopsy. Patient would the beginning of April early am if possible.

## 2017-01-11 NOTE — Telephone Encounter (Signed)
Spoke with patient. Patient would like to schedule vulvar biopsy at this time. Requesting an early morning appointment in April. Offered 10 am appointments to patient, but patient states this will need to be done earlier due to her schedule. Appointment scheduled for 02/17/2017 at 9 am with Dr.Silva. Patient is agreeable to date and time. Order already placed for precert.  Routing to provider for final review. Patient agreeable to disposition. Will close encounter.

## 2017-01-19 ENCOUNTER — Telehealth: Payer: Self-pay | Admitting: Obstetrics and Gynecology

## 2017-01-19 NOTE — Telephone Encounter (Signed)
Spoke with patient. Appointment for vulvar biopsy moved to 02/17/2017 at 3 pm with Dr.Silva. Patient is agreeable to date and time.  Routing to provider for final review. Patient agreeable to disposition. Will close encounter.

## 2017-01-19 NOTE — Telephone Encounter (Signed)
Pt needs to reschedule her biopsy that is scheduled for 02/17/2017.  She needs an afternoon appointment.

## 2017-01-31 ENCOUNTER — Other Ambulatory Visit: Payer: Self-pay | Admitting: Endocrinology

## 2017-01-31 NOTE — Telephone Encounter (Signed)
Please refill x 1 Ov is due  

## 2017-02-01 ENCOUNTER — Ambulatory Visit (INDEPENDENT_AMBULATORY_CARE_PROVIDER_SITE_OTHER): Payer: BLUE CROSS/BLUE SHIELD | Admitting: Endocrinology

## 2017-02-01 ENCOUNTER — Encounter: Payer: Self-pay | Admitting: Endocrinology

## 2017-02-01 VITALS — BP 132/84 | HR 81 | Ht 64.5 in | Wt 169.0 lb

## 2017-02-01 DIAGNOSIS — C73 Malignant neoplasm of thyroid gland: Secondary | ICD-10-CM | POA: Diagnosis not present

## 2017-02-01 DIAGNOSIS — E890 Postprocedural hypothyroidism: Secondary | ICD-10-CM | POA: Diagnosis not present

## 2017-02-01 DIAGNOSIS — E559 Vitamin D deficiency, unspecified: Secondary | ICD-10-CM | POA: Diagnosis not present

## 2017-02-01 NOTE — Patient Instructions (Signed)
blood tests are requested for you today.  We'll let you know about the results. If now problem is found, Please return in 2 years.

## 2017-02-01 NOTE — Progress Notes (Signed)
Subjective:    Patient ID: [name], female    DOB: 1965-12-22, 51 y.o.   MRN: [id number]  HPI  The state of at least three ongoing medical problems is addressed today, with interval history of each noted here: Pt returns for f/u of stage-1 papillary thyroid cancer: She does not notice any nodule at the anterior neck.   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  10/12: adjuvant I-131 rx [facility]. 11/12: post-therapy scan: pos at right neck, nodes vs remnant.   2/13: TG=2.3 (ab neg) 6/13 TG=0.7 (ab not done due to lab error) 2/14: TG undetectable (ab neg).  3/15: TG=undetectable (ab neg). 3/16: TG=0.1 (ab neg). Postsurgical hypothyroidism: pt denies dry skin.  She takes synthroid as rx'ed.  Vit-D deficiency: she has mild leg cramps.  She does not take a supplement now.   Past Medical History:  Diagnosis Date  . ALLERGIC RHINITIS   . Anemia   . ANXIETY   . DEPRESSION   . Fibroid   . HYPERLIPIDEMIA    diet controlled, no meds  . Hypothyroidism   . Thyroid carcinoma [facility])     Past Surgical History:  Procedure Laterality Date  . ABDOMINAL HYSTERECTOMY N/A 01/01/2015   Procedure:  SUPRACERVICAL HYSTERECTOMY WITH BILATERAL SALPINGECTOMT  Surgeon: Jamey Reas de Berton Lan, MD;  Location: [facility];  Service: Gynecology;  Laterality: N/A;  . BILATERAL SALPINGECTOMY Bilateral 01/01/2015   Procedure: BILATERAL SALPINGECTOMY        ;  Surgeon: Everardo All Amundson de Berton Lan, MD;  Location: [facility];  Service: Gynecology;  Laterality: Bilateral;  . CESAREAN SECTION  2002   x 1  . TOTAL THYROIDECTOMY  2009/2010  . TUBAL LIGATION    . WISDOM TOOTH EXTRACTION      Social History   Social History  . Marital status: Married    Spouse name: N/A  . Number of children: 1  . Years of education: N/A   Occupational History  . customer service    Social History Main Topics  . Smoking status: Current Every Day  Smoker    Packs/day: 0.25    Years: 31.00    Types: Cigarettes  . Smokeless tobacco: Never Used  . Alcohol use 3.6 oz/week    6 Standard drinks or equivalent per week     Comment: 6 beers per week  . Drug use: No  . Sexual activity: Yes    Partners: Male    Birth control/ protection: Surgical     Comment: tubal/TAH   Other Topics Concern  . Not on file   Social History Narrative  . No narrative on file    Current Outpatient Prescriptions on File Prior to Visit  Medication Sig Dispense Refill  . ALPRAZolam (XANAX) 0.5 MG tablet TAKE 1/2 TO 1 TABLET BY MOUTH DAILY AS NEEDED 30 tablet 1  . aspirin 81 MG tablet Take 81 mg by mouth daily.    Marland Kitchen BIOTIN PO Take 1 tablet by mouth daily. Reported on 12/11/2015    . ibuprofen (ADVIL,MOTRIN) 800 MG tablet Take 1 tablet (800 mg total) by mouth every 8 (eight) hours as needed. 60 tablet 0  . levothyroxine (SYNTHROID, LEVOTHROID) 137 MCG tablet TAKE 1 TABLET DAILY BEFORE BREAKFAST 90 tablet 0  . Omega-3 Fatty Acids (FISH OIL PO) Take 1 tablet by mouth daily.     No current facility-administered medications on file prior to visit.  Allergies  Allergen Reactions  . Doxycycline Nausea Only  . Sulfonamide Derivatives Itching    Family History  Problem Relation Age of Onset  . Hypertension Other   . Stroke Other   . Breast cancer Mother     5-6 years ago  . Breast cancer Paternal Grandmother   . Diabetes Father   . Cancer Maternal Grandmother   . Colon cancer Neg Hx     BP 132/84   Pulse 81   Ht 5' 4.5" (1.638 m)   Wt 169 lb (76.7 kg)   LMP 12/09/2014 (Exact Date)   SpO2 97%   BMI 28.56 kg/m    Review of Systems Denies numbness and edema.    Objective:   Physical Exam VITAL SIGNS:  See vs page.  GENERAL: no distress. Neck: a healed scar is present.  i do not appreciate a nodule in the thyroid or elsewhere in the neck.    Lab Results  Component Value Date   TSH 0.73 07/21/2016      Assessment & Plan:    Postsurgical hypothyroidism: well-replaced. Please continue the same medication Differentiated thyroid cancer: due for recheck Vit-D deficiency: due for recheck Patient is advised the following: Patient Instructions  blood tests are requested for you today.  We'll let you know about the results. If now problem is found, Please return in 2 years.

## 2017-02-02 LAB — TSH: TSH: 0.82 u[IU]/mL (ref 0.35–4.50)

## 2017-02-02 LAB — VITAMIN D 25 HYDROXY (VIT D DEFICIENCY, FRACTURES): VITD: 34.78 ng/mL (ref 30.00–100.00)

## 2017-02-02 LAB — THYROGLOBULIN ANTIBODY

## 2017-02-02 LAB — THYROGLOBULIN LEVEL: Thyroglobulin: 0.2 ng/mL — ABNORMAL LOW

## 2017-02-17 ENCOUNTER — Ambulatory Visit: Payer: BLUE CROSS/BLUE SHIELD | Admitting: Obstetrics and Gynecology

## 2017-02-17 ENCOUNTER — Encounter: Payer: Self-pay | Admitting: Obstetrics and Gynecology

## 2017-02-17 ENCOUNTER — Ambulatory Visit (INDEPENDENT_AMBULATORY_CARE_PROVIDER_SITE_OTHER): Payer: BLUE CROSS/BLUE SHIELD | Admitting: Obstetrics and Gynecology

## 2017-02-17 DIAGNOSIS — N9089 Other specified noninflammatory disorders of vulva and perineum: Secondary | ICD-10-CM

## 2017-02-17 NOTE — Patient Instructions (Addendum)
VULVAR BIOPSY POST-PROCEDURE INSTRUCTIONS  1. You may take Ibuprofen, Aleve or Tylenol for pain if needed.    2. You may have a small amount of spotting.  You should wear a mini pad for the next few days.  3. You may use some topical Neosporin ointment if you would like (over the counter is fine).  4. You need to call if you have redness around the biopsy site, if there is any unusual draining, if the bleeding is heavy, or if you are concerned.  5. Shower or bathe as normal  6. We will call you within one week with results or we will discuss the results at your follow-up appointment if needed.   Lichen Sclerosus Lichen sclerosus is a skin problem. It can happen on any part of the body, but it commonly involves the anal or genital areas. It can cause itching and discomfort in these areas. Treatment can help to control symptoms. When the genital area is affected, getting treatment is important because the condition can cause scarring that may lead to other problems. What are the causes? The cause of this condition is not known. It could be the result of an overactive immune system or a lack of certain hormones. Lichen sclerosus is not an infection or a fungus. It is not passed from one person to another (not contagious). What increases the risk? This condition is more likely to develop in women, usually after menopause. What are the signs or symptoms? Symptoms of this condition include:  Thin, wrinkled, white areas on the skin.  Thickened white areas on the skin.  Red and swollen patches (lesions) on the skin.  Tears or cracks in the skin.  Bruising.  Blood blisters.  Severe itching. You may also have pain, itching, or burning with urination. Constipation is also common in people with lichen sclerosus. How is this diagnosed? This condition may be diagnosed with a physical exam. In some cases, a tissue sample (biopsy sample) may be removed to be looked at under a microscope. How  is this treated? This condition is usually treated with medicated creams or ointments (topical steroids) that are applied over the affected areas. Follow these instructions at home:  Take over-the-counter and prescription medicines only as told by your health care provider.  Use creams or ointments as told by your health care provider.  Do not scratch the affected areas of skin.  Women should keep the vaginal area as clean and dry as possible.  Keep all follow-up visits as told by your health care provider. This is important. Contact a health care provider if:  You have increasing redness, swelling, or pain in the affected area.  You have fluid, blood, or pus coming from the affected area.  You have new lesions on your skin.  You have a fever.  You have pain during sex. This information is not intended to replace advice given to you by your health care provider. Make sure you discuss any questions you have with your health care provider. Document Released: 03/25/2011 Document Revised: 04/09/2016 Document Reviewed: 01/28/2015 Elsevier Interactive Patient Education  2017 [employer].

## 2017-02-17 NOTE — Progress Notes (Signed)
Subjective:     Patient ID: [name], female   DOB: 09-Nov-1966, 51 y.o.   MRN: [id number]  HPI  Patient here today for vulvar biopsy due to hypopigmentation of vulva.  Has vulvar itching that comes and goes.   Review of Systems  LMP: Hysterectomy - supracervical 12/2014 Contraception: Tubal/Hyst  Pap History: 11-06-16 Neg:Neg HR HPV Pap:  10-03-15 Neg:Neg HR HPV History of abnormal Pap:  Yes,  Pap 09/28/13 - ASCUS and positive HR HPV, Colpo 10/25/13 - ECC Benign.  Pap 04/11/15 - ASCUS and neg HR HPV.  Pap 10/01/14 - ASCUS, neg HR HPV.     Objective:   Physical Exam  Genitourinary:     Vulvar biopsy.  Consent for procedure.    Left labia majora with excoriations and petechiae. Right and left labia majora with hypopigmentation.  Perineum hypopigmented.  Sterile prep with betadine.  Local 1% lidocaine to left and right labia majora. Lot number [id number], exp 02/21. 3 mm punch bx to left labia majora and to pathology. 3 mm punch bx to right labia majora and to pathology.  AgNO3 placed on each bx site.  Good hemostasis.  Minimal EBL.  No complications.      Assessment:      Vulvar hypopigmentation and left vulva excoriation consistent with lichen sclerosus.    Plan:     Discussion of lichen sclerosus. Anticipated tx with Clobetasol or Valisone ointment. Follow up biopsy results. Instructions and precautions given.      ____15___ minutes face to face time of which over 50% was spent in counseling.   After visit summary to patient.

## 2017-02-19 LAB — IPS OTHER TISSUE BIOPSY

## 2017-02-20 ENCOUNTER — Encounter: Payer: Self-pay | Admitting: Obstetrics and Gynecology

## 2017-02-22 ENCOUNTER — Telehealth: Payer: Self-pay

## 2017-02-22 MED ORDER — BETAMETHASONE VALERATE 0.1 % EX OINT: 1.0000 "application " | TOPICAL_OINTMENT | Freq: Two times a day (BID) | CUTANEOUS | 0 refills | Status: DC

## 2017-02-22 NOTE — Telephone Encounter (Signed)
Spoke with patient. Advised of results and message as seen below from [place]. Patient is agreeable and verbalizes understanding. Rx for Betamethasone valerate 0.1 % ointment apply to bilateral labia and perineum twice daily #45 grams 0RF sent to pharmacy on file. Follow up scheduled for 04/07/2017 at 3:30 pm with Dr.Silva. Declines earlier appointment due to work schedule with vacations and needing a late appointment.  Routing to provider for final review. Patient agreeable to disposition. Will close encounter.

## 2017-02-22 NOTE — Telephone Encounter (Signed)
-----   Message from Nunzio Cobbs, MD sent at 02/20/2017  5:05 PM EDT ----- Please inform patient of biopsy results showing lichen sclerosus of both the right and left labia.  This is what we were expecting. I am recommending Betamethasone valerate 0.1% ointment.  Apply to the bilateral labia and perineum twice daily.  Dispense 45 grams, RF none.  Please schedule follow up with me for one month.

## 2017-03-08 ENCOUNTER — Telehealth: Payer: Self-pay | Admitting: Internal Medicine

## 2017-03-08 DIAGNOSIS — R1084 Generalized abdominal pain: Secondary | ICD-10-CM | POA: Diagnosis not present

## 2017-03-08 NOTE — Telephone Encounter (Signed)
Patient Name: [name] DOB: Dec 18, 1965 Initial Comment Caller states she is having pains in her left side, sharp, and over to stomach. She is needing to be triaged. Nurse Assessment Nurse: Renie Ora, RN, Ashby Dawes Date/Time Eilene Ghazi Time): 03/08/2017 12:25:06 PM Confirm and document reason for call. If symptomatic, describe symptoms. ---Caller states that she woke up around 2 am with sharp stomach pains. States it has been intermittent ever since. Tried Gas-x twice already but not helping. Does the patient have any new or worsening symptoms? ---Yes Will a triage be completed? ---Yes Related visit to physician within the last 2 weeks? ---No Does the PT have any chronic conditions? (i.e. diabetes, asthma, etc.) ---No Is the patient pregnant or possibly pregnant? (Ask all females between the ages of 54-55) ---No Is this a behavioral health or substance abuse call? ---No Guidelines Guideline Title Affirmed Question Affirmed Notes Abdominal Pain - Female [1] SEVERE pain (e.g., excruciating) AND [2] present > 1 hour Final Disposition User Go to ED Now Renie Ora, RN, Jeanine Referrals GO TO FACILITY REFUSED Disagree/Comply: Disagree Disagree/Comply Reason: Disagree with instructions

## 2017-03-09 ENCOUNTER — Ambulatory Visit: Payer: BLUE CROSS/BLUE SHIELD | Admitting: Nurse Practitioner

## 2017-03-23 ENCOUNTER — Ambulatory Visit (INDEPENDENT_AMBULATORY_CARE_PROVIDER_SITE_OTHER): Payer: BLUE CROSS/BLUE SHIELD | Admitting: Internal Medicine

## 2017-03-23 ENCOUNTER — Other Ambulatory Visit: Payer: Self-pay | Admitting: Obstetrics and Gynecology

## 2017-03-23 ENCOUNTER — Encounter: Payer: Self-pay | Admitting: Internal Medicine

## 2017-03-23 DIAGNOSIS — M545 Low back pain: Secondary | ICD-10-CM

## 2017-03-23 DIAGNOSIS — D171 Benign lipomatous neoplasm of skin and subcutaneous tissue of trunk: Secondary | ICD-10-CM

## 2017-03-23 DIAGNOSIS — F411 Generalized anxiety disorder: Secondary | ICD-10-CM | POA: Diagnosis not present

## 2017-03-23 DIAGNOSIS — Z1231 Encounter for screening mammogram for malignant neoplasm of breast: Secondary | ICD-10-CM

## 2017-03-23 MED ORDER — HYDROCODONE-ACETAMINOPHEN 7.5-325 MG PO TABS: 1.0000 | ORAL_TABLET | Freq: Two times a day (BID) | ORAL | 0 refills | Status: DC | PRN

## 2017-03-23 NOTE — Progress Notes (Signed)
Subjective:    Patient ID: [name], female    DOB: 04-27-1966, 51 y.o.   MRN: [id number]  HPI  Here with new onset nontender swelling area to left side over the ribs, just noticed a few days ago, no fever, trauma, pain pleuritic or otherwise, redness, or drainage. No hx of lipoma.  She has been reading on internet and wondering if she has a serious condition.  Pt denies chest pain, increased sob or doe, wheezing, orthopnea, PND, increased LE swelling, palpitations, dizziness or syncope.  Pt denies new neurological symptoms such as new headache, or facial or extremity weakness or numbness   Pt denies polydipsia, polyuria,  Pt does have acute onset left LBP worse to stand up or bend at waise or twisting, but no bowel or bladder change, fever, wt loss,  worsening LE pain/numbness/weakness, gait change or fall, seemed to start after llifting ciggarettes at work. Past Medical History:  Diagnosis Date  . ALLERGIC RHINITIS   . Anemia   . ANXIETY   . DEPRESSION   . Fibroid   . HYPERLIPIDEMIA    diet controlled, no meds  . Hypothyroidism   . Lichen sclerosus of female genitalia 2018  . Thyroid carcinoma [facility])    Past Surgical History:  Procedure Laterality Date  . ABDOMINAL HYSTERECTOMY N/A 01/01/2015   Procedure:  SUPRACERVICAL HYSTERECTOMY WITH BILATERAL SALPINGECTOMT  Surgeon: Jamey Reas de Berton Lan, MD;  Location: [facility];  Service: Gynecology;  Laterality: N/A;  . BILATERAL SALPINGECTOMY Bilateral 01/01/2015   Procedure: BILATERAL SALPINGECTOMY        ;  Surgeon: Everardo All Amundson de Berton Lan, MD;  Location: [facility];  Service: Gynecology;  Laterality: Bilateral;  . CESAREAN SECTION  2002   x 1  . TOTAL THYROIDECTOMY  2009/2010  . TUBAL LIGATION    . WISDOM TOOTH EXTRACTION      reports that she has been smoking Cigarettes.  She has a 7.75 pack-year smoking history. She has never used smokeless tobacco. She reports that she drinks about 3.6 oz of alcohol per week  . She reports that she does not use drugs. family history includes Breast cancer in her mother and paternal grandmother; Cancer in her maternal grandmother; Diabetes in her father; Hypertension in her other; Stroke in her other. Allergies  Allergen Reactions  . Doxycycline Nausea Only  . Sulfonamide Derivatives Itching   Current Outpatient Prescriptions on File Prior to Visit  Medication Sig Dispense Refill  . ALPRAZolam (XANAX) 0.5 MG tablet TAKE 1/2 TO 1 TABLET BY MOUTH DAILY AS NEEDED 30 tablet 1  . aspirin 81 MG tablet Take 81 mg by mouth daily.    . betamethasone valerate ointment (VALISONE) 0.1 % Apply 1 application topically 2 (two) times daily. To bilateral labia and perineum 45 g 0  . BIOTIN PO Take 1 tablet by mouth daily. Reported on 12/11/2015    . ibuprofen (ADVIL,MOTRIN) 800 MG tablet Take 1 tablet (800 mg total) by mouth every 8 (eight) hours as needed. 60 tablet 0  . levothyroxine (SYNTHROID, LEVOTHROID) 137 MCG tablet TAKE 1 TABLET DAILY BEFORE BREAKFAST 90 tablet 0  . Omega-3 Fatty Acids (FISH OIL PO) Take 1 tablet by mouth daily.     No current facility-administered medications on file prior to visit.     Review of Systems  Constitutional: Negative for other unusual diaphoresis or sweats HENT: Negative for ear discharge or swelling Eyes: Negative for other worsening visual disturbances Respiratory:  Negative for stridor or other swelling  Gastrointestinal: Negative for worsening distension or other blood Genitourinary: Negative for retention or other urinary change Musculoskeletal: Negative for other MSK pain or swelling Skin: Negative for color change or other new lesions Neurological: Negative for worsening tremors and other numbness  Psychiatric/Behavioral: Negative for worsening agitation or other fatigue All other system neg per pt    Objective:   Physical Exam BP 116/82   Pulse 77   Ht 5' 4.5" (1.638 m)   Wt 166 lb (75.3 kg)   LMP 12/09/2014 (Exact Date)    SpO2 99%   BMI 28.05 kg/m  VS noted,  Constitutional: Pt appears in NAD HENT: Head: NCAT.  Right Ear: External ear normal.  Left Ear: External ear normal.  Eyes: . Pupils are equal, round, and reactive to light. Conjunctivae and EOM are normal Nose: without d/c or deformity Neck: Neck supple. Gross normal ROM Cardiovascular: Normal rate and regular rhythm.   Pulmonary/Chest: Effort normal and breath sounds without rales or wheezing.  Abd:  Soft, NT, ND, + BS, no organomegaly Spine nontender but has left lower lumbar paravertebral tender spasm, without rash or other swelling Neurological: Pt is alert. At baseline orientation, motor grossly intact Skin: Skin is warm. No rashes, no LE edema, but has approx 3 cm raised fatty nontender lesion noted subq to the left abd/chest about t10 level at the anterior axillary line Psychiatric: Pt behavior is normal without agitation , 1-2+ nervous No other exam findings  Lab Results  Component Value Date   WBC 9.4 07/21/2016   HGB 14.7 07/21/2016   HCT 42.6 07/21/2016   PLT 346.0 07/21/2016   GLUCOSE 86 07/21/2016   CHOL 238 (H) 07/21/2016   TRIG 138.0 07/21/2016   HDL 58.50 07/21/2016   LDLDIRECT 131.0 03/10/2007   LDLCALC 152 (H) 07/21/2016   ALT 17 07/21/2016   AST 14 07/21/2016   NA 138 07/21/2016   K 4.2 07/21/2016   CL 103 07/21/2016   CREATININE 0.64 07/21/2016   BUN 15 07/21/2016   CO2 29 07/21/2016   TSH 0.82 02/01/2017       Assessment & Plan:

## 2017-03-23 NOTE — Progress Notes (Signed)
Pre visit review using our clinic review tool, if applicable. No additional management support is needed unless otherwise documented below in the visit note. 

## 2017-03-23 NOTE — Patient Instructions (Addendum)
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  Please have the pharmacy call with any other refills you may need.  Please keep your appointments with your specialists as you may have planned

## 2017-03-24 ENCOUNTER — Encounter: Payer: Self-pay | Admitting: Internal Medicine

## 2017-03-24 DIAGNOSIS — D171 Benign lipomatous neoplasm of skin and subcutaneous tissue of trunk: Secondary | ICD-10-CM

## 2017-03-25 DIAGNOSIS — M545 Low back pain, unspecified: Secondary | ICD-10-CM | POA: Insufficient documentation

## 2017-03-25 NOTE — Assessment & Plan Note (Signed)
c/w msk strain, for cont'd advil prn, follow for any worsening neuritic s/s;  to f/u any worsening symptoms or concerns

## 2017-03-25 NOTE — Assessment & Plan Note (Signed)
situationally worse today, tried to reassure, ok to continue current tx

## 2017-03-25 NOTE — Assessment & Plan Note (Signed)
Has area of concern c/w this, gave reassurance, and given "sudden" nature/rapid onset vs just noticed lesion in the shower a few days ago I did mention the possibility of referral to general surgury but not clear this is needed, and she declines and wants to watch for any further change for now

## 2017-04-05 ENCOUNTER — Ambulatory Visit: Payer: BLUE CROSS/BLUE SHIELD

## 2017-04-07 ENCOUNTER — Ambulatory Visit: Payer: BLUE CROSS/BLUE SHIELD | Admitting: Obstetrics and Gynecology

## 2017-04-22 DIAGNOSIS — Z808 Family history of malignant neoplasm of other organs or systems: Secondary | ICD-10-CM | POA: Diagnosis not present

## 2017-04-22 DIAGNOSIS — Z720 Tobacco use: Secondary | ICD-10-CM | POA: Diagnosis not present

## 2017-04-22 DIAGNOSIS — Z85850 Personal history of malignant neoplasm of thyroid: Secondary | ICD-10-CM | POA: Diagnosis not present

## 2017-04-22 DIAGNOSIS — D171 Benign lipomatous neoplasm of skin and subcutaneous tissue of trunk: Secondary | ICD-10-CM | POA: Diagnosis not present

## 2017-05-03 ENCOUNTER — Other Ambulatory Visit: Payer: Self-pay | Admitting: Internal Medicine

## 2017-05-12 ENCOUNTER — Ambulatory Visit
Admission: RE | Admit: 2017-05-12 | Discharge: 2017-05-12 | Disposition: A | Discharge status: Home / Self Care | Payer: BLUE CROSS/BLUE SHIELD | Source: Ambulatory Visit | Attending: Obstetrics and Gynecology | Admitting: Obstetrics and Gynecology

## 2017-05-12 DIAGNOSIS — Z1231 Encounter for screening mammogram for malignant neoplasm of breast: Secondary | ICD-10-CM

## 2017-05-24 ENCOUNTER — Other Ambulatory Visit: Payer: Self-pay | Admitting: Internal Medicine

## 2017-05-24 NOTE — Telephone Encounter (Signed)
This is an [name] patient.  Thanks!   

## 2017-05-24 NOTE — Telephone Encounter (Signed)
Refill submitted. 

## 2017-06-03 ENCOUNTER — Encounter: Payer: Self-pay | Admitting: Obstetrics and Gynecology

## 2017-06-04 ENCOUNTER — Telehealth: Payer: Self-pay | Admitting: *Deleted

## 2017-06-04 NOTE — Telephone Encounter (Signed)
Call to patient regarding My Chart message.  Reports approximately 4 weeks of feeling hot flashes.wakes her up at night but also occurs through out the day. Denies significant changes in concentration or mood.  Denies vaginal changes. Has had hysterectomy but still had ovaries. Advised needs consult with Dr Quincy Simmonds to discuss options, benefits and risks. Last annual Dec 2017. Patient agreeable to apponitment but will need to call back to schedule. Currently with husband at outpatient surgery center.   Routing to provider for final review. Patient agreeable to disposition. Will close encounter.

## 2017-06-04 NOTE — Telephone Encounter (Signed)
My Chart message from patient: Hey Dr Quincy Simmonds...Marland KitchenMarland KitchenOh lort.Marland KitchenMarland KitchenMarland KitchenIm having hot flashes.Marland Kitchen...what can I take...they wake me up in my sleep....can you give me some pills to make me 21 again. I dont like this. :)

## 2017-09-15 ENCOUNTER — Encounter: Payer: Self-pay | Admitting: Internal Medicine

## 2017-09-15 ENCOUNTER — Other Ambulatory Visit (INDEPENDENT_AMBULATORY_CARE_PROVIDER_SITE_OTHER): Payer: BLUE CROSS/BLUE SHIELD

## 2017-09-15 ENCOUNTER — Ambulatory Visit (INDEPENDENT_AMBULATORY_CARE_PROVIDER_SITE_OTHER): Payer: BLUE CROSS/BLUE SHIELD | Admitting: Internal Medicine

## 2017-09-15 VITALS — BP 144/88 | HR 81 | Temp 98.2°F | Ht 64.5 in | Wt 165.0 lb

## 2017-09-15 DIAGNOSIS — Z114 Encounter for screening for human immunodeficiency virus [HIV]: Secondary | ICD-10-CM

## 2017-09-15 DIAGNOSIS — Z0000 Encounter for general adult medical examination without abnormal findings: Secondary | ICD-10-CM

## 2017-09-15 DIAGNOSIS — Z23 Encounter for immunization: Secondary | ICD-10-CM

## 2017-09-15 LAB — CBC WITH DIFFERENTIAL/PLATELET
BASOS ABS: 0.1 10*3/uL (ref 0.0–0.1)
Basophils Relative: 1 % (ref 0.0–3.0)
EOS ABS: 0.1 10*3/uL (ref 0.0–0.7)
Eosinophils Relative: 1.5 % (ref 0.0–5.0)
HCT: 43.6 % (ref 36.0–46.0)
Hemoglobin: 14.8 g/dL (ref 12.0–15.0)
LYMPHS PCT: 41.8 % (ref 12.0–46.0)
Lymphs Abs: 3.4 10*3/uL (ref 0.7–4.0)
MCHC: 33.9 g/dL (ref 30.0–36.0)
MCV: 93.5 fl (ref 78.0–100.0)
MONO ABS: 0.7 10*3/uL (ref 0.1–1.0)
Monocytes Relative: 8.1 % (ref 3.0–12.0)
NEUTROS ABS: 3.9 10*3/uL (ref 1.4–7.7)
NEUTROS PCT: 47.6 % (ref 43.0–77.0)
PLATELETS: 325 10*3/uL (ref 150.0–400.0)
RBC: 4.67 Mil/uL (ref 3.87–5.11)
RDW: 12.5 % (ref 11.5–15.5)
WBC: 8.1 10*3/uL (ref 4.0–10.5)

## 2017-09-15 LAB — BASIC METABOLIC PANEL
BUN: 14 mg/dL (ref 6–23)
CO2: 29 meq/L (ref 19–32)
CREATININE: 0.59 mg/dL (ref 0.40–1.20)
Calcium: 9.3 mg/dL (ref 8.4–10.5)
Chloride: 103 mEq/L (ref 96–112)
GFR: 114.68 mL/min (ref >60.00)
Glucose, Bld: 95 mg/dL (ref 70–99)
Potassium: 3.5 mEq/L (ref 3.5–5.1)
Sodium: 140 mEq/L (ref 135–145)

## 2017-09-15 LAB — HEPATIC FUNCTION PANEL
ALT: 17 U/L (ref 0–35)
AST: 15 U/L (ref 0–37)
Albumin: 4.2 g/dL (ref 3.5–5.2)
Alkaline Phosphatase: 83 U/L (ref 39–117)
BILIRUBIN DIRECT: 0.1 mg/dL (ref 0.0–0.3)
TOTAL PROTEIN: 7.7 g/dL (ref 6.0–8.3)
Total Bilirubin: 0.3 mg/dL (ref 0.2–1.2)

## 2017-09-15 LAB — LIPID PANEL
CHOL/HDL RATIO: 4
CHOLESTEROL: 209 mg/dL — AB (ref 0–200)
HDL: 55.1 mg/dL (ref >39.00)
LDL CALC: 138 mg/dL — AB (ref 0–99)
NONHDL: 153.44
TRIGLYCERIDES: 78 mg/dL (ref 0.0–149.0)
VLDL: 15.6 mg/dL (ref 0.0–40.0)

## 2017-09-15 LAB — TSH: TSH: 0.07 u[IU]/mL — AB (ref 0.35–4.50)

## 2017-09-15 NOTE — Progress Notes (Signed)
Subjective:    Patient ID: [name], female    DOB: [birth year], 51 y.o.   MRN: [id number]  HPI  Here for wellness and f/u;  Overall doing ok;  Pt denies Chest pain, worsening SOB, DOE, wheezing, orthopnea, PND, worsening LE edema, palpitations, dizziness or syncope.  Pt denies neurological change such as new headache, facial or extremity weakness.  Pt denies polydipsia, polyuria, or low sugar symptoms. Pt states overall good compliance with treatment and medications, good tolerability, and has been trying to follow appropriate diet.  Pt denies worsening depressive symptoms, suicidal ideation or panic. No fever, night sweats, wt loss, loss of appetite, or other constitutional symptoms.  Pt states good ability with ADL's, has low fall risk, home safety reviewed and adequate, no other significant changes in hearing or vision, and only occasionally active with exercise.  State she is now with menopause symptoms followed by GYN.  Plans to quit smoking on bday 51yo, has lost several lbs trying to be more active to control any wt gain.  BP at home has been < 140/90 Wt Readings from Last 3 Encounters:  09/15/17 165 lb (74.8 kg)  03/23/17 166 lb (75.3 kg)  02/17/17 169 lb 6.4 oz (76.8 kg)   Past Medical History:  Diagnosis Date  . ALLERGIC RHINITIS   . Anemia   . ANXIETY   . DEPRESSION   . Fibroid   . HYPERLIPIDEMIA    diet controlled, no meds  . Hypothyroidism   . Lichen sclerosus of female genitalia 2018  . Thyroid carcinoma [facility])    Past Surgical History:  Procedure Laterality Date  . ABDOMINAL HYSTERECTOMY N/A 01/01/2015   Procedure:  SUPRACERVICAL HYSTERECTOMY WITH BILATERAL SALPINGECTOMT  Surgeon: Jamey Reas de Berton Lan, MD;  Location: [facility];  Service: Gynecology;  Laterality: N/A;  . BILATERAL SALPINGECTOMY Bilateral 01/01/2015   Procedure: BILATERAL SALPINGECTOMY        ;  Surgeon: Everardo All Amundson de Berton Lan, MD;  Location: [facility] [facility];  Service: Gynecology;   Laterality: Bilateral;  . CESAREAN SECTION  2002   x 1  . TOTAL THYROIDECTOMY  2009/2010  . TUBAL LIGATION    . WISDOM TOOTH EXTRACTION      reports that she has been smoking Cigarettes.  She has a 7.75 pack-year smoking history. She has never used smokeless tobacco. She reports that she drinks about 3.6 oz of alcohol per week . She reports that she does not use drugs. family history includes Breast cancer in her mother and paternal grandmother; Cancer in her maternal grandmother; Diabetes in her father; Hypertension in her other; Stroke in her other. Allergies  Allergen Reactions  . Doxycycline Nausea Only  . Sulfonamide Derivatives Itching   Current Outpatient Prescriptions on File Prior to Visit  Medication Sig Dispense Refill  . ALPRAZolam (XANAX) 0.5 MG tablet TAKE 1/2-1 TABLET BY MOUTH DAILY AS NEEDED 30 tablet 2  . aspirin 81 MG tablet Take 81 mg by mouth daily.    . betamethasone valerate ointment (VALISONE) 0.1 % Apply 1 application topically 2 (two) times daily. To bilateral labia and perineum 45 g 0  . BIOTIN PO Take 1 tablet by mouth daily. Reported on 12/11/2015    . HYDROcodone-acetaminophen (NORCO) 7.5-325 MG tablet Take 1 tablet by mouth 2 (two) times daily as needed for moderate pain. 30 tablet 0  . ibuprofen (ADVIL,MOTRIN) 800 MG tablet Take 1 tablet (800 mg total) by mouth every 8 (eight) hours as  needed. 60 tablet 0  . levothyroxine (SYNTHROID, LEVOTHROID) 137 MCG tablet TAKE 1 TABLET BY MOUTH DAILY BEFORE BREAKFAST 90 tablet 1  . Omega-3 Fatty Acids (FISH OIL PO) Take 1 tablet by mouth daily.     No current facility-administered medications on file prior to visit.     Review of Systems Constitutional: Negative for other unusual diaphoresis, sweats, appetite or weight changes HENT: Negative for other worsening hearing loss, ear pain, facial swelling, mouth sores or neck stiffness.   Eyes: Negative for other worsening pain, redness or other visual disturbance.    Respiratory: Negative for other stridor or swelling Cardiovascular: Negative for other palpitations or other chest pain  Gastrointestinal: Negative for worsening diarrhea or loose stools, blood in stool, distention or other pain Genitourinary: Negative for hematuria, flank pain or other change in urine volume.  Musculoskeletal: Negative for myalgias or other joint swelling.  Skin: Negative for other color change, or other wound or worsening drainage.  Neurological: Negative for other syncope or numbness. Hematological: Negative for other adenopathy or swelling Psychiatric/Behavioral: Negative for hallucinations, other worsening agitation, SI, self-injury, or new decreased concentration No other exam findings    Objective:   Physical Exam BP (!) 144/88   Pulse 81   Temp 98.2 F (36.8 C) (Oral)   Ht 5' 4.5" (1.638 m)   Wt 165 lb (74.8 kg)   LMP 12/09/2014 (Exact Date)   SpO2 99%   BMI 27.88 kg/m  VS noted,  Constitutional: Pt is oriented to person, place, and time. Appears well-developed and well-nourished, in no significant distress and comfortable Head: Normocephalic and atraumatic  Eyes: Conjunctivae and EOM are normal. Pupils are equal, round, and reactive to light Right Ear: External ear normal without discharge Left Ear: External ear normal without discharge Nose: Nose without discharge or deformity Mouth/Throat: Oropharynx is without other ulcerations and moist  Neck: Normal range of motion. Neck supple. No JVD present. No tracheal deviation present or significant neck LA or mass Cardiovascular: Normal rate, regular rhythm, normal heart sounds and intact distal pulses.   Pulmonary/Chest: WOB normal and breath sounds without rales or wheezing  Abdominal: Soft. Bowel sounds are normal. NT. No HSM  Musculoskeletal: Normal range of motion. Exhibits no edema Lymphadenopathy: Has no other cervical adenopathy.  Neurological: Pt is alert and oriented to person, place, and time. Pt  has normal reflexes. No cranial nerve deficit. Motor grossly intact, Gait intact Skin: Skin is warm and dry. No rash noted or new ulcerations Psychiatric:  Has normal mood and affect. Behavior is normal without agitation No other exam findings Lab Results  Component Value Date   WBC 9.4 07/21/2016   HGB 14.7 07/21/2016   HCT 42.6 07/21/2016   PLT 346.0 07/21/2016   GLUCOSE 86 07/21/2016   CHOL 238 (H) 07/21/2016   TRIG 138.0 07/21/2016   HDL 58.50 07/21/2016   LDLDIRECT 131.0 03/10/2007   LDLCALC 152 (H) 07/21/2016   ALT 17 07/21/2016   AST 14 07/21/2016   NA 138 07/21/2016   K 4.2 07/21/2016   CL 103 07/21/2016   CREATININE 0.64 07/21/2016   BUN 15 07/21/2016   CO2 29 07/21/2016   TSH 0.82 02/01/2017      Assessment & Plan:

## 2017-09-15 NOTE — Assessment & Plan Note (Signed)

## 2017-09-15 NOTE — Patient Instructions (Signed)

## 2017-09-16 ENCOUNTER — Encounter: Payer: Self-pay | Admitting: Endocrinology

## 2017-09-16 ENCOUNTER — Telehealth: Payer: Self-pay

## 2017-09-16 ENCOUNTER — Other Ambulatory Visit: Payer: Self-pay | Admitting: Internal Medicine

## 2017-09-16 DIAGNOSIS — E039 Hypothyroidism, unspecified: Secondary | ICD-10-CM

## 2017-09-16 LAB — URINALYSIS, ROUTINE W REFLEX MICROSCOPIC
Bilirubin Urine: NEGATIVE
Hgb urine dipstick: NEGATIVE
Ketones, ur: NEGATIVE
Leukocytes, UA: NEGATIVE
Nitrite: NEGATIVE
PH: 6 (ref 5.0–8.0)
RBC / HPF: NONE SEEN (ref 0–?)
SPECIFIC GRAVITY, URINE: 1.01 (ref 1.000–1.030)
TOTAL PROTEIN, URINE-UPE24: NEGATIVE
URINE GLUCOSE: NEGATIVE
UROBILINOGEN UA: 0.2 (ref 0.0–1.0)

## 2017-09-16 LAB — HIV ANTIBODY (ROUTINE TESTING W REFLEX): HIV 1&2 Ab, 4th Generation: NONREACTIVE

## 2017-09-16 MED ORDER — LEVOTHYROXINE SODIUM 125 MCG PO TABS: 125.0000 ug | ORAL_TABLET | Freq: Every day | ORAL | 3 refills | Status: DC

## 2017-09-16 NOTE — Telephone Encounter (Signed)
Pt has been informed and expressed understanding.  

## 2017-09-16 NOTE — Telephone Encounter (Signed)
-----   Message from Biagio Borg, MD sent at 09/16/2017 12:49 PM EDT ----- Left message on MyChart, pt to cont same tx except\  The test results show that your current treatment is OK, except the TSH is low, which means the levothyroxine is too high.  We should decrease this to 125 mcg per day, then repeat your thyroid blood tests in 4 weeks - just go to the lab.  Kathlyn Leachman to please inform pt, I will do rx and order

## 2017-09-20 ENCOUNTER — Other Ambulatory Visit: Payer: Self-pay

## 2017-09-20 ENCOUNTER — Telehealth: Payer: Self-pay | Admitting: Endocrinology

## 2017-09-20 MED ORDER — LEVOTHYROXINE SODIUM 125 MCG PO TABS: 125.0000 ug | ORAL_TABLET | Freq: Every day | ORAL | 3 refills | Status: DC

## 2017-09-20 NOTE — Telephone Encounter (Signed)
Pharmacy needs a prescription sent to them for Levothyroxine

## 2017-09-20 NOTE — Telephone Encounter (Signed)
Done

## 2017-11-01 ENCOUNTER — Other Ambulatory Visit (INDEPENDENT_AMBULATORY_CARE_PROVIDER_SITE_OTHER): Payer: BLUE CROSS/BLUE SHIELD

## 2017-11-01 DIAGNOSIS — E039 Hypothyroidism, unspecified: Secondary | ICD-10-CM

## 2017-11-01 LAB — TSH: TSH: 0.41 u[IU]/mL (ref 0.35–4.50)

## 2017-11-01 LAB — T4, FREE: FREE T4: 1.17 ng/dL (ref 0.60–1.60)

## 2017-11-24 ENCOUNTER — Ambulatory Visit: Payer: Managed Care, Other (non HMO) | Admitting: Obstetrics and Gynecology

## 2017-12-15 ENCOUNTER — Ambulatory Visit: Payer: BLUE CROSS/BLUE SHIELD | Admitting: Obstetrics and Gynecology

## 2017-12-29 ENCOUNTER — Ambulatory Visit (INDEPENDENT_AMBULATORY_CARE_PROVIDER_SITE_OTHER): Payer: BLUE CROSS/BLUE SHIELD | Admitting: Obstetrics and Gynecology

## 2017-12-29 ENCOUNTER — Other Ambulatory Visit: Payer: Self-pay

## 2017-12-29 ENCOUNTER — Encounter: Payer: Self-pay | Admitting: Obstetrics and Gynecology

## 2017-12-29 ENCOUNTER — Other Ambulatory Visit (HOSPITAL_COMMUNITY)
Admission: RE | Admit: 2017-12-29 | Discharge: 2017-12-29 | Disposition: A | Discharge status: Home / Self Care | Payer: BLUE CROSS/BLUE SHIELD | Source: Ambulatory Visit | Attending: Obstetrics and Gynecology | Admitting: Obstetrics and Gynecology

## 2017-12-29 VITALS — BP 138/78 | HR 84 | Resp 16 | Ht 63.75 in | Wt 168.0 lb

## 2017-12-29 DIAGNOSIS — Z01419 Encounter for gynecological examination (general) (routine) without abnormal findings: Secondary | ICD-10-CM

## 2017-12-29 NOTE — Progress Notes (Signed)
52 y.o. G72P1001 Married Caucasian female here for annual exam.    Having hot flashes.  Hair thinning.  Doing adjustments in her thyroid medication.  Wants to avoid HRT due to mother's hx of breast cancer.  Not needing to use the Valisone often.  Symptoms of itching are very minimal now on her vulva.  Decreased interest in sex.  Husband with health issues.   Some bloating and constipation.  Taking probiotics. Had colonoscopy due to these symptoms.   Tried to quit smoking with patches.  Weight gain. Plans to go to the gym and use the Nicotine patches.   Labs with PCP.   PCP:  Cathlean Cower, MD  Patient's last menstrual period was 12/09/2014 (exact date).           Sexually active: No. -- occasional The current method of family planning is status post hysterectomy -- ovaries remain.    Exercising: No.  The patient does not participate in regular exercise at present. Smoker:  Yes, 8 cigarettes per day  Health Maintenance: Pap: 11/06/16 Pap and HR HPV negative   10-03-15 Neg:Neg HR HPV History of abnormal Pap:  Yes,  Pap 09/28/13 - ASCUS and positive HR HPV, Colpo 10/25/13 - ECC Benign.  Pap 04/11/15 - ASCUS and neg HR HPV.  Pap 10/01/14 - ASCUS, neg HR HPV. MMG: 05/12/17 BIRADS 1 negative/density c Colonoscopy:  04/14/16-3 polyps. Repeat 5 yrs BMD:   n/a  Result  n/a TDaP:  2014 Gardasil:  n/a HIV: 09/15/17 Negative Hep C: never Screening Labs:  PCP   reports that she has been smoking cigarettes.  She has a 7.75 pack-year smoking history. she has never used smokeless tobacco. She reports that she drinks about 3.6 oz of alcohol per week. She reports that she does not use drugs.  Past Medical History:  Diagnosis Date  . ALLERGIC RHINITIS   . Anemia   . ANXIETY   . DEPRESSION   . Fibroid   . HYPERLIPIDEMIA    diet controlled, no meds  . Hypothyroidism   . Lichen sclerosus of female genitalia 2018  . Thyroid carcinoma [facility])     Past Surgical History:  Procedure  Laterality Date  . ABDOMINAL HYSTERECTOMY N/A 01/01/2015   Procedure:  SUPRACERVICAL HYSTERECTOMY WITH BILATERAL SALPINGECTOMT  Surgeon: Jamey Reas de Berton Lan, MD;  Location: [facility];  Service: Gynecology;  Laterality: N/A;  . BILATERAL SALPINGECTOMY Bilateral 01/01/2015   Procedure: BILATERAL SALPINGECTOMY        ;  Surgeon: Everardo All Amundson de Berton Lan, MD;  Location: [place] [facility];  Service: Gynecology;  Laterality: Bilateral;  . CESAREAN SECTION  2002   x 1  . TOTAL THYROIDECTOMY  2009/2010  . TUBAL LIGATION    . WISDOM TOOTH EXTRACTION      Current Outpatient Medications  Medication Sig Dispense Refill  . ALPRAZolam (XANAX) 0.5 MG tablet TAKE 1/2-1 TABLET BY MOUTH DAILY AS NEEDED 30 tablet 2  . aspirin 81 MG tablet Take 81 mg by mouth daily.    . betamethasone valerate ointment (VALISONE) 0.1 % Apply 1 application topically 2 (two) times daily. To bilateral labia and perineum 45 g 0  . BIOTIN PO Take 1 tablet by mouth daily. Reported on 12/11/2015    . ibuprofen (ADVIL,MOTRIN) 800 MG tablet Take 1 tablet (800 mg total) by mouth every 8 (eight) hours as needed. 60 tablet 0  . levothyroxine (SYNTHROID, LEVOTHROID) 125 MCG tablet Take 1 tablet (125 mcg total) daily  by mouth. 90 tablet 3  . Omega-3 Fatty Acids (FISH OIL PO) Take 1 tablet by mouth daily.    Marland Kitchen HYDROcodone-acetaminophen (NORCO) 7.5-325 MG tablet Take 1 tablet by mouth 2 (two) times daily as needed for moderate pain. (Patient not taking: Reported on 12/29/2017) 30 tablet 0   No current facility-administered medications for this visit.     Family History  Problem Relation Age of Onset  . Hypertension Other   . Stroke Other   . Breast cancer Mother        5-6 years ago  . Breast cancer Paternal Grandmother   . Diabetes Father   . Cancer Maternal Grandmother   . Colon cancer Neg Hx     ROS:  Pertinent items are noted in HPI.  Otherwise, a comprehensive ROS was negative.  Exam:   BP 138/78 (BP Location:  Right Arm, Patient Position: Sitting, Cuff Size: Normal)   Pulse 84   Resp 16   Ht 5' 3.75" (1.619 m)   Wt 168 lb (76.2 kg)   LMP 12/09/2014 (Exact Date)   BMI 29.06 kg/m     General appearance: alert, cooperative and appears stated age Head: Normocephalic, without obvious abnormality, atraumatic Neck: no adenopathy, supple, symmetrical, trachea midline and thyroid normal to inspection and palpation Lungs: clear to auscultation bilaterally Breasts: normal appearance, no masses or tenderness, No nipple retraction or dimpling, No nipple discharge or bleeding, No axillary or supraclavicular adenopathy Heart: regular rate and rhythm Abdomen: soft, non-tender; no masses, no organomegaly Extremities: extremities normal, atraumatic, no cyanosis or edema Skin: Skin color, texture, turgor normal. No rashes or lesions Lymph nodes: Cervical, supraclavicular, and axillary nodes normal. No abnormal inguinal nodes palpated Neurologic: Grossly normal  Pelvic: External genitalia:   Hypopigmentation of the vulva and skin breakdown superiorly on right.               Urethra:  normal appearing urethra with no masses, tenderness or lesions              Bartholins and Skenes: normal                 Vagina: normal appearing vagina with normal color and discharge, no lesions              Cervix: no lesions              Pap taken: Yes.   Bimanual Exam:  Uterus:  Absent.              Adnexa: no mass, fullness, tenderness              Rectal exam: Yes.  .  Confirms.              Anus:  normal sphincter tone, no lesions  Chaperone was present for exam.  Assessment:   Well woman visit with normal exam. Had supracervical hysterectomy 01/01/15. Piece of cervix was inadvertently left at time of hysterectomy.  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  Hx recurrent ASCUS paps.  Smoker.  Declines Wellbutrin.  Lichen sclerosus.  FH breast cancer.   Plan: Mammogram screening discussed.  Will do  3D.  Recommended self breast awareness. Pap and HR HPV as above. Guidelines for Calcium, Vitamin D, regular exercise program including cardiovascular and weight bearing exercise. Labs with PCP. I encourage her to use her Valisone bid x 2 weeks prn.  Follow up annually and prn.   After visit summary provided.

## 2017-12-29 NOTE — Patient Instructions (Signed)

## 2017-12-31 LAB — CYTOLOGY - PAP
Diagnosis: NEGATIVE
HPV: NOT DETECTED

## 2018-01-25 DIAGNOSIS — L821 Other seborrheic keratosis: Secondary | ICD-10-CM | POA: Diagnosis not present

## 2018-01-25 DIAGNOSIS — D225 Melanocytic nevi of trunk: Secondary | ICD-10-CM | POA: Diagnosis not present

## 2018-01-25 DIAGNOSIS — L814 Other melanin hyperpigmentation: Secondary | ICD-10-CM | POA: Diagnosis not present

## 2018-01-25 DIAGNOSIS — D1801 Hemangioma of skin and subcutaneous tissue: Secondary | ICD-10-CM | POA: Diagnosis not present

## 2018-01-25 DIAGNOSIS — D485 Neoplasm of uncertain behavior of skin: Secondary | ICD-10-CM | POA: Diagnosis not present

## 2018-03-04 DIAGNOSIS — J324 Chronic pansinusitis: Secondary | ICD-10-CM | POA: Diagnosis not present

## 2018-03-29 ENCOUNTER — Other Ambulatory Visit: Payer: Self-pay | Admitting: Obstetrics and Gynecology

## 2018-03-29 DIAGNOSIS — Z1231 Encounter for screening mammogram for malignant neoplasm of breast: Secondary | ICD-10-CM

## 2018-04-26 ENCOUNTER — Other Ambulatory Visit: Payer: Self-pay | Admitting: Obstetrics and Gynecology

## 2018-04-26 NOTE — Telephone Encounter (Signed)
Medication refill request: Valisone  Last AEX:  12-29-17 BS Next AEX: 01-02-19 Last MMG (if hormonal medication request): 05-12-17 BIRADS 1 negative  Refill authorized: 02-22-17 #[id number], [id number]. Today, please advise.   Medication pended to [facility] in [place] on [address].

## 2018-04-26 NOTE — Telephone Encounter (Signed)
Patient needs refill on betamethasone. Pharmacy is [facility] in [place] on [address].

## 2018-04-27 ENCOUNTER — Encounter: Payer: Self-pay | Admitting: Internal Medicine

## 2018-04-28 MED ORDER — BETAMETHASONE VALERATE 0.1 % EX OINT: TOPICAL_OINTMENT | CUTANEOUS | 1 refills | Status: DC

## 2018-05-12 ENCOUNTER — Encounter: Payer: Self-pay | Admitting: Obstetrics and Gynecology

## 2018-05-13 ENCOUNTER — Ambulatory Visit
Admission: RE | Admit: 2018-05-13 | Discharge: 2018-05-13 | Disposition: A | Discharge status: Home / Self Care | Payer: BLUE CROSS/BLUE SHIELD | Source: Ambulatory Visit | Attending: Obstetrics and Gynecology | Admitting: Obstetrics and Gynecology

## 2018-05-13 DIAGNOSIS — Z1231 Encounter for screening mammogram for malignant neoplasm of breast: Secondary | ICD-10-CM | POA: Diagnosis not present

## 2018-05-23 DIAGNOSIS — Z0279 Encounter for issue of other medical certificate: Secondary | ICD-10-CM

## 2018-05-31 ENCOUNTER — Encounter: Payer: Self-pay | Admitting: Internal Medicine

## 2018-05-31 MED ORDER — ALPRAZOLAM 0.5 MG PO TABS: ORAL_TABLET | ORAL | 0 refills | Status: DC

## 2018-05-31 NOTE — Telephone Encounter (Signed)
sent 

## 2018-05-31 NOTE — Telephone Encounter (Signed)
Last filled 06/22/2017 30#

## 2018-08-01 ENCOUNTER — Other Ambulatory Visit: Payer: Self-pay | Admitting: Endocrinology

## 2018-08-01 NOTE — Telephone Encounter (Signed)
Please refill x 1 Ov is due  

## 2018-08-01 NOTE — Telephone Encounter (Signed)
Last ov 02/01/17 please advise

## 2018-08-30 ENCOUNTER — Encounter: Payer: Self-pay | Admitting: Internal Medicine

## 2018-08-30 DIAGNOSIS — R140 Abdominal distension (gaseous): Secondary | ICD-10-CM

## 2018-08-30 DIAGNOSIS — R109 Unspecified abdominal pain: Secondary | ICD-10-CM

## 2018-08-30 NOTE — Telephone Encounter (Signed)
Ok for sihrron to let pt know- we have referred to Dr Havery Moros for her symptoms to see if she needs EGD

## 2018-09-20 ENCOUNTER — Ambulatory Visit (INDEPENDENT_AMBULATORY_CARE_PROVIDER_SITE_OTHER): Payer: BLUE CROSS/BLUE SHIELD | Admitting: Internal Medicine

## 2018-09-20 ENCOUNTER — Other Ambulatory Visit (INDEPENDENT_AMBULATORY_CARE_PROVIDER_SITE_OTHER): Payer: BLUE CROSS/BLUE SHIELD

## 2018-09-20 ENCOUNTER — Other Ambulatory Visit: Payer: Self-pay | Admitting: Internal Medicine

## 2018-09-20 ENCOUNTER — Encounter: Payer: Self-pay | Admitting: Internal Medicine

## 2018-09-20 VITALS — BP 118/72 | HR 92 | Temp 98.6°F | Ht 63.75 in | Wt 167.0 lb

## 2018-09-20 DIAGNOSIS — E890 Postprocedural hypothyroidism: Secondary | ICD-10-CM

## 2018-09-20 DIAGNOSIS — Z0000 Encounter for general adult medical examination without abnormal findings: Secondary | ICD-10-CM | POA: Diagnosis not present

## 2018-09-20 DIAGNOSIS — C73 Malignant neoplasm of thyroid gland: Secondary | ICD-10-CM | POA: Diagnosis not present

## 2018-09-20 DIAGNOSIS — R220 Localized swelling, mass and lump, head: Secondary | ICD-10-CM

## 2018-09-20 DIAGNOSIS — E785 Hyperlipidemia, unspecified: Secondary | ICD-10-CM

## 2018-09-20 DIAGNOSIS — F17200 Nicotine dependence, unspecified, uncomplicated: Secondary | ICD-10-CM

## 2018-09-20 LAB — URINALYSIS, ROUTINE W REFLEX MICROSCOPIC
BILIRUBIN URINE: NEGATIVE
Hgb urine dipstick: NEGATIVE
KETONES UR: NEGATIVE
LEUKOCYTES UA: NEGATIVE
Nitrite: NEGATIVE
RBC / HPF: NONE SEEN (ref 0–?)
Specific Gravity, Urine: <=1.005 — AB (ref 1.000–1.030)
Total Protein, Urine: NEGATIVE
URINE GLUCOSE: NEGATIVE
UROBILINOGEN UA: 0.2 (ref 0.0–1.0)
pH: 6 (ref 5.0–8.0)

## 2018-09-20 LAB — BASIC METABOLIC PANEL
BUN: 12 mg/dL (ref 6–23)
CALCIUM: 9.2 mg/dL (ref 8.4–10.5)
CO2: 26 mEq/L (ref 19–32)
Chloride: 103 mEq/L (ref 96–112)
Creatinine, Ser: 0.69 mg/dL (ref 0.40–1.20)
GFR: 95.33 mL/min (ref >60.00)
Glucose, Bld: 97 mg/dL (ref 70–99)
POTASSIUM: 4 meq/L (ref 3.5–5.1)
SODIUM: 138 meq/L (ref 135–145)

## 2018-09-20 LAB — CBC WITH DIFFERENTIAL/PLATELET
BASOS PCT: 1.3 % (ref 0.0–3.0)
Basophils Absolute: 0.1 10*3/uL (ref 0.0–0.1)
EOS ABS: 0.2 10*3/uL (ref 0.0–0.7)
EOS PCT: 2.2 % (ref 0.0–5.0)
HCT: 44.6 % (ref 36.0–46.0)
Hemoglobin: 15.2 g/dL — ABNORMAL HIGH (ref 12.0–15.0)
LYMPHS ABS: 4 10*3/uL (ref 0.7–4.0)
Lymphocytes Relative: 44.5 % (ref 12.0–46.0)
MCHC: 34.1 g/dL (ref 30.0–36.0)
MCV: 92.4 fl (ref 78.0–100.0)
MONO ABS: 0.7 10*3/uL (ref 0.1–1.0)
Monocytes Relative: 8.1 % (ref 3.0–12.0)
NEUTROS PCT: 43.9 % (ref 43.0–77.0)
Neutro Abs: 3.9 10*3/uL (ref 1.4–7.7)
Platelets: 326 10*3/uL (ref 150.0–400.0)
RBC: 4.82 Mil/uL (ref 3.87–5.11)
RDW: 12.9 % (ref 11.5–15.5)
WBC: 8.9 10*3/uL (ref 4.0–10.5)

## 2018-09-20 LAB — T4, FREE: Free T4: 1.14 ng/dL (ref 0.60–1.60)

## 2018-09-20 LAB — TSH: TSH: 0.36 u[IU]/mL (ref 0.35–4.50)

## 2018-09-20 LAB — HEPATIC FUNCTION PANEL
ALBUMIN: 4.2 g/dL (ref 3.5–5.2)
ALT: 17 U/L (ref 0–35)
AST: 16 U/L (ref 0–37)
Alkaline Phosphatase: 102 U/L (ref 39–117)
Bilirubin, Direct: 0.1 mg/dL (ref 0.0–0.3)
Total Bilirubin: 0.2 mg/dL (ref 0.2–1.2)
Total Protein: 7.6 g/dL (ref 6.0–8.3)

## 2018-09-20 LAB — LIPID PANEL
CHOL/HDL RATIO: 4
Cholesterol: 216 mg/dL — ABNORMAL HIGH (ref 0–200)
HDL: 55.5 mg/dL (ref >39.00)
LDL Cholesterol: 125 mg/dL — ABNORMAL HIGH (ref 0–99)
NonHDL: 160.57
TRIGLYCERIDES: 179 mg/dL — AB (ref 0.0–149.0)
VLDL: 35.8 mg/dL (ref 0.0–40.0)

## 2018-09-20 MED ORDER — AMOXICILLIN 500 MG PO CAPS: 1000.0000 mg | ORAL_CAPSULE | Freq: Two times a day (BID) | ORAL | 0 refills | Status: DC

## 2018-09-20 MED ORDER — ROSUVASTATIN CALCIUM 10 MG PO TABS: 10.0000 mg | ORAL_TABLET | Freq: Every day | ORAL | 3 refills | Status: DC

## 2018-09-20 NOTE — Patient Instructions (Signed)
Please take all new medication as prescribed - the antibiotic  Please continue all other medications as before, and refills have been done if requested.  Please have the pharmacy call with any other refills you may need.  Please continue your efforts at being more active, low cholesterol diet, and weight control.  You are otherwise up to date with prevention measures today.  Please keep your appointments with your specialists as you may have planned  Please go to the LAB in the Basement (turn left off the elevator) for the tests to be done today  You will be contacted by phone if any changes need to be made immediately.  Otherwise, you will receive a letter about your results with an explanation, but please check with MyChart first.  Please remember to sign up for MyChart if you have not done so, as this will be important to you in the future with finding out test results, communicating by private email, and scheduling acute appointments online when needed.  Please return in 1 year for your yearly visit, or sooner if needed, with Lab testing done 3-5 days before  

## 2018-09-20 NOTE — Progress Notes (Signed)
Subjective:    Patient ID: [name], female    DOB: 1966-06-19, 52 y.o.   MRN: [id number]  HPI   Here for wellness and f/u;  Overall doing ok;  Pt denies Chest pain, worsening SOB, DOE, wheezing, orthopnea, PND, worsening LE edema, palpitations, dizziness or syncope.  Pt denies neurological change such as new headache, facial or extremity weakness.  Pt denies polydipsia, polyuria, or low sugar symptoms. Pt states overall good compliance with treatment and medications, good tolerability, and has been trying to follow appropriate diet.  Pt denies worsening depressive symptoms, suicidal ideation or panic. No fever, night sweats, wt loss, loss of appetite, or other constitutional symptoms.  Pt states good ability with ADL's, has low fall risk, home safety reviewed and adequate, no other significant changes in hearing or vision, and only occasionally active with exercise.  Has some painless afeb right side facial swelling, some better with friends amoxil.  Still smoking, not really ready to quit Past Medical History:  Diagnosis Date  . ALLERGIC RHINITIS   . Anemia   . ANXIETY   . DEPRESSION   . Fibroid   . HYPERLIPIDEMIA    diet controlled, no meds  . Hypothyroidism   . Lichen sclerosus of female genitalia 2018  . Thyroid carcinoma [facility])    Past Surgical History:  Procedure Laterality Date  . ABDOMINAL HYSTERECTOMY N/A 01/01/2015   Procedure:  SUPRACERVICAL HYSTERECTOMY WITH BILATERAL SALPINGECTOMT  Surgeon: Jamey Reas de Berton Lan, MD;  Location: [facility];  Service: Gynecology;  Laterality: N/A;  . BILATERAL SALPINGECTOMY Bilateral 01/01/2015   Procedure: BILATERAL SALPINGECTOMY        ;  Surgeon: Everardo All Amundson de Berton Lan, MD;  Location: [facility];  Service: Gynecology;  Laterality: Bilateral;  . CESAREAN SECTION  2002   x 1  . TOTAL THYROIDECTOMY  2009/2010  . TUBAL LIGATION    . WISDOM TOOTH EXTRACTION      reports that she has been smoking cigarettes. She has  a 7.75 pack-year smoking history. She has never used smokeless tobacco. She reports that she drinks about 6.0 standard drinks of alcohol per week. She reports that she does not use drugs. family history includes Breast cancer in her paternal grandmother; Breast cancer (age of onset: 45) in her mother; Cancer in her maternal grandmother; Diabetes in her father; Hypertension in her other; Stroke in her other. Allergies  Allergen Reactions  . Doxycycline Nausea Only  . Sulfonamide Derivatives Itching   Current Outpatient Medications on File Prior to Visit  Medication Sig Dispense Refill  . ALPRAZolam (XANAX) 0.5 MG tablet TAKE 1/2-1 TABLET BY MOUTH DAILY AS NEEDED 30 tablet 0  . aspirin 81 MG tablet Take 81 mg by mouth daily.    . betamethasone valerate ointment (VALISONE) 0.1 % Apply to vulva twice daily for 2 weeks prn. May use twice weekly for maintenance dose. 45 g 1  . BIOTIN PO Take 1 tablet by mouth daily. Reported on 12/11/2015    . HYDROcodone-acetaminophen (NORCO) 7.5-325 MG tablet Take 1 tablet by mouth 2 (two) times daily as needed for moderate pain. 30 tablet 0  . ibuprofen (ADVIL,MOTRIN) 800 MG tablet Take 1 tablet (800 mg total) by mouth every 8 (eight) hours as needed. 60 tablet 0  . levothyroxine (SYNTHROID, LEVOTHROID) 125 MCG tablet TAKE 1 TABLET BY MOUTH DAILY 90 tablet 0  . Omega-3 Fatty Acids (FISH OIL PO) Take 1 tablet by mouth daily.  No current facility-administered medications on file prior to visit.    Review of Systems Constitutional: Negative for other unusual diaphoresis, sweats, appetite or weight changes HENT: Negative for other worsening hearing loss, ear pain, facial swelling, mouth sores or neck stiffness.   Eyes: Negative for other worsening pain, redness or other visual disturbance.  Respiratory: Negative for other stridor or swelling Cardiovascular: Negative for other palpitations or other chest pain  Gastrointestinal: Negative for worsening diarrhea or  loose stools, blood in stool, distention or other pain Genitourinary: Negative for hematuria, flank pain or other change in urine volume.  Musculoskeletal: Negative for myalgias or other joint swelling.  Skin: Negative for other color change, or other wound or worsening drainage.  Neurological: Negative for other syncope or numbness. Hematological: Negative for other adenopathy or swelling Psychiatric/Behavioral: Negative for hallucinations, other worsening agitation, SI, self-injury, or new decreased concentration All other system neg per pt    Objective:   Physical Exam BP 118/72   Pulse 92   Temp 98.6 F (37 C) (Oral)   Ht 5' 3.75" (1.619 m)   Wt 167 lb (75.8 kg)   LMP 12/09/2014 (Exact Date)   SpO2 96%   BMI 28.89 kg/m  VS noted,  Constitutional: Pt is oriented to person, place, and time. Appears well-developed and well-nourished, in no significant distress and comfortable Head: Normocephalic and atraumatic  Eyes: Conjunctivae and EOM are normal. Pupils are equal, round, and reactive to light Right Ear: External ear normal without discharge Left Ear: External ear normal without discharge Nose: Nose without discharge or deformity Mouth/Throat: Oropharynx is without other ulcerations and moist with right lower jaw tender erythema with assoc right lower cheek swelling but no ulcer Neck: Normal range of motion. Neck supple. No JVD present. No tracheal deviation present or significant neck LA or mass Cardiovascular: Normal rate, regular rhythm, normal heart sounds and intact distal pulses.   Pulmonary/Chest: WOB normal and breath sounds without rales or wheezing  Abdominal: Soft. Bowel sounds are normal. NT. No HSM  Musculoskeletal: Normal range of motion. Exhibits no edema Lymphadenopathy: Has no other cervical adenopathy.  Neurological: Pt is alert and oriented to person, place, and time. Pt has normal reflexes. No cranial nerve deficit. Motor grossly intact, Gait intact Skin:  Skin is warm and dry. No rash noted or new ulcerations Psychiatric:  Has normal mood and affect. Behavior is normal without agitation No other exam findings Lab Results  Component Value Date   WBC 8.9 09/20/2018   HGB 15.2 (H) 09/20/2018   HCT 44.6 09/20/2018   PLT 326.0 09/20/2018   GLUCOSE 97 09/20/2018   CHOL 216 (H) 09/20/2018   TRIG 179.0 (H) 09/20/2018   HDL 55.50 09/20/2018   LDLDIRECT 131.0 03/10/2007   LDLCALC 125 (H) 09/20/2018   ALT 17 09/20/2018   AST 16 09/20/2018   NA 138 09/20/2018   K 4.0 09/20/2018   CL 103 09/20/2018   CREATININE 0.69 09/20/2018   BUN 12 09/20/2018   CO2 26 09/20/2018   TSH 0.36 09/20/2018          Assessment & Plan:

## 2018-09-20 NOTE — Assessment & Plan Note (Signed)
Pt willing to try statin if LDL > 100

## 2018-09-20 NOTE — Assessment & Plan Note (Signed)
Urged to quit 

## 2018-09-20 NOTE — Assessment & Plan Note (Signed)
For f/u Dr Rogelia Rohrer, and Dr Loanne Drilling as planned

## 2018-09-20 NOTE — Assessment & Plan Note (Signed)
Also for Free t4

## 2018-09-21 ENCOUNTER — Telehealth: Payer: Self-pay

## 2018-09-21 ENCOUNTER — Encounter: Payer: Self-pay | Admitting: Internal Medicine

## 2018-09-21 MED ORDER — ROSUVASTATIN CALCIUM 10 MG PO TABS: 10.0000 mg | ORAL_TABLET | Freq: Every day | ORAL | 3 refills | Status: DC

## 2018-09-21 NOTE — Telephone Encounter (Signed)
Pt has viewed results via MyChart  

## 2018-09-21 NOTE — Telephone Encounter (Signed)
-----   Message from Biagio Borg, MD sent at 09/20/2018  5:37 PM EST ----- Left message on MyChart, pt to cont same tx except  The test results show that your current treatment is OK, except the LDL cholesterol is still mildly elevated.  Please start lower dose Crestor 10 mg per day.  I will send the prescription, and you should hear from the office as well.    Bryceton Hantz to please inform pt, I will do rx

## 2018-09-22 ENCOUNTER — Encounter: Payer: Self-pay | Admitting: Internal Medicine

## 2018-09-23 NOTE — Assessment & Plan Note (Signed)

## 2018-09-23 NOTE — Assessment & Plan Note (Signed)
Mild to mod, for antibx course,  to f/u any worsening symptoms or concerns 

## 2018-09-26 ENCOUNTER — Encounter: Payer: Self-pay | Admitting: Internal Medicine

## 2018-09-29 ENCOUNTER — Other Ambulatory Visit: Payer: Self-pay

## 2018-09-29 ENCOUNTER — Emergency Department (HOSPITAL_COMMUNITY): Payer: BLUE CROSS/BLUE SHIELD

## 2018-09-29 ENCOUNTER — Encounter (HOSPITAL_COMMUNITY): Payer: Self-pay | Admitting: Emergency Medicine

## 2018-09-29 ENCOUNTER — Emergency Department (HOSPITAL_COMMUNITY)
Admission: EM | Admit: 2018-09-29 | Discharge: 2018-09-29 | Disposition: A | Discharge status: Home / Self Care | Payer: BLUE CROSS/BLUE SHIELD | Attending: Emergency Medicine | Admitting: Emergency Medicine

## 2018-09-29 ENCOUNTER — Ambulatory Visit: Payer: Self-pay

## 2018-09-29 DIAGNOSIS — Z85850 Personal history of malignant neoplasm of thyroid: Secondary | ICD-10-CM | POA: Insufficient documentation

## 2018-09-29 DIAGNOSIS — E039 Hypothyroidism, unspecified: Secondary | ICD-10-CM | POA: Insufficient documentation

## 2018-09-29 DIAGNOSIS — R079 Chest pain, unspecified: Secondary | ICD-10-CM | POA: Diagnosis not present

## 2018-09-29 DIAGNOSIS — F17210 Nicotine dependence, cigarettes, uncomplicated: Secondary | ICD-10-CM | POA: Insufficient documentation

## 2018-09-29 DIAGNOSIS — Z79899 Other long term (current) drug therapy: Secondary | ICD-10-CM | POA: Diagnosis not present

## 2018-09-29 DIAGNOSIS — R0602 Shortness of breath: Secondary | ICD-10-CM | POA: Insufficient documentation

## 2018-09-29 DIAGNOSIS — Z7982 Long term (current) use of aspirin: Secondary | ICD-10-CM | POA: Diagnosis not present

## 2018-09-29 DIAGNOSIS — R0789 Other chest pain: Secondary | ICD-10-CM | POA: Diagnosis not present

## 2018-09-29 LAB — BASIC METABOLIC PANEL
Anion gap: 9 (ref 5–15)
BUN: 13 mg/dL (ref 6–20)
CALCIUM: 9 mg/dL (ref 8.9–10.3)
CHLORIDE: 103 mmol/L (ref 98–111)
CO2: 27 mmol/L (ref 22–32)
CREATININE: 0.68 mg/dL (ref 0.44–1.00)
GFR calc non Af Amer: >60 mL/min (ref >60)
Glucose, Bld: 88 mg/dL (ref 70–99)
Potassium: 3.6 mmol/L (ref 3.5–5.1)
Sodium: 139 mmol/L (ref 135–145)

## 2018-09-29 LAB — I-STAT TROPONIN, ED: TROPONIN I, POC: 0 ng/mL (ref 0.00–0.08)

## 2018-09-29 LAB — CBC
HEMATOCRIT: 45.3 % (ref 36.0–46.0)
Hemoglobin: 14.9 g/dL (ref 12.0–15.0)
MCH: 30.7 pg (ref 26.0–34.0)
MCHC: 32.9 g/dL (ref 30.0–36.0)
MCV: 93.2 fL (ref 80.0–100.0)
NRBC: 0 % (ref 0.0–0.2)
PLATELETS: 360 10*3/uL (ref 150–400)
RBC: 4.86 MIL/uL (ref 3.87–5.11)
RDW: 12.3 % (ref 11.5–15.5)
WBC: 7.7 10*3/uL (ref 4.0–10.5)

## 2018-09-29 LAB — POCT I-STAT TROPONIN I: TROPONIN I, POC: 0.01 ng/mL (ref 0.00–0.08)

## 2018-09-29 MED ORDER — ALUM & MAG HYDROXIDE-SIMETH 200-200-20 MG/5ML PO SUSP
30.0000 mL | Freq: Once | ORAL | Status: AC
  Administered 2018-09-29: 30 mL via ORAL
  Filled 2018-09-29: qty 30

## 2018-09-29 NOTE — ED Notes (Signed)
Bed: HZ12 Expected date: 09/29/18 Expected time: 1:06 PM Means of arrival:  Comments: Held Triage1

## 2018-09-29 NOTE — Telephone Encounter (Signed)
Pt called with chest pain that started yesterday.  She describes it as always dull but it will hit her as sharp and last a few seconds.She states it is just above her breast area in the center of her chest. She denies that it radiates but states that her neck is sore today. She thinks from sleeping wrong. She experienced pain at least 3 times in the time it took to be transferred to me. Pt is a smoker, she has just been prescribed cholesterol lowering medication.  She has been feeling a little nausea. Her husband thinks her "eyes look sick". Per protocol pt is going to the ED for evaluation. Care advice read to patient. Pt verbalized understanding of all instructions.  Reason for Disposition . Chest pain lasts > 5 minutes (Exceptions: chest pain occurring > 3 days ago and now asymptomatic; same as previously diagnosed heartburn and has accompanying sour taste in mouth)  Answer Assessment - Initial Assessment Questions 1. LOCATION: "Where does it hurt?"        above breast in center of chest 2. RADIATION: "Does the pain go anywhere else?" (e.g., into neck, jaw, arms, back)     Sore neck today 3. ONSET: "When did the chest pain begin?" (Minutes, hours or days)   yesterday 4. PATTERN "Does the pain come and go, or has it been constant since it started?"  "Does it get worse with exertion?"      Comes and goes always there dull Sharp when it hits 5. DURATION: "How long does it last" (e.g., seconds, minutes, hours)     seconds 6. SEVERITY: "How bad is the pain?"  (e.g., Scale 1-10; mild, moderate, or severe)    - MILD (1-3): doesn't interfere with normal activities     - MODERATE (4-7): interferes with normal activities or awakens from sleep    - SEVERE (8-10): excruciating pain, unable to do any normal activities       5 7. CARDIAC RISK FACTORS: "Do you have any history of heart problems or risk factors for heart disease?" (e.g., prior heart attack, angina; high blood pressure, diabetes, being  overweight, high cholesterol, smoking, or strong family history of heart disease)   Cholesterol meds smoker 8. PULMONARY RISK FACTORS: "Do you have any history of lung disease?"  (e.g., blood clots in lung, asthma, emphysema, birth control pills)     no 9. CAUSE: "What do you think is causing the chest pain?"     no 10. OTHER SYMPTOMS: "Do you have any other symptoms?" (e.g., dizziness, nausea, vomiting, sweating, fever, difficulty breathing, cough)       Nausea, hot flashes 11. PREGNANCY: "Is there any chance you are pregnant?" "When was your last menstrual period?"      NA  Protocols used: CHEST PAIN-A-AH

## 2018-09-29 NOTE — ED Notes (Signed)
Pt to xray

## 2018-09-29 NOTE — ED Notes (Signed)
Patient denies pain and is resting comfortably.  

## 2018-09-29 NOTE — ED Provider Notes (Signed)
[facility] DEPT Provider Note   CSN: [id number] Arrival date & time: 09/29/18  1043     History   Chief Complaint Chief Complaint  Patient presents with  . Chest Pain    HPI [name] is a 52 y.o. female.  The history is provided by the patient. No language interpreter was used.  Chest Pain       52 year old female with history of tobacco use, hyperlipidemia, thyroid cancer, depression, presenting for evaluation of chest pain.  Patient report intermittent midsternal chest pain since yesterday afternoon after lunch.  Described pain as a sharp sensation mid chest spread across the chest lasting for a few seconds to minutes and resolved.  This has been intermittent, waxing and waning.  She endorsed mild shortness of breath but the pain is intense.  No report of fever, chills, nausea, vomiting, diarrhea, lightheadedness, dizziness, diaphoresis, back pain, abdominal pain.  She ate a chicken salad for lunch yesterday.  She did reach out to her PCP about her chest pain and was recommended to come to the ER.  She has never had any kind of cardiac work-up in the past.  She denies any prior history of PE DVT, no recent surgery, prolonged bedrest, active cancer or hemoptysis.  She denies any recent strenuous activities or heavy lifting.  She is actively trying to quit smoking tobacco.  She denies any significant alcohol abuse.  No complaint of rash.  Past Medical History:  Diagnosis Date  . ALLERGIC RHINITIS   . Anemia   . ANXIETY   . DEPRESSION   . Fibroid   . HYPERLIPIDEMIA    diet controlled, no meds  . Hypothyroidism   . Lichen sclerosus of female genitalia 2018  . Thyroid carcinoma [facility])     Patient Active Problem List   Diagnosis Date Noted  . Right facial swelling 09/20/2018  . Low back pain 03/25/2017  . Lipoma of abdominal wall 03/23/2017  . Vitamin D deficiency 02/01/2017  . Left ankle pain 09/23/2016  . Chronic constipation  07/21/2016  . Cough 08/13/2015  . Status post abdominal supracervical subtotal hysterectomy 02/10/2015  . Status post total abdominal hysterectomy 01/01/2015  . Elevated blood pressure 12/18/2014  . Smoker 12/18/2014  . Other chest pain 09/10/2014  . Right flank pain 09/05/2014  . Chronic low back pain 06/12/2014  . Malignant neoplasm of thyroid gland ([place]) 09/07/2011  . 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 Preventative health care 03/17/2011  . LUMBAR RADICULOPATHY, RIGHT 08/21/2008  . PARESTHESIA 07/30/2008  . Hyperlipidemia 07/18/2008  . Anxiety state 07/18/2008  . DEPRESSION 07/18/2008  . ALLERGIC RHINITIS 07/18/2008    Past Surgical History:  Procedure Laterality Date  . ABDOMINAL HYSTERECTOMY N/A 01/01/2015   Procedure:  SUPRACERVICAL HYSTERECTOMY WITH BILATERAL SALPINGECTOMT  Surgeon: Jamey Reas de Berton Lan, MD;  Location: [facility];  Service: Gynecology;  Laterality: N/A;  . BILATERAL SALPINGECTOMY Bilateral 01/01/2015   Procedure: BILATERAL SALPINGECTOMY        ;  Surgeon: Everardo All Amundson de Berton Lan, MD;  Location: [facility];  Service: Gynecology;  Laterality: Bilateral;  . CESAREAN SECTION  2002   x 1  . TOTAL THYROIDECTOMY  2009/2010  . TUBAL LIGATION    . WISDOM TOOTH EXTRACTION       OB History    Gravida  1   Para  1   Term  1   Preterm      AB  Living  1     SAB      TAB      Ectopic      Multiple      Live Births  1            Home Medications    Prior to Admission medications   Medication Sig Start Date End Date Taking? Authorizing Provider  ALPRAZolam Duanne Moron) 0.5 MG tablet TAKE 1/2-1 TABLET BY MOUTH DAILY AS NEEDED 05/31/18   Binnie Rail, MD  amoxicillin (AMOXIL) 500 MG capsule Take 2 capsules (1,000 mg total) by mouth 2 (two) times daily. 09/20/18   Biagio Borg, MD  aspirin 81 MG tablet Take 81 mg by mouth daily.    [provider]  betamethasone valerate ointment (VALISONE) 0.1 % Apply to  vulva twice daily for 2 weeks prn. May use twice weekly for maintenance dose. 04/28/18   Nunzio Cobbs, MD  BIOTIN PO Take 1 tablet by mouth daily. Reported on 12/11/2015    [provider]  HYDROcodone-acetaminophen (NORCO) 7.5-325 MG tablet Take 1 tablet by mouth 2 (two) times daily as needed for moderate pain. 03/23/17   Biagio Borg, MD  ibuprofen (ADVIL,MOTRIN) 800 MG tablet Take 1 tablet (800 mg total) by mouth every 8 (eight) hours as needed. 09/23/16   Biagio Borg, MD  levothyroxine (SYNTHROID, LEVOTHROID) 125 MCG tablet TAKE 1 TABLET BY MOUTH DAILY 08/02/18   Renato Shin, MD  Omega-3 Fatty Acids (FISH OIL PO) Take 1 tablet by mouth daily.    [provider]  rosuvastatin (CRESTOR) 10 MG tablet Take 1 tablet (10 mg total) by mouth daily. 09/21/18   Biagio Borg, MD    Family History Family History  Problem Relation Age of Onset  . Hypertension Other   . Stroke Other   . Breast cancer Mother 9  . Breast cancer Paternal Grandmother   . Diabetes Father   . Cancer Maternal Grandmother   . Colon cancer Neg Hx     Social History Social History   Tobacco Use  . Smoking status: Current Every Day Smoker    Packs/day: 0.25    Years: 31.00    Pack years: 7.75    Types: Cigarettes  . Smokeless tobacco: Never Used  Substance Use Topics  . Alcohol use: Yes    Alcohol/week: 6.0 standard drinks    Types: 6 Standard drinks or equivalent per week    Comment: 6 beers per week  . Drug use: No     Allergies   Doxycycline and Sulfonamide derivatives   Review of Systems Review of Systems  Cardiovascular: Positive for chest pain.  All other systems reviewed and are negative.    Physical Exam Updated Vital Signs BP (!) 147/99 (BP Location: Left Arm)   Pulse 78   Temp 98.5 F (36.9 C) (Oral)   Resp (!) 22   Ht 5\' 3"  (1.6 m)   Wt 73.5 kg   LMP 12/09/2014 (Exact Date)   SpO2 96%   BMI 28.70 kg/m   Physical Exam  Constitutional: She appears  well-developed and well-nourished. No distress.  HENT:  Head: Atraumatic.  Eyes: Conjunctivae are normal.  Neck: Neck supple.  Cardiovascular: Normal rate, intact distal pulses and normal pulses.  Pulmonary/Chest: Effort normal and breath sounds normal. She has no decreased breath sounds. She has no wheezes. She has no rales.  Abdominal: Soft. There is no tenderness. There is no guarding.  Musculoskeletal:  Right lower leg: She exhibits no edema.       Left lower leg: She exhibits no edema.  Neurological: She is alert.  Skin: No rash noted.  Psychiatric: She has a normal mood and affect.  Nursing note and vitals reviewed.    ED Treatments / Results  Labs (all labs ordered are listed, but only abnormal results are displayed) Labs Reviewed  BASIC METABOLIC PANEL  CBC  I-STAT TROPONIN, ED  I-STAT TROPONIN, ED  POCT I-STAT TROPONIN I    EKG EKG Interpretation  Date/Time:  Thursday September 29 2018 10:51:22 EST Ventricular Rate:  81 PR Interval:    QRS Duration: 85 QT Interval:  348 QTC Calculation: 404 R Axis:   49 Text Interpretation:  Sinus rhythm Low voltage, precordial leads Confirmed by Virgel Manifold [phone]) on 09/29/2018 3:16:23 PM   Radiology Dg Chest 2 View  Result Date: 09/29/2018 CLINICAL DATA:  Mid chest pain EXAM: CHEST - 2 VIEW COMPARISON:  08/13/2015 FINDINGS: Heart and mediastinal contours are within normal limits. No focal opacities or effusions. No acute bony abnormality. IMPRESSION: No active cardiopulmonary disease. Electronically Signed   By: Rolm Baptise M.D.   On: 09/29/2018 11:20    Procedures Procedures (including critical care time)  Medications Ordered in ED Medications  alum & mag hydroxide-simeth (MAALOX/MYLANTA) 200-200-20 MG/5ML suspension 30 mL (30 mLs Oral Given 09/29/18 1434)     Initial Impression / Assessment and Plan / ED Course  I have reviewed the triage vital signs and the nursing notes.  Pertinent labs & imaging  results that were available during my care of the patient were reviewed by me and considered in my medical decision making (see chart for details).     BP 126/83   Pulse 79   Temp 98.5 F (36.9 C) (Oral)   Resp 16   Ht 5\' 3"  (1.6 m)   Wt 73.5 kg   LMP 12/09/2014 (Exact Date)   SpO2 100%   BMI 28.70 kg/m    Final Clinical Impressions(s) / ED Diagnoses   Final diagnoses:  Atypical chest pain    ED Discharge Orders    None     1:36 PM Patient presents with chest pain atypical of ACS.  She has a heart score of 2, low risk of MACE.  She has low risk of PE based on Wells criteria.  Suspect gastritis causing her sxs.  Work up initiated.  No significant discomfort at this time.   3:26 PM Normal serial troponin.  Patient states her symptoms resolved after GI cocktail.  She is resting comfortably.  She is stable for discharge and will follow-up closely with PCP for further care.  Return precautions discussed.     Domenic Moras, PA-C 09/29/18 1531    Virgel Manifold, MD 09/30/18 [phone]

## 2018-09-29 NOTE — ED Triage Notes (Signed)
Pt started having. chest pain yesterday at work around 1300. Patient took half a B/C last night and went to bed. Patient woke up at 0500 and it started again, it went away and kept coming back.  No heart history. PCP said to come to the ED  EKG done

## 2018-10-05 ENCOUNTER — Ambulatory Visit: Payer: BLUE CROSS/BLUE SHIELD | Admitting: Gastroenterology

## 2018-10-06 ENCOUNTER — Encounter: Payer: Self-pay | Admitting: Gastroenterology

## 2018-10-06 ENCOUNTER — Ambulatory Visit: Payer: BLUE CROSS/BLUE SHIELD | Admitting: Gastroenterology

## 2018-10-06 VITALS — BP 130/88 | HR 80 | Ht 63.0 in | Wt 168.0 lb

## 2018-10-06 DIAGNOSIS — H6123 Impacted cerumen, bilateral: Secondary | ICD-10-CM | POA: Diagnosis not present

## 2018-10-06 DIAGNOSIS — K219 Gastro-esophageal reflux disease without esophagitis: Secondary | ICD-10-CM

## 2018-10-06 DIAGNOSIS — K5900 Constipation, unspecified: Secondary | ICD-10-CM | POA: Diagnosis not present

## 2018-10-06 DIAGNOSIS — J310 Chronic rhinitis: Secondary | ICD-10-CM | POA: Diagnosis not present

## 2018-10-06 MED ORDER — LINACLOTIDE 145 MCG PO CAPS: 145.0000 ug | ORAL_CAPSULE | Freq: Every day | ORAL | 0 refills | Status: DC

## 2018-10-06 MED ORDER — POLYETHYLENE GLYCOL 3350 17 G PO PACK: 17.0000 g | PACK | Freq: Every day | ORAL | 0 refills | Status: DC

## 2018-10-06 MED ORDER — OMEPRAZOLE 20 MG PO CPDR: 20.0000 mg | DELAYED_RELEASE_CAPSULE | Freq: Every day | ORAL | 1 refills | Status: DC

## 2018-10-06 NOTE — Progress Notes (Signed)
HPI :  52 year old female here for a follow-up visit. She has a history of severe constipation. Lately this has been bothering her more frequently. She will have infrequent bowel movements, at times goes up to 2 weeks without having a bowel movement. She denies any straining when she does have a bowel movement. She does have some bloating and discomfort prior to a bowel movement which is relieved with a bowel movement. We had previously discussed using MiraLAX which she just started using more recently which seems to have settled somewhat. She just started taking about once a day. She denies any blood in her stools.  She was otherwise seen in the emergency room last week for chest pain. She had an EKG and chest x-ray and negative troponins, it was not thought that she had a cardiac event. She was given a GI cocktail which improved her symptoms. She does endorse some reflux symptoms bothering her recently. She denies any odynophagia or dysphagia. No nausea or vomiting, appetite is good. She has been using sometimes more recently which she thinks has helped. She's not tried any other antacids.  Colonoscopy 04/14/2016 - normal ileum, 3 small polyps - at least one adenoma, repeat in 5 years   Past Medical History:  Diagnosis Date  . ALLERGIC RHINITIS   . Anemia   . ANXIETY   . DEPRESSION   . Fibroid   . HYPERLIPIDEMIA    diet controlled, no meds  . Hypothyroidism   . Lichen sclerosus of female genitalia 2018  . Thyroid carcinoma [facility])      Past Surgical History:  Procedure Laterality Date  . ABDOMINAL HYSTERECTOMY N/A 01/01/2015   Procedure:  SUPRACERVICAL HYSTERECTOMY WITH BILATERAL SALPINGECTOMT  Surgeon: Jamey Reas de Berton Lan, MD;  Location: [place];  Service: Gynecology;  Laterality: N/A;  . BILATERAL SALPINGECTOMY Bilateral 01/01/2015   Procedure: BILATERAL SALPINGECTOMY        ;  Surgeon: Everardo All Amundson de Berton Lan, MD;  Location: [place];  Service: Gynecology;   Laterality: Bilateral;  . CESAREAN SECTION  2002   x 1  . TOTAL THYROIDECTOMY  2009/2010  . TUBAL LIGATION    . WISDOM TOOTH EXTRACTION     Family History  Problem Relation Age of Onset  . Hypertension Other   . Stroke Other   . Breast cancer Mother 53  . Breast cancer Paternal Grandmother   . Diabetes Father   . Cancer Maternal Grandmother   . Colon cancer Neg Hx    Social History   Tobacco Use  . Smoking status: Current Every Day Smoker    Packs/day: 0.25    Years: 31.00    Pack years: 7.75    Types: Cigarettes  . Smokeless tobacco: Never Used  Substance Use Topics  . Alcohol use: Yes    Alcohol/week: 6.0 standard drinks    Types: 6 Standard drinks or equivalent per week    Comment: 6 beers per week  . Drug use: No   Current Outpatient Medications  Medication Sig Dispense Refill  . ALPRAZolam (XANAX) 0.5 MG tablet TAKE 1/2-1 TABLET BY MOUTH DAILY AS NEEDED (Patient taking differently: Take 0.25 mg by mouth daily as needed for anxiety. ) 30 tablet 0  . amoxicillin (AMOXIL) 875 MG tablet Take 875 mg by mouth 2 (two) times daily.  0  . betamethasone valerate ointment (VALISONE) 0.1 % Apply to vulva twice daily for 2 weeks prn. May use twice weekly for maintenance dose. (  Patient taking differently: Apply 1 application topically daily as needed (inflammation). ) 45 g 1  . BIOTIN PO Take 1 tablet by mouth daily.     Marland Kitchen levothyroxine (SYNTHROID, LEVOTHROID) 125 MCG tablet TAKE 1 TABLET BY MOUTH DAILY (Patient taking differently: Take 125 mcg by mouth daily. ) 90 tablet 0  . Omega-3 Fatty Acids (FISH OIL PO) Take 1 capsule by mouth daily.     . rosuvastatin (CRESTOR) 10 MG tablet Take 1 tablet (10 mg total) by mouth daily. 90 tablet 3   No current facility-administered medications for this visit.    Allergies  Allergen Reactions  . Doxycycline Nausea Only  . Sulfonamide Derivatives Itching     Review of Systems: All systems reviewed and negative except where noted in  HPI.    Dg Chest 2 View  Result Date: 09/29/2018 CLINICAL DATA:  Mid chest pain EXAM: CHEST - 2 VIEW COMPARISON:  08/13/2015 FINDINGS: Heart and mediastinal contours are within normal limits. No focal opacities or effusions. No acute bony abnormality. IMPRESSION: No active cardiopulmonary disease. Electronically Signed   By: Rolm Baptise M.D.   On: 09/29/2018 11:20   Lab Results  Component Value Date   WBC 7.7 09/29/2018   HGB 14.9 09/29/2018   HCT 45.3 09/29/2018   MCV 93.2 09/29/2018   PLT 360 09/29/2018    Lab Results  Component Value Date   CREATININE 0.68 09/29/2018   BUN 13 09/29/2018   NA 139 09/29/2018   K 3.6 09/29/2018   CL 103 09/29/2018   CO2 27 09/29/2018    Lab Results  Component Value Date   ALT 17 09/20/2018   AST 16 09/20/2018   ALKPHOS 102 09/20/2018   BILITOT 0.2 09/20/2018      Physical Exam: BP 130/88 (BP Location: Left Arm, Patient Position: Sitting, Cuff Size: Normal)   Pulse 80   Ht 5\' 3"  (1.6 m) Comment: pt stated height  Wt 168 lb (76.2 kg)   LMP 12/09/2014 (Exact Date)   BMI 29.76 kg/m  Constitutional: Pleasant,well-developed, female in no acute distress. HEENT: Normocephalic and atraumatic. Conjunctivae are normal. No scleral icterus. Neck supple.  Cardiovascular: Normal rate, regular rhythm.  Pulmonary/chest: Effort normal and breath sounds normal. No wheezing, rales or rhonchi. Abdominal: Soft, nondistended, nontender.  There are no masses palpable. No hepatomegaly. Extremities: no edema Lymphadenopathy: No cervical adenopathy noted. Neurological: Alert and oriented to person place and time. Skin: Skin is warm and dry. No rashes noted. Psychiatric: Normal mood and affect. Behavior is normal.   ASSESSMENT AND PLAN: 52 year old female here for reassessment following issues:  Constipation - likely slow transit, we discussed options. We have previously discussed using MiraLAX although she just started using it more recently. If  she wishes to use this recommend she start once to twice daily and titrate up as needed for goal bowel movement once every 2 days or so. I otherwise offered her a trial of Linzess 145 g a day to see if that helps, as she preferred to try taking a capsule rather than MiraLAX initially. We'll see how she does with this over the next few weeks. If she wants a prescription for Linzess she can call and we can fill that for her. If not and she wishes to use Miralax and she can get that OTC. She can follow up for this issue as needed.   GERD - I suspect she likely has reflux that led to her recent episode of chest discomfort. She's had  some ongoing more typical reflux symptoms and symptoms improved with a GI cocktail. Recommend starting omeprazole 20 mg once a day for the next 4 weeks to see how she does. If she reports this works for her she can use omeprazole as needed moving forward. Her symptoms persist despite trial of PPI asked her to contact me for reassessment. She agreed  Pettus Cellar, MD [facility]

## 2018-10-06 NOTE — Patient Instructions (Addendum)
If you are age 51 or older, your body mass index should be between 23-30. Your Body mass index is 29.76 kg/m. If this is out of the aforementioned range listed, please consider follow up with your Primary Care Provider.  If you are age 4 or younger, your body mass index should be between 19-25. Your Body mass index is 29.76 kg/m. If this is out of the aformentioned range listed, please consider follow up with your Primary Care Provider.   We have given you samples of the following medication to take: Linzess 145 mcg: Take one tablet daily.  You can take 2 of the 49mcg tablets let us know if this is helpful.  Use Miralax daily.  We have sent the following medications to your pharmacy for you to pick up at your convenience: Omeprazole 20mg : Take once daily.  Thank you for entrusting me with your care and for choosing [facility], Dr. [place] Cellar

## 2018-11-03 ENCOUNTER — Telehealth: Payer: Self-pay | Admitting: Obstetrics and Gynecology

## 2018-11-03 ENCOUNTER — Encounter: Payer: Self-pay | Admitting: Obstetrics and Gynecology

## 2018-11-03 ENCOUNTER — Encounter: Payer: Self-pay | Admitting: Internal Medicine

## 2018-11-03 NOTE — Telephone Encounter (Signed)
Patient sent the following correspondence through [facility]. Routing to triage to assist patient with request.  Dr Quincy Simmonds I am having some sharp pains shoot thru my left breast here and there do you know what it may be from.

## 2018-11-03 NOTE — Telephone Encounter (Signed)
Spoke with patient. Patient reports intermittent left breast pain the in sharp and shooting into the nipple at times. Started 2 wks ago. Denies nipple d/c, skin changes, fever/chills. Drinks cup and thermos of coffee daily, maybe eaten more chocolate recently. 05/13/18 MMG negative.   Recommended OV for further evaluation, OV scheduled for 12/23 at 3pm with Dr. Quincy Simmonds. Patient declined earlier appt.   Routing to provider for final review. Patient is agreeable to disposition. Will close encounter.

## 2018-11-04 NOTE — Progress Notes (Signed)
GYNECOLOGY  VISIT   HPI: 52 y.o.   Married  Caucasian  female   G1P1001 with Patient's last menstrual period was 12/09/2014 (exact date).   here for left breast pain.    Pain is shooting through her left nipple for 2 weeks.  She does not feel any lump.   Right breast is not tender.   No nipple discharge.   Started Crestor 2 weeks ago.   Had visit to hospital for chest pain.  Normal cardiac enzymes.  Pain resolved with GI cocktail.  She received a recommendation to do a stress test, and this has not been scheduled.   Increased physical activity since July, 2019.  Some increased lifting.  FH of breast cancer in mother.   GYNECOLOGIC HISTORY: Patient's last menstrual period was 12/09/2014 (exact date). Contraception:  Hysterectomy Menopausal hormone therapy:  none Last mammogram:  05/13/18 BIRADS 1 negative/density b Last pap smear:   12/29/17 Pap and HR HPV negative        OB History    Gravida  1   Para  1   Term  1   Preterm      AB      Living  1     SAB      TAB      Ectopic      Multiple      Live Births  1              Patient Active Problem List   Diagnosis Date Noted  . Right facial swelling 09/20/2018  . Low back pain 03/25/2017  . Lipoma of abdominal wall 03/23/2017  . Vitamin D deficiency 02/01/2017  . Left ankle pain 09/23/2016  . Chronic constipation 07/21/2016  . Cough 08/13/2015  . Status post abdominal supracervical subtotal hysterectomy 02/10/2015  . Status post total abdominal hysterectomy 01/01/2015  . Elevated blood pressure 12/18/2014  . Smoker 12/18/2014  . Other chest pain 09/10/2014  . Right flank pain 09/05/2014  . Chronic low back pain 06/12/2014  . Malignant neoplasm of thyroid gland ([place]) 09/07/2011  . 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 Preventative health care 03/17/2011  . LUMBAR RADICULOPATHY, RIGHT 08/21/2008  . PARESTHESIA 07/30/2008  . Hyperlipidemia 07/18/2008  . Anxiety state 07/18/2008  .  DEPRESSION 07/18/2008  . ALLERGIC RHINITIS 07/18/2008    Past Medical History:  Diagnosis Date  . ALLERGIC RHINITIS   . Anemia   . ANXIETY   . DEPRESSION   . Fibroid   . HYPERLIPIDEMIA    diet controlled, no meds  . Hypothyroidism   . Lichen sclerosus of female genitalia 2018  . Thyroid carcinoma [facility])     Past Surgical History:  Procedure Laterality Date  . ABDOMINAL HYSTERECTOMY N/A 01/01/2015   Procedure:  SUPRACERVICAL HYSTERECTOMY WITH BILATERAL SALPINGECTOMT  Surgeon: Jamey Reas de Berton Lan, MD;  Location: [place] [facility];  Service: Gynecology;  Laterality: N/A;  . BILATERAL SALPINGECTOMY Bilateral 01/01/2015   Procedure: BILATERAL SALPINGECTOMY        ;  Surgeon: Everardo All Amundson de Berton Lan, MD;  Location: [place] [facility];  Service: Gynecology;  Laterality: Bilateral;  . CESAREAN SECTION  2002   x 1  . TOTAL THYROIDECTOMY  2009/2010  . TUBAL LIGATION    . WISDOM TOOTH EXTRACTION      Current Outpatient Medications  Medication Sig Dispense Refill  . ALPRAZolam (XANAX) 0.5 MG tablet TAKE 1/2-1 TABLET BY MOUTH DAILY AS NEEDED (Patient taking differently: Take 0.25 mg  by mouth daily as needed for anxiety. ) 30 tablet 0  . betamethasone valerate ointment (VALISONE) 0.1 % Apply to vulva twice daily for 2 weeks prn. May use twice weekly for maintenance dose. (Patient taking differently: Apply 1 application topically daily as needed (inflammation). ) 45 g 1  . BIOTIN PO Take 1 tablet by mouth daily.     Marland Kitchen levothyroxine (SYNTHROID, LEVOTHROID) 125 MCG tablet TAKE 1 TABLET BY MOUTH DAILY (Patient taking differently: Take 125 mcg by mouth daily. ) 90 tablet 0  . linaclotide (LINZESS) 145 MCG CAPS capsule Take 1 capsule (145 mcg total) by mouth daily before breakfast. 12 capsule 0  . Omega-3 Fatty Acids (FISH OIL PO) Take 1 capsule by mouth daily.     Marland Kitchen omeprazole (PRILOSEC) 20 MG capsule Take 1 capsule (20 mg total) by mouth daily. 90 capsule 1  . polyethylene glycol (MIRALAX)  packet Take 17 g by mouth daily. 14 each 0  . rosuvastatin (CRESTOR) 10 MG tablet Take 1 tablet (10 mg total) by mouth daily. 90 tablet 3   No current facility-administered medications for this visit.      ALLERGIES: Doxycycline and Sulfonamide derivatives  Family History  Problem Relation Age of Onset  . Hypertension Other   . Stroke Other   . Breast cancer Mother 42  . Breast cancer Paternal Grandmother   . Diabetes Father   . Cancer Maternal Grandmother   . Colon cancer Neg Hx     Social History   Socioeconomic History  . Marital status: Married    Spouse name: Not on file  . Number of children: 1  . Years of education: Not on file  . Highest education level: Not on file  Occupational History  . Occupation: Therapist, art  Social Needs  . Financial resource strain: Not on file  . Food insecurity:    Worry: Not on file    Inability: Not on file  . Transportation needs:    Medical: Not on file    Non-medical: Not on file  Tobacco Use  . Smoking status: Current Every Day Smoker    Packs/day: 0.25    Years: 31.00    Pack years: 7.75    Types: Cigarettes  . Smokeless tobacco: Never Used  Substance and Sexual Activity  . Alcohol use: Yes    Alcohol/week: 6.0 standard drinks    Types: 6 Standard drinks or equivalent per week    Comment: 6 beers per week  . Drug use: No  . Sexual activity: Not Currently    Partners: Male    Birth control/protection: Surgical    Comment: tubal/TAH  Lifestyle  . Physical activity:    Days per week: Not on file    Minutes per session: Not on file  . Stress: Not on file  Relationships  . Social connections:    Talks on phone: Not on file    Gets together: Not on file    Attends religious service: Not on file    Active member of club or organization: Not on file    Attends meetings of clubs or organizations: Not on file    Relationship status: Not on file  . Intimate partner violence:    Fear of current or ex partner: Not  on file    Emotionally abused: Not on file    Physically abused: Not on file    Forced sexual activity: Not on file  Other Topics Concern  . Not on file  Social  History Narrative  . Not on file    Review of Systems  Constitutional:       Left breast pain  HENT: Negative.   Eyes: Negative.   Respiratory: Negative.   Cardiovascular: Negative.   Gastrointestinal: Negative.   Endocrine: Negative.   Genitourinary: Negative.   Musculoskeletal: Negative.   Skin: Negative.   Allergic/Immunologic: Negative.   Neurological: Negative.   Hematological: Negative.   Psychiatric/Behavioral: Negative.     PHYSICAL EXAMINATION:    BP (!) 158/82 (BP Location: Right Arm, Patient Position: Sitting, Cuff Size: Normal)   Pulse 100   Resp 16   Ht 5\' 3"  (1.6 m)   Wt 165 lb (74.8 kg)   LMP 12/09/2014 (Exact Date)   BMI 29.23 kg/m     General appearance: alert, cooperative and appears stated age  Breasts:  Right - normal appearance, no masses or tenderness, No nipple retraction or dimpling, No nipple discharge or bleeding, No axillary or supraclavicular adenopathy Left - normal appearance, no masses.  Mild tenderness, No nipple retraction or dimpling, No nipple discharge or bleeding, No axillary or supraclavicular adenopathy   Chaperone was present for exam.  ASSESSMENT  Left breast pain.  FH breast cancer.  Recent chest pain.   PLAN  We discussed left breast versus chest pain.  Dx left mammogram and left breast US.  She will follow up with her PCP regarding her cardiac health.   An After Visit Summary was printed and given to the patient.  __15____ minutes face to face time of which over 50% was spent in counseling.

## 2018-11-07 ENCOUNTER — Encounter: Payer: Self-pay | Admitting: Obstetrics and Gynecology

## 2018-11-07 ENCOUNTER — Ambulatory Visit: Payer: BLUE CROSS/BLUE SHIELD | Admitting: Obstetrics and Gynecology

## 2018-11-07 VITALS — BP 158/82 | HR 100 | Resp 16 | Ht 63.0 in | Wt 165.0 lb

## 2018-11-07 DIAGNOSIS — Z803 Family history of malignant neoplasm of breast: Secondary | ICD-10-CM | POA: Diagnosis not present

## 2018-11-07 DIAGNOSIS — N644 Mastodynia: Secondary | ICD-10-CM | POA: Diagnosis not present

## 2018-11-07 MED ORDER — CLOTRIMAZOLE 1 % EX CREA: 1.0000 "application " | TOPICAL_CREAM | Freq: Two times a day (BID) | CUTANEOUS | 0 refills | Status: DC

## 2018-11-07 NOTE — Progress Notes (Signed)
Patient scheduled while in office for left breast Dx MMG and left breast US, if needed, at [facility] on 11/17/18 at 3:20pm. Patient is agreeable to date and time.

## 2018-11-17 ENCOUNTER — Other Ambulatory Visit: Payer: BLUE CROSS/BLUE SHIELD

## 2018-11-22 ENCOUNTER — Ambulatory Visit
Admission: RE | Admit: 2018-11-22 | Discharge: 2018-11-22 | Disposition: A | Discharge status: Home / Self Care | Payer: BLUE CROSS/BLUE SHIELD | Source: Ambulatory Visit | Attending: Obstetrics and Gynecology | Admitting: Obstetrics and Gynecology

## 2018-11-22 DIAGNOSIS — Z803 Family history of malignant neoplasm of breast: Secondary | ICD-10-CM

## 2018-11-22 DIAGNOSIS — N644 Mastodynia: Secondary | ICD-10-CM | POA: Diagnosis not present

## 2018-11-22 DIAGNOSIS — R928 Other abnormal and inconclusive findings on diagnostic imaging of breast: Secondary | ICD-10-CM | POA: Diagnosis not present

## 2018-12-28 ENCOUNTER — Other Ambulatory Visit: Payer: Self-pay | Admitting: Endocrinology

## 2018-12-30 ENCOUNTER — Other Ambulatory Visit: Payer: Self-pay

## 2018-12-30 ENCOUNTER — Telehealth: Payer: Self-pay | Admitting: Endocrinology

## 2018-12-30 MED ORDER — LEVOTHYROXINE SODIUM 125 MCG PO TABS: 125.0000 ug | ORAL_TABLET | Freq: Every day | ORAL | 0 refills | Status: DC

## 2018-12-30 NOTE — Telephone Encounter (Signed)
Medication:    levothyroxine (SYNTHROID, LEVOTHROID) 125 MCG tablet    PHARMACY:  [id number] - [place], [place] - [address] AT [address]   IS THIS A 90 DAY SUPPLY :   IS PATIENT OUT OF MEDICATION: yes  IF NOT; HOW MUCH IS LEFT:   LAST APPOINTMENT DATE: @2 /10/2019  NEXT APPOINTMENT DATE:@3 /08/2019  DO WE HAVE YOUR PERMISSION TO LEAVE A DETAILED MESSAGE:  OTHER COMMENTS:  Patient would like enough to get her to her appt in the beginning of march. And then will be seen for more refills    **Let patient know to contact pharmacy at the end of the day to make sure medication is ready. **  ** Please notify patient to allow 48-72 hours to process**  **Encourage patient to contact the pharmacy for refills or they can request refills through [facility]**

## 2018-12-30 NOTE — Telephone Encounter (Signed)
Refill sent.

## 2019-01-02 ENCOUNTER — Ambulatory Visit: Payer: BLUE CROSS/BLUE SHIELD | Admitting: Obstetrics and Gynecology

## 2019-01-02 ENCOUNTER — Other Ambulatory Visit (HOSPITAL_COMMUNITY)
Admission: RE | Admit: 2019-01-02 | Discharge: 2019-01-02 | Disposition: A | Discharge status: Home / Self Care | Payer: BLUE CROSS/BLUE SHIELD | Source: Ambulatory Visit | Attending: Obstetrics and Gynecology | Admitting: Obstetrics and Gynecology

## 2019-01-02 ENCOUNTER — Other Ambulatory Visit: Payer: Self-pay

## 2019-01-02 ENCOUNTER — Encounter: Payer: Self-pay | Admitting: Obstetrics and Gynecology

## 2019-01-02 VITALS — BP 122/74 | HR 70 | Resp 16 | Ht 64.5 in | Wt 172.0 lb

## 2019-01-02 DIAGNOSIS — Z01419 Encounter for gynecological examination (general) (routine) without abnormal findings: Secondary | ICD-10-CM | POA: Insufficient documentation

## 2019-01-02 NOTE — Progress Notes (Signed)
53 y.o. G71P1001 Married Caucasian female here for annual exam.    Patient states she has a decreased sexual interest.   Using the valisone as needed.  Does not need refill.   Still with rash on her back.  Used antifungal cream, and it persists.  Did not get her stress test done.   Breast symptoms have resolved.   Husband is disabled due to a back injury.  This is causing stress at home. She has 2 jobs.   PCP: Cathlean Cower, MD    Patient's last menstrual period was 12/09/2014 (exact date).           Sexually active: No. female The current method of family planning is status post hysterectomy--ovaries remain.    Exercising: No.  The patient does not participate in regular exercise at present. Smoker:  Yes, smokes 1/4 to 1/2 ppd  Health Maintenance: Pap: 12-29-17 Neg:Neg HR HPV, 11-06-16 Neg:Neg HR HPV History of abnormal Pap:  Yes,  Pap 09/28/13 - ASCUS and positive HR HPV, Colpo12/10/14 -ECC Benign. Pap 04/11/15 - ASCUS and neg HR HPV.  Pap 10/01/14 - ASCUS, neg HR HPV. MMG: 05-13-18 3D Neg/density B/BiRads1--11-22-18 Lt.Diag & [place] for br.pain was Neg/BiRads1 Colonoscopy:  04/14/16-3 polyps. Repeat 5 yrs  BMD:   n/a  Result  n/a TDaP:  2014 Gardasil:   no HIV: 09-15-17 NR Hep C:never Screening Labs:  Hb today: PCP   reports that she has been smoking cigarettes. She has a 7.75 pack-year smoking history. She has never used smokeless tobacco. She reports current alcohol use of about 3.0 standard drinks of alcohol per week. She reports that she does not use drugs.  Past Medical History:  Diagnosis Date  . ALLERGIC RHINITIS   . Anemia   . ANXIETY   . DEPRESSION   . Fibroid   . HYPERLIPIDEMIA    diet controlled, no meds  . Hypothyroidism   . Lichen sclerosus of female genitalia 2018  . Thyroid carcinoma [place])     Past Surgical History:  Procedure Laterality Date  . ABDOMINAL HYSTERECTOMY N/A 01/01/2015   Procedure:  SUPRACERVICAL HYSTERECTOMY WITH BILATERAL SALPINGECTOMT   Surgeon: Jamey Reas de Berton Lan, MD;  Location: [place];  Service: Gynecology;  Laterality: N/A;  . BILATERAL SALPINGECTOMY Bilateral 01/01/2015   Procedure: BILATERAL SALPINGECTOMY        ;  Surgeon: Everardo All Amundson de Berton Lan, MD;  Location: [facility] [facility];  Service: Gynecology;  Laterality: Bilateral;  . CESAREAN SECTION  2002   x 1  . TOTAL THYROIDECTOMY  2009/2010  . TUBAL LIGATION    . WISDOM TOOTH EXTRACTION      Current Outpatient Medications  Medication Sig Dispense Refill  . ALPRAZolam (XANAX) 0.5 MG tablet TAKE 1/2-1 TABLET BY MOUTH DAILY AS NEEDED (Patient taking differently: Take 0.25 mg by mouth daily as needed for anxiety. ) 30 tablet 0  . betamethasone valerate ointment (VALISONE) 0.1 % Apply to vulva twice daily for 2 weeks prn. May use twice weekly for maintenance dose. (Patient taking differently: Apply 1 application topically daily as needed (inflammation). ) 45 g 1  . BIOTIN PO Take 1 tablet by mouth daily.     . clotrimazole (LOTRIMIN) 1 % cream Apply 1 application topically 2 (two) times daily. 30 g 0  . fluticasone (FLONASE) 50 MCG/ACT nasal spray Place 1 spray into both nostrils as needed.    Marland Kitchen levothyroxine (SYNTHROID, LEVOTHROID) 125 MCG tablet Take 1 tablet (125 mcg total)  by mouth daily. 90 tablet 0  . linaclotide (LINZESS) 145 MCG CAPS capsule Take 1 capsule (145 mcg total) by mouth daily before breakfast. 12 capsule 0  . Omega-3 Fatty Acids (FISH OIL PO) Take 1 capsule by mouth daily.     Marland Kitchen omeprazole (PRILOSEC) 20 MG capsule Take 1 capsule (20 mg total) by mouth daily. 90 capsule 1  . polyethylene glycol (MIRALAX) packet Take 17 g by mouth daily. 14 each 0  . rosuvastatin (CRESTOR) 10 MG tablet Take 1 tablet (10 mg total) by mouth daily. 90 tablet 3   No current facility-administered medications for this visit.     Family History  Problem Relation Age of Onset  . Hypertension Other   . Stroke Other   . Breast cancer Mother 64  . Breast  cancer Paternal Grandmother   . Diabetes Father   . Cancer Maternal Grandmother   . Colon cancer Neg Hx     Review of Systems  Constitutional: Positive for unexpected weight change (weight gain).  Gastrointestinal: Positive for constipation.       Bloating  Endocrine: Positive for polydipsia.  All other systems reviewed and are negative.   Exam:   BP 122/74 (BP Location: Right Arm, Patient Position: Sitting, Cuff Size: Normal)   Pulse 70   Resp 16   Ht 5' 4.5" (1.638 m)   Wt 172 lb (78 kg)   LMP 12/09/2014 (Exact Date)   BMI 29.07 kg/m     General appearance: alert, cooperative and appears stated age Head: Normocephalic, without obvious abnormality, atraumatic Neck: no adenopathy, supple, symmetrical, trachea midline and thyroid normal to inspection and palpation Lungs: clear to auscultation bilaterally Breasts: normal appearance, no masses or tenderness, No nipple retraction or dimpling, No nipple discharge or bleeding, No axillary or supraclavicular adenopathy Heart: regular rate and rhythm Abdomen: soft, non-tender; no masses, no organomegaly Extremities: extremities normal, atraumatic, no cyanosis or edema Skin: Skin color, texture, turgor normal.  Hypopigmentation of the skin on back, covering large majority of her back.  Lymph nodes: Cervical, supraclavicular, and axillary nodes normal. No abnormal inguinal nodes palpated Neurologic: Grossly normal  Pelvic: External genitalia:   Hypo and hyperpigmentation.               Urethra:  normal appearing urethra with no masses, tenderness or lesions              Bartholins and Skenes: normal                 Vagina: normal appearing vagina with normal color and discharge, no lesions              Cervix: no lesions              Pap taken: Yes.   Bimanual Exam:  Uterus:  absent              Adnexa: no mass, fullness, tenderness              Rectal exam: Yes.  .  Confirms.              Anus:  normal sphincter tone, no  lesions  Chaperone was present for exam.  Assessment:   Well woman visit with normal exam. Had supracervical hysterectomy 01/01/15. Piece of cervix was inadvertently left at time of hysterectomy. 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  Hx recurrent ASCUS paps.  Smoker.   Lichen sclerosus.  FH breast cancer.  Life  stress.   Plan: Mammogram screening in June 2020. Recommended self breast awareness. Pap and HR HPV as above. Guidelines for Calcium, Vitamin D, regular exercise program including cardiovascular and weight bearing exercise. We discussed stress and how it can impact her health and functioning.  She will ask family to step in more.  I did mention counseling.  She will work on smoking cessation and exercising more.  She will follow up to have her stress test done.   Follow up annually and prn.   After visit summary provided.

## 2019-01-02 NOTE — Patient Instructions (Signed)

## 2019-01-04 LAB — CYTOLOGY - PAP
Adequacy: ABSENT
DIAGNOSIS: NEGATIVE
HPV: NOT DETECTED

## 2019-01-13 DIAGNOSIS — B360 Pityriasis versicolor: Secondary | ICD-10-CM | POA: Diagnosis not present

## 2019-01-24 ENCOUNTER — Ambulatory Visit: Payer: BLUE CROSS/BLUE SHIELD | Admitting: Endocrinology

## 2019-01-24 ENCOUNTER — Encounter: Payer: Self-pay | Admitting: Endocrinology

## 2019-01-24 VITALS — BP 120/80 | HR 82 | Ht 64.5 in | Wt 171.2 lb

## 2019-01-24 DIAGNOSIS — C73 Malignant neoplasm of thyroid gland: Secondary | ICD-10-CM | POA: Diagnosis not present

## 2019-01-24 DIAGNOSIS — E559 Vitamin D deficiency, unspecified: Secondary | ICD-10-CM | POA: Diagnosis not present

## 2019-01-24 LAB — VITAMIN D 25 HYDROXY (VIT D DEFICIENCY, FRACTURES): VITD: 35.03 ng/mL (ref 30.00–100.00)

## 2019-01-24 MED ORDER — LEVOTHYROXINE SODIUM 125 MCG PO TABS: 125.0000 ug | ORAL_TABLET | Freq: Every day | ORAL | 3 refills | Status: DC

## 2019-01-24 NOTE — Progress Notes (Signed)
Subjective:    Patient ID: [name], female    DOB: Dec 12, 1965, 53 y.o.   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  10/12: adjuvant I-131 rx 158 [facility]. 11/12: post-therapy scan: pos at right neck, nodes vs remnant.   2/13: TG=2.3 (ab neg) 6/13 TG=0.7 (ab not done due to lab error) 2/14: TG undetectable (ab neg).  3/15: TG=undetectable (ab neg). 3/16: TG=0.1 (ab neg). 3/18: TG=0.2 (ab neg). She does not notice any nodule at the anterior neck. This is a stable problem. Postsurgical hypothyroidism: She takes synthroid as rx'ed.  This is a stable problem. Vit-D deficiency: she has mild leg cramps, and assoc numbness.  She does not take a supplement now.  This is a stable problem. Past Medical History:  Diagnosis Date  . ALLERGIC RHINITIS   . Anemia   . ANXIETY   . DEPRESSION   . Fibroid   . HYPERLIPIDEMIA    diet controlled, no meds  . Hypothyroidism   . Lichen sclerosus of female genitalia 2018  . Thyroid carcinoma [facility])     Past Surgical History:  Procedure Laterality Date  . ABDOMINAL HYSTERECTOMY N/A 01/01/2015   Procedure:  SUPRACERVICAL HYSTERECTOMY WITH BILATERAL SALPINGECTOMT  Surgeon: Jamey Reas de Berton Lan, MD;  Location: [place] [facility];  Service: Gynecology;  Laterality: N/A;  . BILATERAL SALPINGECTOMY Bilateral 01/01/2015   Procedure: BILATERAL SALPINGECTOMY        ;  Surgeon: Everardo All Amundson de Berton Lan, MD;  Location: [place] [facility];  Service: Gynecology;  Laterality: Bilateral;  . CESAREAN SECTION  2002   x 1  . TOTAL THYROIDECTOMY  2009/2010  . TUBAL LIGATION    . WISDOM TOOTH EXTRACTION      Social History   Socioeconomic History  . Marital status: Married    Spouse name: Not on file  . Number of children: 1  .  Years of education: Not on file  . Highest education level: Not on file  Occupational History  . Occupation: Therapist, art  Social Needs  . Financial resource strain: Not on file  . Food insecurity:    Worry: Not on file    Inability: Not on file  . Transportation needs:    Medical: Not on file    Non-medical: Not on file  Tobacco Use  . Smoking status: Current Every Day Smoker    Packs/day: 0.25    Years: 31.00    Pack years: 7.75    Types: Cigarettes  . Smokeless tobacco: Never Used  Substance and Sexual Activity  . Alcohol use: Yes    Alcohol/week: 3.0 standard drinks    Types: 3 Cans of beer per week    Comment: 6 beers per week  . Drug use: No  . Sexual activity: Not Currently    Partners: Male    Birth control/protection: Surgical    Comment: tubal/TAH  Lifestyle  . Physical activity:    Days per week: Not on file    Minutes per session: Not on file  . Stress: Not on file  Relationships  . Social connections:    Talks on phone: Not on file    Gets together: Not on file    Attends religious service: Not on file  Active member of club or organization: Not on file    Attends meetings of clubs or organizations: Not on file    Relationship status: Not on file  . Intimate partner violence:    Fear of current or ex partner: Not on file    Emotionally abused: Not on file    Physically abused: Not on file    Forced sexual activity: Not on file  Other Topics Concern  . Not on file  Social History Narrative  . Not on file    Current Outpatient Medications on File Prior to Visit  Medication Sig Dispense Refill  . ALPRAZolam (XANAX) 0.5 MG tablet TAKE 1/2-1 TABLET BY MOUTH DAILY AS NEEDED (Patient taking differently: Take 0.25 mg by mouth daily as needed for anxiety. ) 30 tablet 0  . betamethasone valerate ointment (VALISONE) 0.1 % Apply to vulva twice daily for 2 weeks prn. May use twice weekly for maintenance dose. (Patient taking differently: Apply 1  application topically daily as needed (inflammation). ) 45 g 1  . BIOTIN PO Take 1 tablet by mouth daily.     . clotrimazole (LOTRIMIN) 1 % cream Apply 1 application topically 2 (two) times daily. 30 g 0  . fluticasone (FLONASE) 50 MCG/ACT nasal spray Place 1 spray into both nostrils as needed.    . linaclotide (LINZESS) 145 MCG CAPS capsule Take 1 capsule (145 mcg total) by mouth daily before breakfast. 12 capsule 0  . Omega-3 Fatty Acids (FISH OIL PO) Take 1 capsule by mouth daily.     Marland Kitchen omeprazole (PRILOSEC) 20 MG capsule Take 1 capsule (20 mg total) by mouth daily. 90 capsule 1  . polyethylene glycol (MIRALAX) packet Take 17 g by mouth daily. 14 each 0  . rosuvastatin (CRESTOR) 10 MG tablet Take 1 tablet (10 mg total) by mouth daily. 90 tablet 3   No current facility-administered medications on file prior to visit.     Allergies  Allergen Reactions  . Doxycycline Nausea Only  . Sulfonamide Derivatives Itching    Family History  Problem Relation Age of Onset  . Hypertension Other   . Stroke Other   . Breast cancer Mother 58  . Breast cancer Paternal Grandmother   . Diabetes Father   . Cancer Maternal Grandmother   . Colon cancer Neg Hx     BP 120/80 (BP Location: Right Arm, Patient Position: Sitting, Cuff Size: Normal)   Pulse 82   Ht 5' 4.5" (1.638 m)   Wt 171 lb 3.2 oz (77.7 kg)   LMP 12/09/2014 (Exact Date)   SpO2 98%   BMI 28.93 kg/m    Review of Systems She has slight dry skin.  Denies neck swelling or lump.     Objective:   Physical Exam VITAL SIGNS:  See vs page GENERAL: no distress Neck: a healed scar is present.  I do not appreciate a nodule in the thyroid or elsewhere in the neck.     Lab Results  Component Value Date   TSH 0.36 09/20/2018      Assessment & Plan:  PTC: recheck today.  Postsurgical hypothyroidism: well-replaced Vit-D deficiency: recheck today  Patient Instructions  blood tests are requested for you today.  We'll let you know  about the results. If no problem is found, Please return in 2 more years.

## 2019-01-24 NOTE — Patient Instructions (Addendum)
blood tests are requested for you today.  We'll let you know about the results. If no problem is found, Please return in 2 more years.

## 2019-01-25 LAB — THYROGLOBULIN ANTIBODY

## 2019-01-25 LAB — THYROGLOBULIN LEVEL: THYROGLOBULIN: 0.1 ng/mL — AB

## 2019-02-08 ENCOUNTER — Other Ambulatory Visit: Payer: Self-pay | Admitting: Internal Medicine

## 2019-02-08 NOTE — Telephone Encounter (Signed)
Please advise on refill request. This is a pt of Dr. Jenny Reichmann, this was last filled by Dr. Quay Burow 05/31/18

## 2019-02-09 MED ORDER — ALPRAZOLAM 0.5 MG PO TABS: ORAL_TABLET | ORAL | 0 refills | Status: DC

## 2019-02-09 NOTE — Telephone Encounter (Signed)
Done erx 

## 2019-03-09 ENCOUNTER — Encounter: Payer: Self-pay | Admitting: Internal Medicine

## 2019-03-09 DIAGNOSIS — R079 Chest pain, unspecified: Secondary | ICD-10-CM

## 2019-03-31 ENCOUNTER — Telehealth (HOSPITAL_COMMUNITY): Payer: Self-pay

## 2019-03-31 ENCOUNTER — Other Ambulatory Visit: Payer: Self-pay | Admitting: Obstetrics and Gynecology

## 2019-03-31 DIAGNOSIS — Z1231 Encounter for screening mammogram for malignant neoplasm of breast: Secondary | ICD-10-CM

## 2019-03-31 NOTE — Telephone Encounter (Signed)
Encounter complete. 

## 2019-04-03 ENCOUNTER — Encounter: Payer: Self-pay | Admitting: Internal Medicine

## 2019-04-04 MED ORDER — HYDROCODONE-ACETAMINOPHEN 5-325 MG PO TABS: 1.0000 | ORAL_TABLET | Freq: Four times a day (QID) | ORAL | 0 refills | Status: DC | PRN

## 2019-04-04 NOTE — Telephone Encounter (Signed)
Staff to contact pt please - ? Doxy visit this evening?  thanks

## 2019-04-04 NOTE — Telephone Encounter (Signed)
Done erx 

## 2019-04-04 NOTE — Telephone Encounter (Signed)
Patient is going to wait to see how her pain goes before scheduling, she will call back to schedule.

## 2019-04-05 ENCOUNTER — Ambulatory Visit (HOSPITAL_COMMUNITY)
Admission: RE | Admit: 2019-04-05 | Discharge: 2019-04-05 | Disposition: A | Discharge status: Home / Self Care | Payer: BLUE CROSS/BLUE SHIELD | Source: Ambulatory Visit | Attending: Cardiology | Admitting: Cardiology

## 2019-04-05 ENCOUNTER — Other Ambulatory Visit: Payer: Self-pay

## 2019-04-05 DIAGNOSIS — R079 Chest pain, unspecified: Secondary | ICD-10-CM | POA: Diagnosis not present

## 2019-04-05 LAB — MYOCARDIAL PERFUSION IMAGING
LV dias vol: 79 mL (ref 46–106)
LV sys vol: 29 mL
Peak HR: 109 {beats}/min
Rest HR: 67 {beats}/min
SDS: 0
SRS: 0
SSS: 0
TID: 1.15

## 2019-04-05 MED ORDER — REGADENOSON 0.4 MG/5ML IV SOLN
0.4000 mg | Freq: Once | INTRAVENOUS | Status: AC
  Administered 2019-04-05: 0.4 mg via INTRAVENOUS

## 2019-04-05 MED ORDER — TECHNETIUM TC 99M TETROFOSMIN IV KIT
26.6000 | PACK | Freq: Once | INTRAVENOUS | Status: AC | PRN
  Administered 2019-04-05: 26.6 via INTRAVENOUS
  Filled 2019-04-05: qty 27

## 2019-04-05 MED ORDER — TECHNETIUM TC 99M TETROFOSMIN IV KIT
8.4000 | PACK | Freq: Once | INTRAVENOUS | Status: AC | PRN
  Administered 2019-04-05: 8.4 via INTRAVENOUS
  Filled 2019-04-05: qty 9

## 2019-05-22 ENCOUNTER — Ambulatory Visit
Admission: RE | Admit: 2019-05-22 | Discharge: 2019-05-22 | Disposition: A | Discharge status: Home / Self Care | Payer: BC Managed Care – PPO | Source: Ambulatory Visit | Attending: Obstetrics and Gynecology | Admitting: Obstetrics and Gynecology

## 2019-05-22 ENCOUNTER — Other Ambulatory Visit: Payer: Self-pay | Admitting: Internal Medicine

## 2019-05-22 DIAGNOSIS — Z1231 Encounter for screening mammogram for malignant neoplasm of breast: Secondary | ICD-10-CM | POA: Diagnosis not present

## 2019-05-23 MED ORDER — HYDROCODONE-ACETAMINOPHEN 5-325 MG PO TABS: 1.0000 | ORAL_TABLET | Freq: Four times a day (QID) | ORAL | 0 refills | Status: DC | PRN

## 2019-05-23 NOTE — Telephone Encounter (Signed)
Done erx 

## 2019-06-12 ENCOUNTER — Other Ambulatory Visit: Payer: Self-pay

## 2019-06-12 MED ORDER — OMEPRAZOLE 20 MG PO CPDR: 20.0000 mg | DELAYED_RELEASE_CAPSULE | Freq: Every day | ORAL | 1 refills | Status: DC

## 2019-06-28 ENCOUNTER — Encounter: Payer: Self-pay | Admitting: Internal Medicine

## 2019-06-28 MED ORDER — HYDROCODONE-ACETAMINOPHEN 5-325 MG PO TABS: 1.0000 | ORAL_TABLET | Freq: Four times a day (QID) | ORAL | 0 refills | Status: DC | PRN

## 2019-06-28 NOTE — Telephone Encounter (Signed)
Done erx 

## 2019-07-11 DIAGNOSIS — D2272 Melanocytic nevi of left lower limb, including hip: Secondary | ICD-10-CM | POA: Diagnosis not present

## 2019-07-11 DIAGNOSIS — D1801 Hemangioma of skin and subcutaneous tissue: Secondary | ICD-10-CM | POA: Diagnosis not present

## 2019-07-11 DIAGNOSIS — L814 Other melanin hyperpigmentation: Secondary | ICD-10-CM | POA: Diagnosis not present

## 2019-07-11 DIAGNOSIS — D225 Melanocytic nevi of trunk: Secondary | ICD-10-CM | POA: Diagnosis not present

## 2019-07-19 ENCOUNTER — Encounter: Payer: Self-pay | Admitting: Obstetrics and Gynecology

## 2019-07-20 ENCOUNTER — Other Ambulatory Visit: Payer: Self-pay | Admitting: Obstetrics and Gynecology

## 2019-07-20 MED ORDER — BETAMETHASONE VALERATE 0.1 % EX OINT: TOPICAL_OINTMENT | CUTANEOUS | 1 refills | Status: DC

## 2019-07-20 NOTE — Telephone Encounter (Signed)
Patient sent the following message through [facility]. Routing to triage to assist patient with request.  [name], [facility]  Phone Number: [phone]        Hey Dr Quincy Simmonds hope your doing well can you get me another refill on the betamethasone valerate oinment please send to [facility] [address]. Thank you mam

## 2019-07-20 NOTE — Telephone Encounter (Signed)
Medication refill request: Betamethasone Last AEX:  01/02/19 BS Next AEX: 01/24/20 Last MMG (if hormonal medication request): 05/22/19 BIRADS 1 negative/density b Refill authorized: Please advise on refill if appropriate

## 2019-07-26 ENCOUNTER — Other Ambulatory Visit: Payer: Self-pay | Admitting: Internal Medicine

## 2019-07-27 MED ORDER — HYDROCODONE-ACETAMINOPHEN 5-325 MG PO TABS: 1.0000 | ORAL_TABLET | Freq: Four times a day (QID) | ORAL | 0 refills | Status: DC | PRN

## 2019-07-27 NOTE — Telephone Encounter (Signed)
Done erx 

## 2019-08-23 ENCOUNTER — Encounter: Payer: Self-pay | Admitting: Internal Medicine

## 2019-08-23 MED ORDER — HYDROCODONE-ACETAMINOPHEN 5-325 MG PO TABS: 1.0000 | ORAL_TABLET | Freq: Four times a day (QID) | ORAL | 0 refills | Status: DC | PRN

## 2019-09-01 IMAGING — CR DG CHEST 2V
2 series · 2 of 2 positions shown · non-contrast
Comparison: 08/13/2015

CLINICAL DATA: Mid chest pain

EXAM:
CHEST - 2 VIEW

[w chest pa]
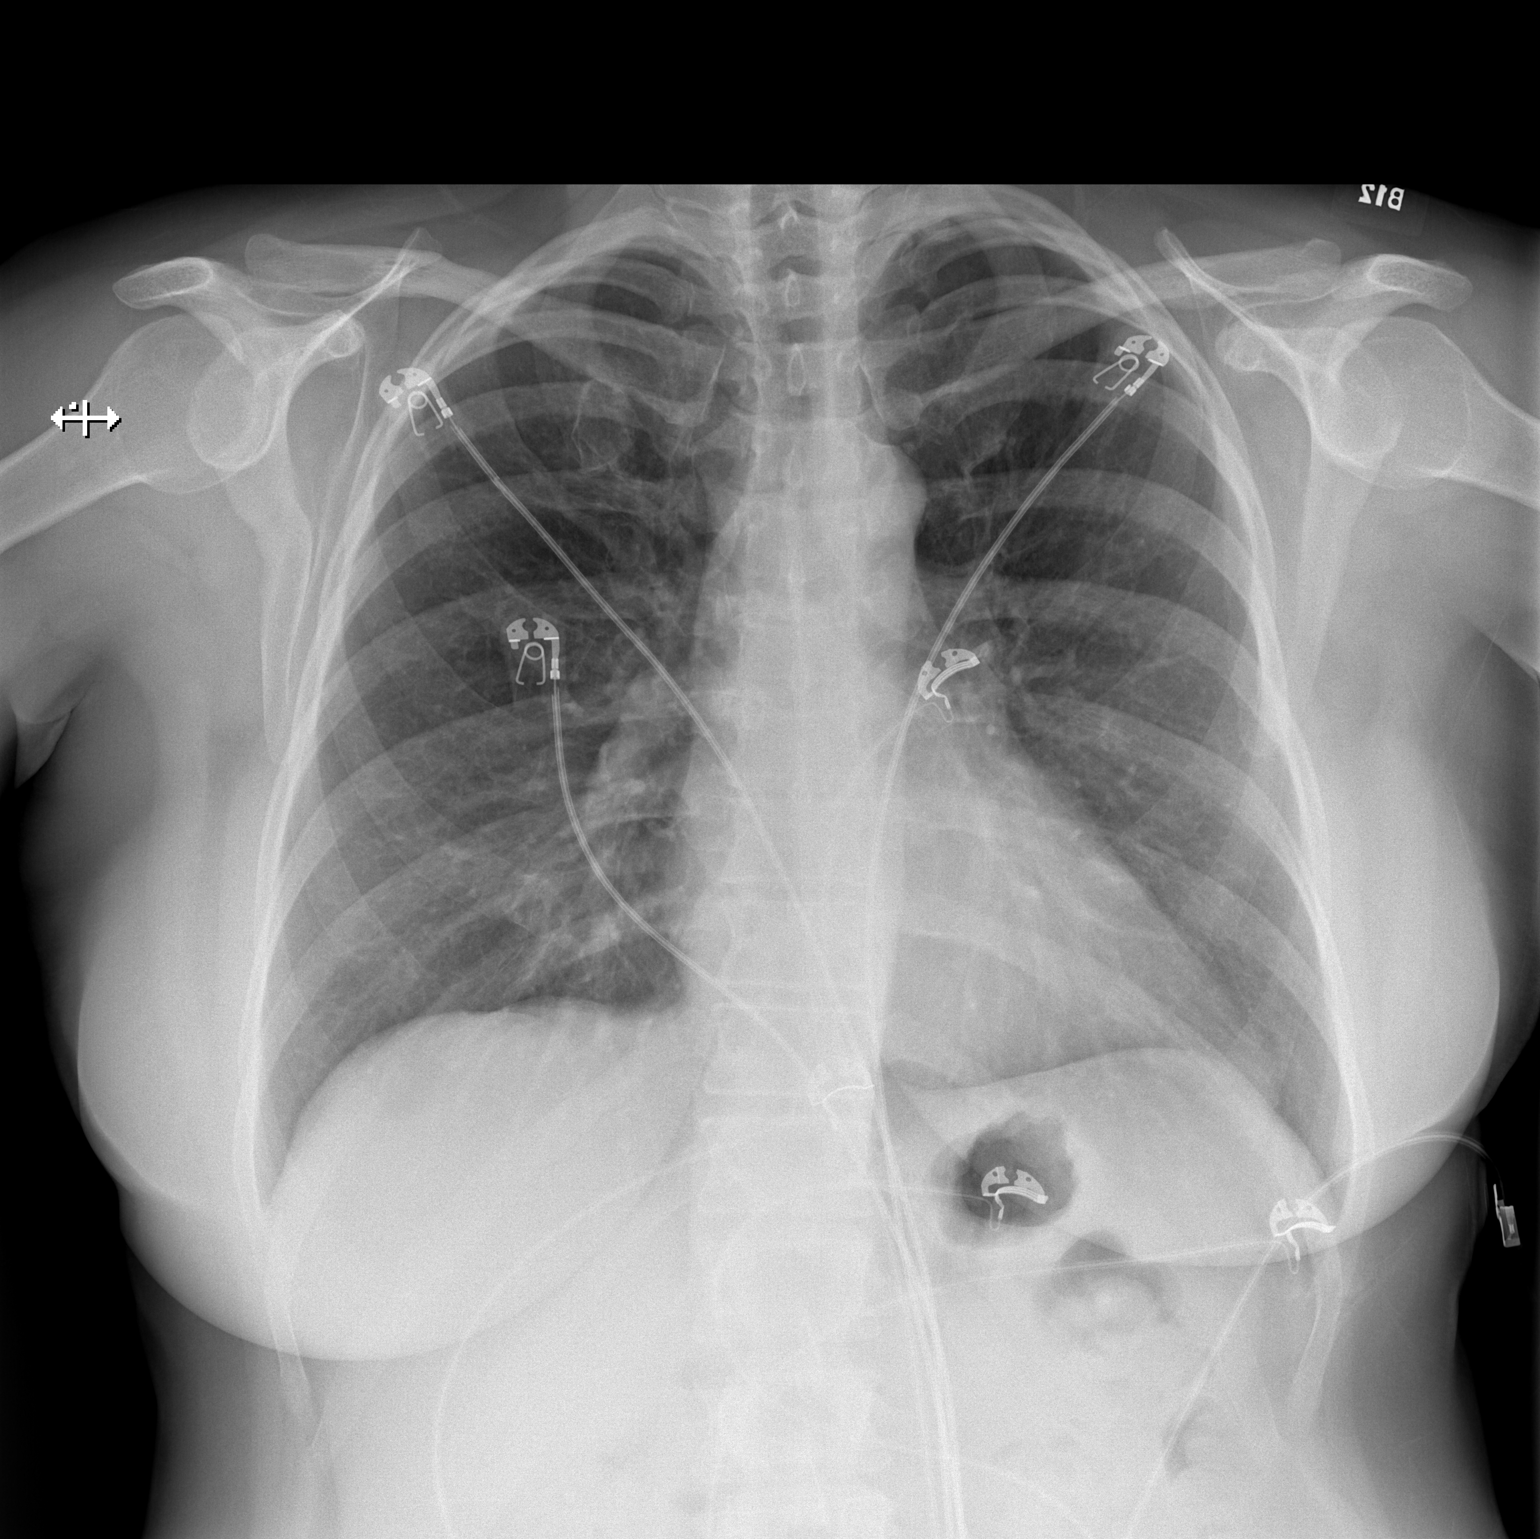

[w chest lat]
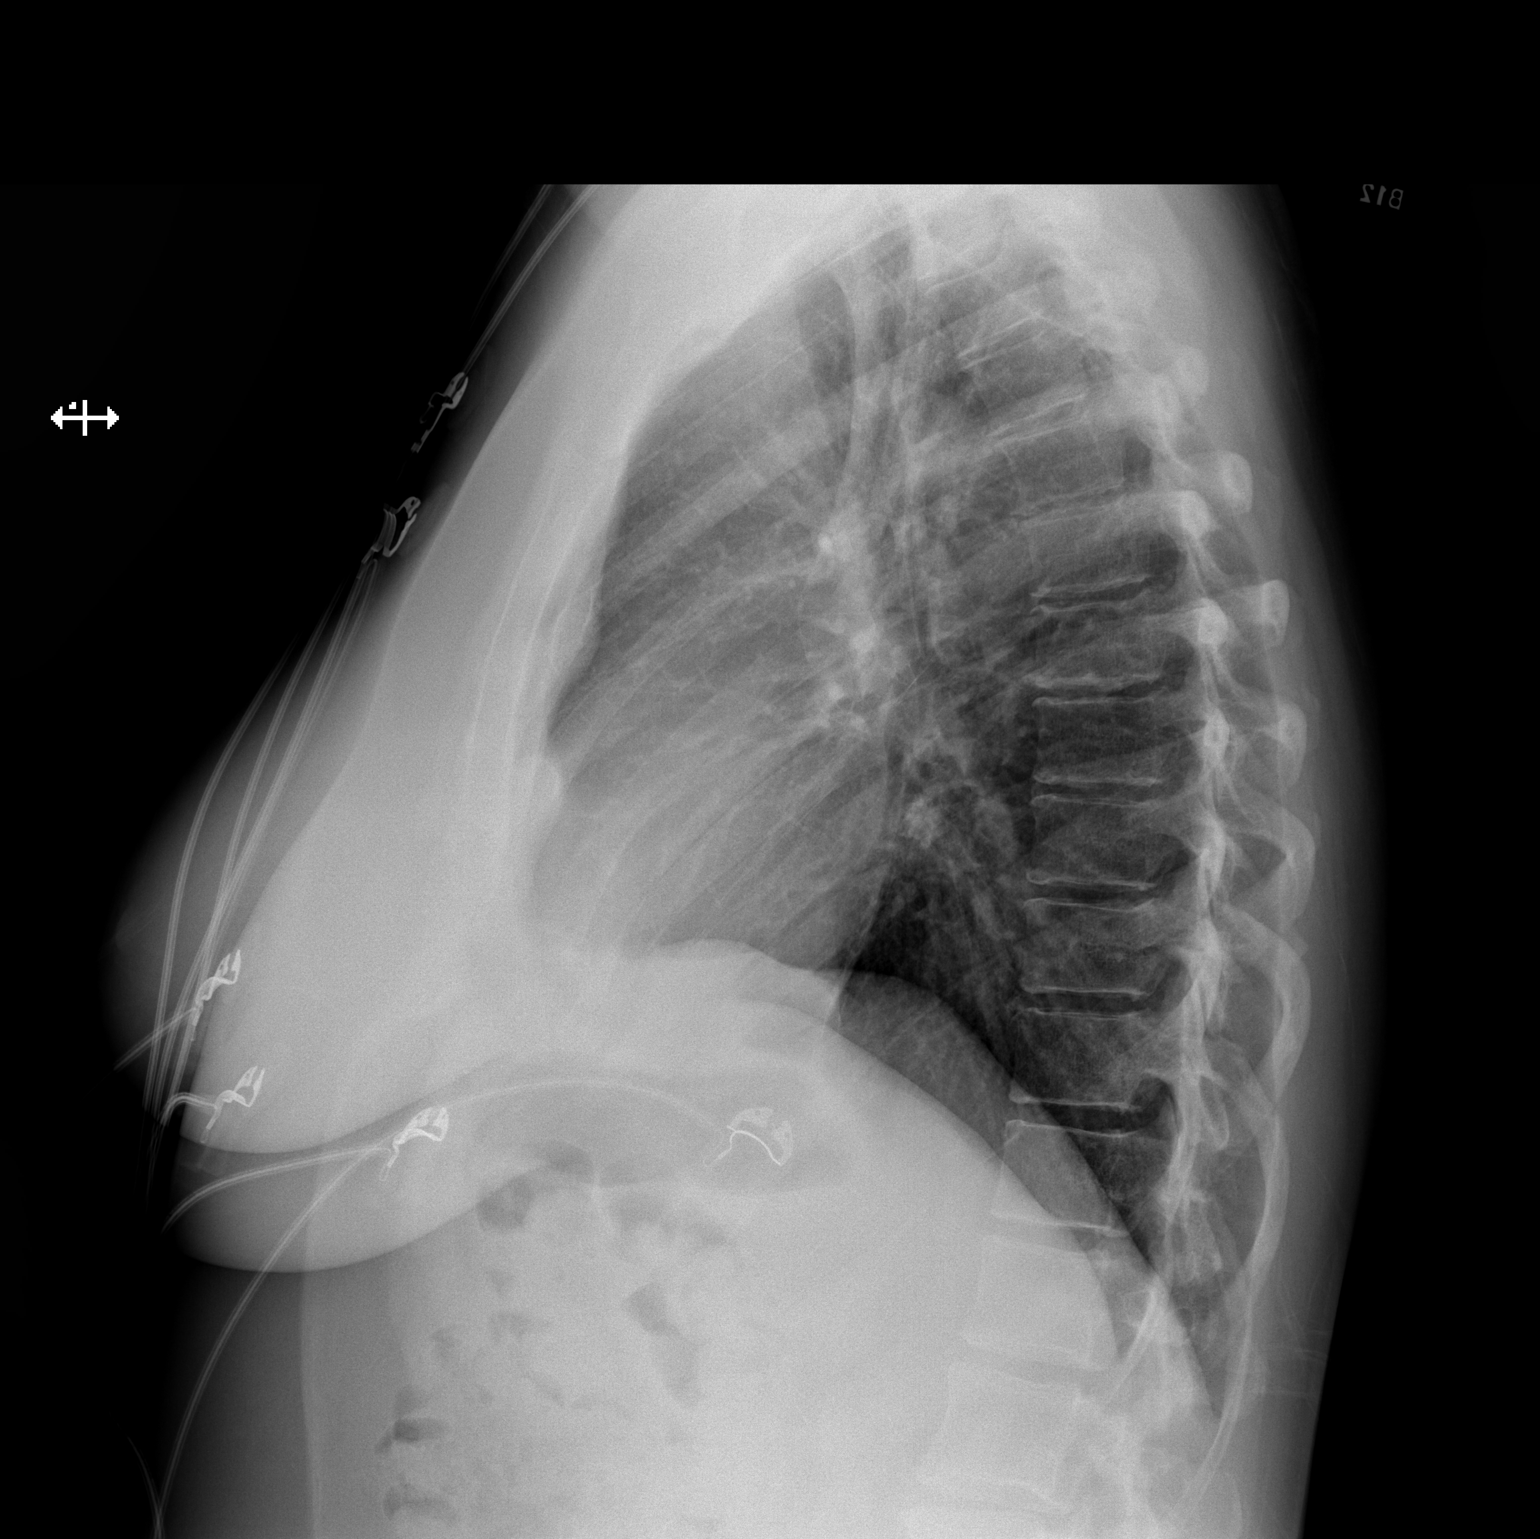

[2 of 2 positions shown; findings below may reference images not displayed]

FINDINGS: Heart and mediastinal contours are within normal limits. No focal
opacities or effusions. No acute bony abnormality.
IMPRESSION: No active cardiopulmonary disease.

## 2019-09-08 ENCOUNTER — Other Ambulatory Visit: Payer: Self-pay | Admitting: *Deleted

## 2019-09-08 MED ORDER — ROSUVASTATIN CALCIUM 10 MG PO TABS: 10.0000 mg | ORAL_TABLET | Freq: Every day | ORAL | 0 refills | Status: DC

## 2019-09-25 ENCOUNTER — Ambulatory Visit (INDEPENDENT_AMBULATORY_CARE_PROVIDER_SITE_OTHER): Payer: BC Managed Care – PPO | Admitting: Internal Medicine

## 2019-09-25 ENCOUNTER — Encounter: Payer: Self-pay | Admitting: Internal Medicine

## 2019-09-25 ENCOUNTER — Other Ambulatory Visit (INDEPENDENT_AMBULATORY_CARE_PROVIDER_SITE_OTHER): Payer: BC Managed Care – PPO

## 2019-09-25 ENCOUNTER — Other Ambulatory Visit: Payer: Self-pay

## 2019-09-25 VITALS — BP 124/84 | HR 97 | Temp 98.7°F | Ht 65.0 in | Wt 169.0 lb

## 2019-09-25 DIAGNOSIS — E611 Iron deficiency: Secondary | ICD-10-CM

## 2019-09-25 DIAGNOSIS — E559 Vitamin D deficiency, unspecified: Secondary | ICD-10-CM | POA: Diagnosis not present

## 2019-09-25 DIAGNOSIS — E538 Deficiency of other specified B group vitamins: Secondary | ICD-10-CM

## 2019-09-25 DIAGNOSIS — Z0000 Encounter for general adult medical examination without abnormal findings: Secondary | ICD-10-CM

## 2019-09-25 DIAGNOSIS — R739 Hyperglycemia, unspecified: Secondary | ICD-10-CM | POA: Insufficient documentation

## 2019-09-25 LAB — CBC WITH DIFFERENTIAL/PLATELET
Basophils Absolute: 0.1 10*3/uL (ref 0.0–0.1)
Basophils Relative: 1 % (ref 0.0–3.0)
Eosinophils Absolute: 0.1 10*3/uL (ref 0.0–0.7)
Eosinophils Relative: 1.7 % (ref 0.0–5.0)
HCT: 42.6 % (ref 36.0–46.0)
Hemoglobin: 14.7 g/dL (ref 12.0–15.0)
Lymphocytes Relative: 40.4 % (ref 12.0–46.0)
Lymphs Abs: 3.6 10*3/uL (ref 0.7–4.0)
MCHC: 34.6 g/dL (ref 30.0–36.0)
MCV: 90.9 fl (ref 78.0–100.0)
Monocytes Absolute: 0.6 10*3/uL (ref 0.1–1.0)
Monocytes Relative: 7.1 % (ref 3.0–12.0)
Neutro Abs: 4.4 10*3/uL (ref 1.4–7.7)
Neutrophils Relative %: 49.8 % (ref 43.0–77.0)
Platelets: 290 10*3/uL (ref 150.0–400.0)
RBC: 4.69 Mil/uL (ref 3.87–5.11)
RDW: 13 % (ref 11.5–15.5)
WBC: 8.9 10*3/uL (ref 4.0–10.5)

## 2019-09-25 LAB — HEPATIC FUNCTION PANEL
ALT: 35 U/L (ref 0–35)
AST: 27 U/L (ref 0–37)
Albumin: 4.4 g/dL (ref 3.5–5.2)
Alkaline Phosphatase: 92 U/L (ref 39–117)
Bilirubin, Direct: 0.1 mg/dL (ref 0.0–0.3)
Total Bilirubin: 0.4 mg/dL (ref 0.2–1.2)
Total Protein: 7.6 g/dL (ref 6.0–8.3)

## 2019-09-25 LAB — BASIC METABOLIC PANEL
BUN: 11 mg/dL (ref 6–23)
CO2: 29 mEq/L (ref 19–32)
Calcium: 9.4 mg/dL (ref 8.4–10.5)
Chloride: 101 mEq/L (ref 96–112)
Creatinine, Ser: 0.64 mg/dL (ref 0.40–1.20)
GFR: 97.44 mL/min (ref >60.00)
Glucose, Bld: 98 mg/dL (ref 70–99)
Potassium: 4.1 mEq/L (ref 3.5–5.1)
Sodium: 139 mEq/L (ref 135–145)

## 2019-09-25 LAB — IBC PANEL
Iron: 67 ug/dL (ref 42–145)
Saturation Ratios: 17.9 % — ABNORMAL LOW (ref 20.0–50.0)
Transferrin: 268 mg/dL (ref 212.0–360.0)

## 2019-09-25 LAB — LIPID PANEL
Cholesterol: 165 mg/dL (ref 0–200)
HDL: 64.3 mg/dL (ref >39.00)
LDL Cholesterol: 77 mg/dL (ref 0–99)
NonHDL: 100.33
Total CHOL/HDL Ratio: 3
Triglycerides: 117 mg/dL (ref 0.0–149.0)
VLDL: 23.4 mg/dL (ref 0.0–40.0)

## 2019-09-25 LAB — VITAMIN B12: Vitamin B-12: 333 pg/mL (ref 211–911)

## 2019-09-25 LAB — TSH: TSH: 0.08 u[IU]/mL — ABNORMAL LOW (ref 0.35–4.50)

## 2019-09-25 LAB — VITAMIN D 25 HYDROXY (VIT D DEFICIENCY, FRACTURES): VITD: 39.84 ng/mL (ref 30.00–100.00)

## 2019-09-25 LAB — HEMOGLOBIN A1C: Hgb A1c MFr Bld: 5.8 % (ref 4.6–6.5)

## 2019-09-25 MED ORDER — ZOSTER VAC RECOMB ADJUVANTED 50 MCG/0.5ML IM SUSR: 0.5000 mL | Freq: Once | INTRAMUSCULAR | 1 refills | Status: AC

## 2019-09-25 MED ORDER — HYDROCODONE-ACETAMINOPHEN 5-325 MG PO TABS: 1.0000 | ORAL_TABLET | Freq: Four times a day (QID) | ORAL | 0 refills | Status: DC | PRN

## 2019-09-25 MED ORDER — ALPRAZOLAM 0.5 MG PO TABS: ORAL_TABLET | ORAL | 5 refills | Status: DC

## 2019-09-25 MED ORDER — ROSUVASTATIN CALCIUM 10 MG PO TABS: 10.0000 mg | ORAL_TABLET | Freq: Every day | ORAL | 3 refills | Status: DC

## 2019-09-25 NOTE — Assessment & Plan Note (Signed)

## 2019-09-25 NOTE — Patient Instructions (Signed)
Your shingles shot prescription was sent to the pharmacy  Please continue all other medications as before, and refills have been done if requested.  Please have the pharmacy call with any other refills you may need.  Please continue your efforts at being more active, low cholesterol diet, and weight control.  You are otherwise up to date with prevention measures today.  Please keep your appointments with your specialists as you may have planned  Please go to the LAB in the Basement (turn left off the elevator) for the tests to be done today  You will be contacted by phone if any changes need to be made immediately.  Otherwise, you will receive a letter about your results with an explanation, but please check with MyChart first.  Please remember to sign up for MyChart if you have not done so, as this will be important to you in the future with finding out test results, communicating by private email, and scheduling acute appointments online when needed.  Please return in 1 year for your yearly visit, or sooner if needed, with Lab testing done 3-5 days before

## 2019-09-25 NOTE — Progress Notes (Signed)
Subjective:    Patient ID: [name], female    DOB: 1966/10/04, 53 y.o.   MRN: [id number]  HPI  Here for wellness and f/u;  Overall doing ok;  Pt denies Chest pain, worsening SOB, DOE, wheezing, orthopnea, PND, worsening LE edema, palpitations, dizziness or syncope.  Pt denies neurological change such as new headache, facial or extremity weakness.  Pt denies polydipsia, polyuria, or low sugar symptoms. Pt states overall good compliance with treatment and medications, good tolerability, and has been trying to follow appropriate diet.  Pt denies worsening depressive symptoms, suicidal ideation or panic. No fever, night sweats, wt loss, loss of appetite, or other constitutional symptoms.  Pt states good ability with ADL's, has low fall risk, home safety reviewed and adequate, no other significant changes in hearing or vision, and only occasionally active with exercise Still smokes occasionally, not ready to quit. Due for shingrix. More stress lately with family stressors Wt Readings from Last 3 Encounters:  09/25/19 169 lb (76.7 kg)  04/05/19 171 lb (77.6 kg)  01/24/19 171 lb 3.2 oz (77.7 kg)  Father ill with copd,dm and having to seen him every day after work.   Past Medical History:  Diagnosis Date  . ALLERGIC RHINITIS   . Anemia   . ANXIETY   . DEPRESSION   . Fibroid   . HYPERLIPIDEMIA    diet controlled, no meds  . Hypothyroidism   . Lichen sclerosus of female genitalia 2018  . Thyroid carcinoma [facility])    Past Surgical History:  Procedure Laterality Date  . ABDOMINAL HYSTERECTOMY N/A 01/01/2015   Procedure:  SUPRACERVICAL HYSTERECTOMY WITH BILATERAL SALPINGECTOMT  Surgeon: Jamey Reas de Berton Lan, MD;  Location: [place] [facility];  Service: Gynecology;  Laterality: N/A;  . BILATERAL SALPINGECTOMY Bilateral 01/01/2015   Procedure: BILATERAL SALPINGECTOMY        ;  Surgeon: Everardo All Amundson de Berton Lan, MD;  Location: [place] [facility];  Service: Gynecology;  Laterality: Bilateral;   . CESAREAN SECTION  2002   x 1  . TOTAL THYROIDECTOMY  2009/2010  . TUBAL LIGATION    . WISDOM TOOTH EXTRACTION      reports that she has been smoking cigarettes. She has a 7.75 pack-year smoking history. She has never used smokeless tobacco. She reports current alcohol use of about 3.0 standard drinks of alcohol per week. She reports that she does not use drugs. family history includes Breast cancer in her paternal grandmother; Breast cancer (age of onset: 85) in her mother; Cancer in her maternal grandmother; Diabetes in her father; Hypertension in an other family member; Stroke in an other family member. Allergies  Allergen Reactions  . Doxycycline Nausea Only  . Sulfonamide Derivatives Itching   Current Outpatient Medications on File Prior to Visit  Medication Sig Dispense Refill  . betamethasone valerate ointment (VALISONE) 0.1 % Apply to vulva twice daily for 2 weeks prn. May use twice weekly for maintenance dose. 45 g 1  . BIOTIN PO Take 1 tablet by mouth daily.     . clotrimazole (LOTRIMIN) 1 % cream Apply 1 application topically 2 (two) times daily. 30 g 0  . fluticasone (FLONASE) 50 MCG/ACT nasal spray Place 1 spray into both nostrils as needed.    Marland Kitchen levothyroxine (SYNTHROID, LEVOTHROID) 125 MCG tablet Take 1 tablet (125 mcg total) by mouth daily. 90 tablet 3  . linaclotide (LINZESS) 145 MCG CAPS capsule Take 1 capsule (145 mcg total) by mouth daily before breakfast.  12 capsule 0  . Omega-3 Fatty Acids (FISH OIL PO) Take 1 capsule by mouth daily.     Marland Kitchen omeprazole (PRILOSEC) 20 MG capsule Take 1 capsule (20 mg total) by mouth daily. 90 capsule 1  . polyethylene glycol (MIRALAX) packet Take 17 g by mouth daily. 14 each 0   No current facility-administered medications on file prior to visit.    Review of Systems Constitutional: Negative for other unusual diaphoresis, sweats, appetite or weight changes HENT: Negative for other worsening hearing loss, ear pain, facial swelling,  mouth sores or neck stiffness.   Eyes: Negative for other worsening pain, redness or other visual disturbance.  Respiratory: Negative for other stridor or swelling Cardiovascular: Negative for other palpitations or other chest pain  Gastrointestinal: Negative for worsening diarrhea or loose stools, blood in stool, distention or other pain Genitourinary: Negative for hematuria, flank pain or other change in urine volume.  Musculoskeletal: Negative for myalgias or other joint swelling.  Skin: Negative for other color change, or other wound or worsening drainage.  Neurological: Negative for other syncope or numbness. Hematological: Negative for other adenopathy or swelling Psychiatric/Behavioral: Negative for hallucinations, other worsening agitation, SI, self-injury, or new decreased concentration All otherwise neg per pt     Objective:   Physical Exam BP 124/84   Pulse 97   Temp 98.7 F (37.1 C) (Oral)   Ht 5\' 5"  (1.651 m)   Wt 169 lb (76.7 kg)   LMP 12/09/2014 (Exact Date)   SpO2 99%   BMI 28.12 kg/m  VS noted,  Constitutional: Pt is oriented to person, place, and time. Appears well-developed and well-nourished, in no significant distress and comfortable Head: Normocephalic and atraumatic  Eyes: Conjunctivae and EOM are normal. Pupils are equal, round, and reactive to light Right Ear: External ear normal without discharge Left Ear: External ear normal without discharge Nose: Nose without discharge or deformity Mouth/Throat: Oropharynx is without other ulcerations and moist  Neck: Normal range of motion. Neck supple. No JVD present. No tracheal deviation present or significant neck LA or mass Cardiovascular: Normal rate, regular rhythm, normal heart sounds and intact distal pulses.   Pulmonary/Chest: WOB normal and breath sounds without rales or wheezing  Abdominal: Soft. Bowel sounds are normal. NT. No HSM  Musculoskeletal: Normal range of motion. Exhibits no edema  Lymphadenopathy: Has no other cervical adenopathy.  Neurological: Pt is alert and oriented to person, place, and time. Pt has normal reflexes. No cranial nerve deficit. Motor grossly intact, Gait intact Skin: Skin is warm and dry. No rash noted or new ulcerations Psychiatric:  Has normal mood and affect. Behavior is normal without agitation All otherwise neg per pt Lab Results  Component Value Date   WBC 7.7 09/29/2018   HGB 14.9 09/29/2018   HCT 45.3 09/29/2018   PLT 360 09/29/2018   GLUCOSE 88 09/29/2018   CHOL 216 (H) 09/20/2018   TRIG 179.0 (H) 09/20/2018   HDL 55.50 09/20/2018   LDLDIRECT 131.0 03/10/2007   LDLCALC 125 (H) 09/20/2018   ALT 17 09/20/2018   AST 16 09/20/2018   NA 139 09/29/2018   K 3.6 09/29/2018   CL 103 09/29/2018   CREATININE 0.68 09/29/2018   BUN 13 09/29/2018   CO2 27 09/29/2018   TSH 0.36 09/20/2018        Assessment & Plan:

## 2019-09-25 NOTE — Assessment & Plan Note (Signed)
For f/u lab 

## 2019-09-26 ENCOUNTER — Encounter: Payer: Self-pay | Admitting: Internal Medicine

## 2019-09-27 ENCOUNTER — Encounter: Payer: Self-pay | Admitting: Internal Medicine

## 2019-09-27 MED ORDER — ALPRAZOLAM 0.5 MG PO TABS: ORAL_TABLET | ORAL | 5 refills | Status: DC

## 2019-10-03 ENCOUNTER — Ambulatory Visit (INDEPENDENT_AMBULATORY_CARE_PROVIDER_SITE_OTHER): Payer: BC Managed Care – PPO | Admitting: Gastroenterology

## 2019-10-03 ENCOUNTER — Encounter: Payer: Self-pay | Admitting: Gastroenterology

## 2019-10-03 VITALS — BP 132/100 | HR 88 | Temp 96.9°F | Ht 65.0 in | Wt 169.4 lb

## 2019-10-03 DIAGNOSIS — K5900 Constipation, unspecified: Secondary | ICD-10-CM | POA: Diagnosis not present

## 2019-10-03 DIAGNOSIS — R140 Abdominal distension (gaseous): Secondary | ICD-10-CM | POA: Diagnosis not present

## 2019-10-03 MED ORDER — POLYETHYLENE GLYCOL 3350 17 G PO PACK: 34.0000 g | PACK | Freq: Two times a day (BID) | ORAL | 0 refills | Status: AC

## 2019-10-03 NOTE — Progress Notes (Signed)
HPI :  53 year old female here for a follow-up visit.  I know her previously for history of constipation and suspected reflux.  We also performed her screening colonoscopy here in May 2017 at which point time she had 1 small adenoma.  Her main complaint is abdominal distention along with bloating and gas that has been bothering her over the past year or so.  She states she has periodic episodes where she feels quite distended bloated and gassy, the symptoms are relieved with a bowel movement.  This can occur a couple times a month or so.  Usually goes away with time.  When she gets distended like this she feels quite nauseated.  She denies any reflux symptoms that bother her in regards to pyrosis or regurgitation.  No dysphagia.  No vomiting at all.  She is otherwise eating well.  Eating does not make her feel worse.  She has about 2-3 bowel movements per week.  I previously had given her some Linzess to use and she is taking this dosed at 145 mcg a day which definitely helps treat her constipation, however she feels like she cannot take this during the week when she is working in [employer], applications.  She tends to use the Linzess only on the weekends.  During the week she has been using MiraLAX capful to 2 capfuls every day as needed.  She states the MiraLAX has helped her compared to not taking anything but she remains constipated at times and not moving her bowels regularly.  She denies any blood in her stools.  She does feel better when she moves her bowels more frequently.  She denies any weight loss or other alarm symptoms.  She does take Norco for history of back pain but takes it periodically and not routinely.  She had labs done November 9 which show normal blood counts without anemia, normal liver enzymes and renal function.  Colonoscopy 04/14/2016 - normal ileum, 3 small polyps - at least one adenoma, repeat in 5 years   Past Medical History:  Diagnosis Date  . ALLERGIC RHINITIS   . Anemia    . ANXIETY   . DEPRESSION   . Fibroid   . HYPERLIPIDEMIA    diet controlled, no meds  . Hypothyroidism   . Lichen sclerosus of female genitalia 2018  . Thyroid carcinoma [facility])      Past Surgical History:  Procedure Laterality Date  . ABDOMINAL HYSTERECTOMY N/A 01/01/2015   Procedure:  SUPRACERVICAL HYSTERECTOMY WITH BILATERAL SALPINGECTOMT  Surgeon: Jamey Reas de Berton Lan, MD;  Location: [place] [facility];  Service: Gynecology;  Laterality: N/A;  . BILATERAL SALPINGECTOMY Bilateral 01/01/2015   Procedure: BILATERAL SALPINGECTOMY        ;  Surgeon: Everardo All Amundson de Berton Lan, MD;  Location: [place] [facility];  Service: Gynecology;  Laterality: Bilateral;  . CESAREAN SECTION  2002   x 1  . TOTAL THYROIDECTOMY  2009/2010  . TUBAL LIGATION    . WISDOM TOOTH EXTRACTION     Family History  Problem Relation Age of Onset  . Hypertension Other   . Stroke Other   . Breast cancer Mother 71  . Breast cancer Paternal Grandmother   . Diabetes Father   . Cancer Maternal Grandmother   . Colon cancer Neg Hx    Social History   Tobacco Use  . Smoking status: Current Every Day Smoker    Packs/day: 0.25    Years: 31.00    Pack years:  7.75    Types: Cigarettes  . Smokeless tobacco: Never Used  Substance Use Topics  . Alcohol use: Yes    Alcohol/week: 3.0 standard drinks    Types: 3 Cans of beer per week    Comment: 6 beers per week  . Drug use: No   Current Outpatient Medications  Medication Sig Dispense Refill  . ALPRAZolam (XANAX) 0.5 MG tablet TAKE 1/2-1 TABLET BY MOUTH DAILY AS NEEDED 30 tablet 5  . betamethasone valerate ointment (VALISONE) 0.1 % Apply to vulva twice daily for 2 weeks prn. May use twice weekly for maintenance dose. 45 g 1  . BIOTIN PO Take 1 tablet by mouth daily.     . fluticasone (FLONASE) 50 MCG/ACT nasal spray Place 1 spray into both nostrils as needed.    Marland Kitchen HYDROcodone-acetaminophen (NORCO/VICODIN) 5-325 MG tablet Take 1 tablet by mouth every 6 (six) hours  as needed. 15 tablet 0  . levothyroxine (SYNTHROID, LEVOTHROID) 125 MCG tablet Take 1 tablet (125 mcg total) by mouth daily. 90 tablet 3  . linaclotide (LINZESS) 145 MCG CAPS capsule Take 1 capsule (145 mcg total) by mouth daily before breakfast. 12 capsule 0  . Omega-3 Fatty Acids (FISH OIL PO) Take 1 capsule by mouth daily.     Marland Kitchen omeprazole (PRILOSEC) 20 MG capsule Take 1 capsule (20 mg total) by mouth daily. 90 capsule 1  . polyethylene glycol (MIRALAX) packet Take 17 g by mouth daily. 14 each 0  . rosuvastatin (CRESTOR) 10 MG tablet Take 1 tablet (10 mg total) by mouth daily. 90 tablet 3   No current facility-administered medications for this visit.    Allergies  Allergen Reactions  . Doxycycline Nausea Only  . Sulfonamide Derivatives Itching     Review of Systems: All systems reviewed and negative except where noted in HPI.   Lab Results  Component Value Date   WBC 8.9 09/25/2019   HGB 14.7 09/25/2019   HCT 42.6 09/25/2019   MCV 90.9 09/25/2019   PLT 290.0 09/25/2019    Lab Results  Component Value Date   CREATININE 0.64 09/25/2019   BUN 11 09/25/2019   NA 139 09/25/2019   K 4.1 09/25/2019   CL 101 09/25/2019   CO2 29 09/25/2019    Lab Results  Component Value Date   ALT 35 09/25/2019   AST 27 09/25/2019   ALKPHOS 92 09/25/2019   BILITOT 0.4 09/25/2019     Physical Exam: BP (!) 132/100   Pulse 88   Temp (!) 96.9 F (36.1 C)   Ht 5\' 5"  (1.651 m)   Wt 169 lb 6.4 oz (76.8 kg)   LMP 12/09/2014 (Exact Date)   BMI 28.19 kg/m  Constitutional: Pleasant,well-developed, female in no acute distress. HEENT: Normocephalic and atraumatic. Conjunctivae are normal. No scleral icterus. Neck supple.  Cardiovascular: Normal rate, regular rhythm.  Pulmonary/chest: Effort normal and breath sounds normal. No wheezing, rales or rhonchi. Abdominal: Soft, nondistended, nontender. There are no masses palpable. No hepatomegaly. Extremities: no edema Lymphadenopathy: No  cervical adenopathy noted. Neurological: Alert and oriented to person place and time. Skin: Skin is warm and dry. No rashes noted. Psychiatric: Normal mood and affect. Behavior is normal.   ASSESSMENT AND PLAN: 53 year old female here for reassessment of the following:  Abdominal bloating / constipation - I think the patient's bloating is likely due to constipation that has not been adequately treated.  Linzess helps although she is not taking it routinely given it causes urgency when she is  working etc.  I would like to see how she feels when her constipation has been better treated and I suspect she will have less bloating and distention.  I reassured her that her labs are normal and her colonoscopy is up-to-date without any concerning findings.  We discussed options, she is not comfortable using Linzess during the week due to her work schedule but will continue use it on the weekends.  During the week I recommend she use MiraLAX double dose twice daily and titrate up or down as needed for goal of 1 bowel movement every 1 to 2 days.  If this regimen does not work for her asked her to contact me and we will discuss other options.  Otherwise I did counsel her on a low FODMAP diet to see if there is any dietary triggers to her bloating and see if that would help at all.  If her symptoms persist despite dietary changes and moving her bowels more regularly with the laxative regimen, I asked her to contact me for reassessment and will discuss if any other workup needs to be done.  She will update me a few weeks and let me know how she is doing, if this works do not think we will need to pursue additional workup.  All questions answered.  Medora Cellar, MD [facility]

## 2019-10-03 NOTE — Patient Instructions (Addendum)
If you are age 52 or older, your body mass index should be between 23-30. Your Body mass index is 28.19 kg/m. If this is out of the aforementioned range listed, please consider follow up with your Primary Care Provider.  If you are age 66 or younger, your body mass index should be between 19-25. Your Body mass index is 28.19 kg/m. If this is out of the aformentioned range listed, please consider follow up with your Primary Care Provider.   To help prevent the possible spread of infection to our patients, communities, and staff; we will be implementing the following measures:  As of now we are not allowing any visitors/family members to accompany you to any upcoming appointments with [facility]. If you have any concerns about this please contact our office to discuss prior to the appointment.    We have given you samples of the following medication to take: Linzess 138mcg: Take once daily in the morning [id number],  Exp: 05-2020)  Please purchase the following medications over the counter and take as directed: Miralax: Take 2 capfuls as directed twice a day. May increase as needed  We are giving you a Low FOD-MAP diet to follow.    Please call back in 2 weeks and ask for Dr. Doyne Keel nurse if you are not feeling better.  Thank you for entrusting me with your care and for choosing [facility], Dr. Dyess Cellar

## 2019-10-05 ENCOUNTER — Telehealth: Payer: Self-pay | Admitting: Obstetrics and Gynecology

## 2019-10-05 NOTE — Telephone Encounter (Signed)
Patient stated that she wore a pair of jeans without washing them first one day last week. Patient began experiencing vaginal itching and burning about 2-3 days after wearing the jeans. Patient stated that she first thought it might be a flair up of lichen schlerosis and applied Betamethason. The following day she was still experiencing itching, but not as bad. Patient went and bought Monistat and applied that to the area last night. Patient is calling to see if she should take the second dose of Monistat tonight, or apply something different.

## 2019-10-05 NOTE — Telephone Encounter (Signed)
Left message to call triage RN at [place].

## 2019-10-18 NOTE — Telephone Encounter (Signed)
Spoke with pt. Pt agreeable for OV. OV for consult and possible labs scheduled 10/25/2019 at 4:30pm per pts request.   Will route to Dr Quincy Simmonds for final review and close encounter.

## 2019-10-18 NOTE — Telephone Encounter (Signed)
Placed call to [name] to get update from phone encounter on 10/05/19. [name] states having no more issues with itching and burning and took monistat and is better.   [name] mentioned that still having bloating off and on. Saw GI on 10/03/19 and was told to see GYN for possible "ovary/uterus problem/cancer" [name] wanting to know if Dr Quincy Simmonds could run the CAT-125 test and if she needs to be seen regarding this before scheduled AEX 01/24/2020. Will return call to [name] once recommendations made by Dr Quincy Simmonds. [name] agreeable.   Will route to Dr Quincy Simmonds for recommendations. Please advise.

## 2019-10-18 NOTE — Telephone Encounter (Signed)
Please schedule an appointment with me.  °

## 2019-10-20 ENCOUNTER — Encounter: Payer: Self-pay | Admitting: Internal Medicine

## 2019-10-20 MED ORDER — HYDROCODONE-ACETAMINOPHEN 5-325 MG PO TABS: 1.0000 | ORAL_TABLET | Freq: Four times a day (QID) | ORAL | 0 refills | Status: DC | PRN

## 2019-10-20 NOTE — Telephone Encounter (Signed)
Done erx 

## 2019-10-24 NOTE — Progress Notes (Signed)
GYNECOLOGY  VISIT   HPI: 53 y.o.   Married  Caucasian  female   [id number] with Patient's last menstrual period was 12/09/2014 (exact date).   here for abdominal bloating.   She fills so bloated, it is hard to breath.  The bloating goes away sometimes after a bowel movement and sometimes no.  She takes Miralax twice daily.  Takes Linzess on the weekend.  This does help her symptoms.   No abdominal or pelvic pain.   Some hot flashes which are short lived.  No sleep disruption.    Family stress.  Caring for father with some cognitive issues.   Father did not answer the phone when she called, and she was worried for his life when he did not answer Strife with a sister.  States she talked to a friend who takes Paxil and does well with it.  GYNECOLOGIC HISTORY: Patient's last menstrual period was 12/09/2014 (exact date). Contraception: Tubal/Hysterectomy--ovaries remain Menopausal hormone therapy: none Last mammogram: 05-22-19 3D/Neg/density B/BiRads1 Last pap smear:   12-29-17 Neg:Neg HR HPV, 11-06-16 Neg:Neg HR HPV        OB History    Gravida  1   Para  1   Term  1   Preterm      AB      Living  1     SAB      TAB      Ectopic      Multiple      Live Births  1              Patient Active Problem List   Diagnosis Date Noted  . Hyperglycemia 09/25/2019  . Right facial swelling 09/20/2018  . Low back pain 03/25/2017  . Lipoma of abdominal wall 03/23/2017  . Vitamin D deficiency 02/01/2017  . Left ankle pain 09/23/2016  . Chronic constipation 07/21/2016  . Cough 08/13/2015  . Status post abdominal supracervical subtotal hysterectomy 02/10/2015  . Status post total abdominal hysterectomy 01/01/2015  . Elevated blood pressure 12/18/2014  . Smoker 12/18/2014  . Other chest pain 09/10/2014  . Right flank pain 09/05/2014  . Chronic low back pain 06/12/2014  . Malignant neoplasm of thyroid gland ([place]) 09/07/2011  . 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  Preventative health care 03/17/2011  . LUMBAR RADICULOPATHY, RIGHT 08/21/2008  . PARESTHESIA 07/30/2008  . Hyperlipidemia 07/18/2008  . Anxiety state 07/18/2008  . DEPRESSION 07/18/2008  . ALLERGIC RHINITIS 07/18/2008    Past Medical History:  Diagnosis Date  . ALLERGIC RHINITIS   . Anemia   . ANXIETY   . DEPRESSION   . Fibroid   . HYPERLIPIDEMIA    diet controlled, no meds  . Hypothyroidism   . Lichen sclerosus of female genitalia 2018  . Thyroid carcinoma [facility])     Past Surgical History:  Procedure Laterality Date  . ABDOMINAL HYSTERECTOMY N/A 01/01/2015   Procedure:  SUPRACERVICAL HYSTERECTOMY WITH BILATERAL SALPINGECTOMT  Surgeon: Jamey Reas de Berton Lan, MD;  Location: [facility];  Service: Gynecology;  Laterality: N/A;  . BILATERAL SALPINGECTOMY Bilateral 01/01/2015   Procedure: BILATERAL SALPINGECTOMY        ;  Surgeon: Everardo All Amundson de Berton Lan, MD;  Location: [facility];  Service: Gynecology;  Laterality: Bilateral;  . CESAREAN SECTION  2002   x 1  . TOTAL THYROIDECTOMY  2009/2010  . TUBAL LIGATION    . WISDOM TOOTH EXTRACTION      Current Outpatient Medications  Medication  Sig Dispense Refill  . ALPRAZolam (XANAX) 0.5 MG tablet TAKE 1/2-1 TABLET BY MOUTH DAILY AS NEEDED 30 tablet 5  . betamethasone valerate ointment (VALISONE) 0.1 % Apply to vulva twice daily for 2 weeks prn. May use twice weekly for maintenance dose. 45 g 1  . BIOTIN PO Take 1 tablet by mouth daily.     . fluticasone (FLONASE) 50 MCG/ACT nasal spray Place 1 spray into both nostrils as needed.    Marland Kitchen HYDROcodone-acetaminophen (NORCO/VICODIN) 5-325 MG tablet Take 1 tablet by mouth every 6 (six) hours as needed. 15 tablet 0  . levothyroxine (SYNTHROID, LEVOTHROID) 125 MCG tablet Take 1 tablet (125 mcg total) by mouth daily. 90 tablet 3  . linaclotide (LINZESS) 145 MCG CAPS capsule Take 1 capsule (145 mcg total) by mouth daily before breakfast. 12 capsule 0  . Omega-3 Fatty Acids (FISH  OIL PO) Take 1 capsule by mouth daily.     Marland Kitchen omeprazole (PRILOSEC) 20 MG capsule Take 1 capsule (20 mg total) by mouth daily. 90 capsule 1  . polyethylene glycol (MIRALAX) 17 g packet Take 34 g by mouth 2 (two) times daily. You may increase as needed 14 each 0  . rosuvastatin (CRESTOR) 10 MG tablet Take 1 tablet (10 mg total) by mouth daily. 90 tablet 3   No current facility-administered medications for this visit.      ALLERGIES: Doxycycline and Sulfonamide derivatives  Family History  Problem Relation Age of Onset  . Hypertension Other   . Stroke Other   . Breast cancer Mother 11  . Breast cancer Paternal Grandmother   . Diabetes Father   . Cancer Maternal Grandmother   . Colon cancer Neg Hx     Social History   Socioeconomic History  . Marital status: Married    Spouse name: Not on file  . Number of children: 1  . Years of education: Not on file  . Highest education level: Not on file  Occupational History  . Occupation: Therapist, art  Social Needs  . Financial resource strain: Not on file  . Food insecurity    Worry: Not on file    Inability: Not on file  . Transportation needs    Medical: Not on file    Non-medical: Not on file  Tobacco Use  . Smoking status: Current Every Day Smoker    Packs/day: 0.25    Years: 31.00    Pack years: 7.75    Types: Cigarettes  . Smokeless tobacco: Never Used  Substance and Sexual Activity  . Alcohol use: Yes    Alcohol/week: 3.0 standard drinks    Types: 3 Cans of beer per week    Comment: 6 beers per week  . Drug use: No  . Sexual activity: Not Currently    Partners: Male    Birth control/protection: Surgical    Comment: tubal/TAH  Lifestyle  . Physical activity    Days per week: Not on file    Minutes per session: Not on file  . Stress: Not on file  Relationships  . Social Herbalist on phone: Not on file    Gets together: Not on file    Attends religious service: Not on file    Active member of  club or organization: Not on file    Attends meetings of clubs or organizations: Not on file    Relationship status: Not on file  . Intimate partner violence    Fear of current or ex partner:  Not on file    Emotionally abused: Not on file    Physically abused: Not on file    Forced sexual activity: Not on file  Other Topics Concern  . Not on file  Social History Narrative  . Not on file    Review of Systems  Gastrointestinal: Positive for abdominal distention.  All other systems reviewed and are negative.   PHYSICAL EXAMINATION:    BP 126/82   Pulse 88   Temp (!) 97.2 F (36.2 C) (Temporal)   Ht 5' 3.75" (1.619 m)   Wt 170 lb 12.8 oz (77.5 kg)   LMP 12/09/2014 (Exact Date)   BMI 29.55 kg/m     General appearance: alert, cooperative and appears stated age   Lungs: clear to auscultation bilaterally Heart: regular rate and rhythm Abdomen: soft, non-tender, no masses,  no organomegaly   Pelvic: External genitalia:  Hypopigmented areas of the vulva.              Urethra:  normal appearing urethra with no masses, tenderness or lesions              Bartholins and Skenes: normal                 Vagina: normal appearing vagina with normal color and discharge, no lesions              Cervix: no lesions                Bimanual Exam:  Uterus:  normal size, contour, position, consistency, mobility, non-tender              Adnexa: no mass, fullness, tenderness              Chaperone was present for exam.  ASSESSMENT  Stress.  Menopausal symptoms.  Abdominal bloating. Status post supracervical hysterectomy and bilateral salpingectomy.  Ovaries remain.  FH breast CA.  Hypopigmentation of vulva.  LS.  PLAN  We discussed Paxil and Effexor XR.   Risks and benefits. She would like to start Paxil 10 mg.  #30, RF one.  Return for pelvic US.  FU 6 weeks for med check.  Can be a virtual visit.  She will consider genetic counseling.  I reviewed treatment for her lichen  sclerosus using the Valisone ointment.    An After Visit Summary was printed and given to the patient.  __25____ minutes face to face time of which over 50% was spent in counseling.

## 2019-10-25 ENCOUNTER — Encounter: Payer: Self-pay | Admitting: Obstetrics and Gynecology

## 2019-10-25 ENCOUNTER — Other Ambulatory Visit: Payer: Self-pay

## 2019-10-25 ENCOUNTER — Ambulatory Visit: Payer: BC Managed Care – PPO | Admitting: Obstetrics and Gynecology

## 2019-10-25 ENCOUNTER — Telehealth: Payer: Self-pay | Admitting: Obstetrics and Gynecology

## 2019-10-25 VITALS — BP 126/82 | HR 88 | Temp 97.2°F | Ht 63.75 in | Wt 170.8 lb

## 2019-10-25 DIAGNOSIS — L900 Lichen sclerosus et atrophicus: Secondary | ICD-10-CM

## 2019-10-25 DIAGNOSIS — R140 Abdominal distension (gaseous): Secondary | ICD-10-CM

## 2019-10-25 DIAGNOSIS — F439 Reaction to severe stress, unspecified: Secondary | ICD-10-CM | POA: Diagnosis not present

## 2019-10-25 DIAGNOSIS — N951 Menopausal and female climacteric states: Secondary | ICD-10-CM | POA: Diagnosis not present

## 2019-10-25 MED ORDER — PAROXETINE HCL 10 MG PO TABS: 10.0000 mg | ORAL_TABLET | ORAL | 1 refills | Status: DC

## 2019-10-25 NOTE — Telephone Encounter (Signed)
Please precert and schedule pelvic US for tomorrow.   Patient coming in today for evaluation symptoms.  She has had a hysterectomy.   I will discuss this with the patient this afternoon, so she is currently unaware of this possible plan but may expect this.   Cc- Lerry Liner

## 2019-10-25 NOTE — Telephone Encounter (Signed)
Due to work schedule, pt couldn't come for [place] on 12/10 at 11am. Pt now scheduled for 12/22 at 230pm for [place] and OV with Dr Quincy Simmonds.  Pt agreeable.   Will route to Dr Quincy Simmonds for review Katy Fitch, pt aware of PR.$45

## 2019-10-25 NOTE — Patient Instructions (Signed)
Paroxetine tablets What is this medicine? PAROXETINE (pa ROX e teen) is used to treat depression. It may also be used to treat anxiety disorders, obsessive compulsive disorder, panic attacks, post traumatic stress, and premenstrual dysphoric disorder (PMDD). This medicine may be used for other purposes; ask your health care provider or pharmacist if you have questions. COMMON BRAND NAME(S): Paxil, Pexeva What should I tell my health care provider before I take this medicine? They need to know if you have any of these conditions:  bipolar disorder or a family history of bipolar disorder  bleeding disorders  glaucoma  heart disease  kidney disease  liver disease  low levels of sodium in the blood  seizures  suicidal thoughts, plans, or attempt; a previous suicide attempt by you or a family member  take MAOIs like Carbex, Eldepryl, Marplan, Nardil, and Parnate  take medicines that treat or prevent blood clots  thyroid disease  an unusual or allergic reaction to paroxetine, other medicines, foods, dyes, or preservatives  pregnant or trying to get pregnant  breast-feeding How should I use this medicine? Take this medicine by mouth with a glass of water. Follow the directions on the prescription label. You can take it with or without food. Take your medicine at regular intervals. Do not take your medicine more often than directed. Do not stop taking this medicine suddenly except upon the advice of your doctor. Stopping this medicine too quickly may cause serious side effects or your condition may worsen. A special MedGuide will be given to you by the pharmacist with each prescription and refill. Be sure to read this information carefully each time. Talk to your pediatrician regarding the use of this medicine in children. Special care may be needed. Overdosage: If you think you have taken too much of this medicine contact a poison control center or emergency room at once. NOTE:  This medicine is only for you. Do not share this medicine with others. What if I miss a dose? If you miss a dose, take it as soon as you can. If it is almost time for your next dose, take only that dose. Do not take double or extra doses. What may interact with this medicine? Do not take this medicine with any of the following medications:  linezolid  MAOIs like Carbex, Eldepryl, Marplan, Nardil, and Parnate  methylene blue (injected into a vein)  pimozide  thioridazine This medicine may also interact with the following medications:  alcohol  amphetamines  aspirin and aspirin-like medicines  atomoxetine  certain medicines for depression, anxiety, or psychotic disturbances  certain medicines for irregular heart beat like propafenone, flecainide, encainide, and quinidine  certain medicines for migraine headache like almotriptan, eletriptan, frovatriptan, naratriptan, rizatriptan, sumatriptan, zolmitriptan  cimetidine  digoxin  diuretics  fentanyl  fosamprenavir  furazolidone  isoniazid  lithium  medicines that treat or prevent blood clots like warfarin, enoxaparin, and dalteparin  medicines for sleep  NSAIDs, medicines for pain and inflammation, like ibuprofen or naproxen  phenobarbital  phenytoin  procarbazine  rasagiline  ritonavir  supplements like St. John's wort, kava kava, valerian  tamoxifen  tramadol  tryptophan This list may not describe all possible interactions. Give your health care provider a list of all the medicines, herbs, non-prescription drugs, or dietary supplements you use. Also tell them if you smoke, drink alcohol, or use illegal drugs. Some items may interact with your medicine. What should I watch for while using this medicine? Tell your doctor if your symptoms do not   get better or if they get worse. Visit your doctor or health care professional for regular checks on your progress. Because it may take several weeks to see  the full effects of this medicine, it is important to continue your treatment as prescribed by your doctor. Patients and their families should watch out for new or worsening thoughts of suicide or depression. Also watch out for sudden changes in feelings such as feeling anxious, agitated, panicky, irritable, hostile, aggressive, impulsive, severely restless, overly excited and hyperactive, or not being able to sleep. If this happens, especially at the beginning of treatment or after a change in dose, call your health care professional. You may get drowsy or dizzy. Do not drive, use machinery, or do anything that needs mental alertness until you know how this medicine affects you. Do not stand or sit up quickly, especially if you are an older patient. This reduces the risk of dizzy or fainting spells. Alcohol may interfere with the effect of this medicine. Avoid alcoholic drinks. Your mouth may get dry. Chewing sugarless gum or sucking hard candy, and drinking plenty of water will help. Contact your doctor if the problem does not go away or is severe. What side effects may I notice from receiving this medicine? Side effects that you should report to your doctor or health care professional as soon as possible:  allergic reactions like skin rash, itching or hives, swelling of the face, lips, or tongue  anxious  black, tarry stools  changes in vision  confusion  elevated mood, decreased need for sleep, racing thoughts, impulsive behavior  eye pain  fast, irregular heartbeat  feeling faint or lightheaded, falls  feeling agitated, angry, or irritable  hallucination, loss of contact with reality  loss of balance or coordination  loss of memory  painful or prolonged erections  restlessness, pacing, inability to keep still  seizures  stiff muscles  suicidal thoughts or other mood changes  trouble sleeping  unusual bleeding or bruising  unusually weak or tired  vomiting Side  effects that usually do not require medical attention (report to your doctor or health care professional if they continue or are bothersome):  change in appetite or weight  change in sex drive or performance  diarrhea  dizziness  dry mouth  increased sweating  indigestion, nausea  tired  tremors This list may not describe all possible side effects. Call your doctor for medical advice about side effects. You may report side effects to FDA at 1-800-FDA-1088. Where should I keep my medicine? Keep out of the reach of children. Store at room temperature between 15 and 30 degrees C (59 and 86 degrees F). Keep container tightly closed. Throw away any unused medicine after the expiration date. NOTE: This sheet is a summary. It may not cover all possible information. If you have questions about this medicine, talk to your doctor, pharmacist, or health care provider.  2020 Elsevier/Gold Standard (2016-04-04 15:50:32)  

## 2019-10-25 NOTE — Telephone Encounter (Signed)
PUS scheduled for 12/10 at 11am then to see Dr Quincy Simmonds to follow.  Suzy aware for precert and orders placed for abd bloating as dx.   Please review Dr Quincy Simmonds. Pt aware of appt. Pt agreeable.

## 2019-10-25 NOTE — Telephone Encounter (Signed)
Call placed to patient to review benefit for scheduled ultrasound appointment Left voicemail message requesting a return call.

## 2019-10-26 ENCOUNTER — Other Ambulatory Visit: Payer: Self-pay

## 2019-10-26 ENCOUNTER — Other Ambulatory Visit: Payer: BC Managed Care – PPO

## 2019-10-26 ENCOUNTER — Other Ambulatory Visit: Payer: BC Managed Care – PPO | Admitting: Obstetrics and Gynecology

## 2019-11-07 ENCOUNTER — Ambulatory Visit: Payer: BC Managed Care – PPO | Admitting: Obstetrics and Gynecology

## 2019-11-07 ENCOUNTER — Ambulatory Visit (INDEPENDENT_AMBULATORY_CARE_PROVIDER_SITE_OTHER): Payer: BC Managed Care – PPO

## 2019-11-07 ENCOUNTER — Ambulatory Visit: Payer: BC Managed Care – PPO

## 2019-11-07 ENCOUNTER — Other Ambulatory Visit: Payer: Self-pay

## 2019-11-07 ENCOUNTER — Encounter: Payer: Self-pay | Admitting: Obstetrics and Gynecology

## 2019-11-07 VITALS — BP 128/80 | HR 76 | Temp 97.0°F | Resp 16 | Ht 63.75 in | Wt 171.2 lb

## 2019-11-07 DIAGNOSIS — R140 Abdominal distension (gaseous): Secondary | ICD-10-CM

## 2019-11-07 DIAGNOSIS — R9341 Abnormal radiologic findings on diagnostic imaging of renal pelvis, ureter, or bladder: Secondary | ICD-10-CM

## 2019-11-07 DIAGNOSIS — F439 Reaction to severe stress, unspecified: Secondary | ICD-10-CM

## 2019-11-07 NOTE — Progress Notes (Signed)
GYNECOLOGY  VISIT   HPI: 53 y.o.   Married  Caucasian  female   [id number] with Patient's last menstrual period was 12/09/2014 (exact date).   here for pelvic ultrasound for abdominal bloating.   She feels better after taking Linzess.   Denies dysuria, blood in the urine.   She did not start the Paxil. She is having less family stress.  Planning to quit tobacco next year.   GYNECOLOGIC HISTORY: Patient's last menstrual period was 12/09/2014 (exact date). Contraception: Tubal/Hysteretomy-ovaries remain Menopausal hormone therapy:  none Last mammogram:  05-22-19 3D/Neg/density B/BiRads1 Last pap smear:  12-29-17 Neg:Neg HR HPV, 11-06-16 Neg:Neg HR HPV        OB History    Gravida  1   Para  1   Term  1   Preterm      AB      Living  1     SAB      TAB      Ectopic      Multiple      Live Births  1              Patient Active Problem List   Diagnosis Date Noted  . Hyperglycemia 09/25/2019  . Right facial swelling 09/20/2018  . Low back pain 03/25/2017  . Lipoma of abdominal wall 03/23/2017  . Vitamin D deficiency 02/01/2017  . Left ankle pain 09/23/2016  . Chronic constipation 07/21/2016  . Cough 08/13/2015  . Status post abdominal supracervical subtotal hysterectomy 02/10/2015  . Status post total abdominal hysterectomy 01/01/2015  . Elevated blood pressure 12/18/2014  . Smoker 12/18/2014  . Other chest pain 09/10/2014  . Right flank pain 09/05/2014  . Chronic low back pain 06/12/2014  . 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 Preventative health care 03/17/2011  . LUMBAR RADICULOPATHY, RIGHT 08/21/2008  . PARESTHESIA 07/30/2008  . Hyperlipidemia 07/18/2008  . Anxiety state 07/18/2008  . DEPRESSION 07/18/2008  . ALLERGIC RHINITIS 07/18/2008    Past Medical History:  Diagnosis Date  . ALLERGIC RHINITIS   . Anemia   . ANXIETY   . DEPRESSION   . Fibroid   . HYPERLIPIDEMIA    diet  controlled, no meds  . Hypothyroidism   . Lichen sclerosus of female genitalia 2018  . Thyroid carcinoma [facility])     Past Surgical History:  Procedure Laterality Date  . ABDOMINAL HYSTERECTOMY N/A 01/01/2015   Procedure:  SUPRACERVICAL HYSTERECTOMY WITH BILATERAL SALPINGECTOMT  Surgeon: Jamey Reas de Berton Lan, MD;  Location: [facility];  Service: Gynecology;  Laterality: N/A;  . BILATERAL SALPINGECTOMY Bilateral 01/01/2015   Procedure: BILATERAL SALPINGECTOMY        ;  Surgeon: Everardo All Amundson de Berton Lan, MD;  Location: [facility];  Service: Gynecology;  Laterality: Bilateral;  . CESAREAN SECTION  2002   x 1  . TOTAL THYROIDECTOMY  2009/2010  . TUBAL LIGATION    . WISDOM TOOTH EXTRACTION      Current Outpatient Medications  Medication Sig Dispense Refill  . ALPRAZolam (XANAX) 0.5 MG tablet TAKE 1/2-1 TABLET BY MOUTH DAILY AS NEEDED 30 tablet 5  . betamethasone valerate ointment (VALISONE) 0.1 % Apply to vulva twice daily for 2 weeks prn. May use twice weekly for maintenance dose. 45 g 1  . BIOTIN PO Take 1 tablet by mouth daily.     . fluticasone (FLONASE) 50 MCG/ACT nasal spray Place 1 spray into both nostrils  as needed.    Marland Kitchen HYDROcodone-acetaminophen (NORCO/VICODIN) 5-325 MG tablet Take 1 tablet by mouth every 6 (six) hours as needed. 15 tablet 0  . levothyroxine (SYNTHROID, LEVOTHROID) 125 MCG tablet Take 1 tablet (125 mcg total) by mouth daily. 90 tablet 3  . linaclotide (LINZESS) 145 MCG CAPS capsule Take 1 capsule (145 mcg total) by mouth daily before breakfast. 12 capsule 0  . Omega-3 Fatty Acids (FISH OIL PO) Take 1 capsule by mouth daily.     Marland Kitchen omeprazole (PRILOSEC) 20 MG capsule Take 1 capsule (20 mg total) by mouth daily. 90 capsule 1  . PARoxetine (PAXIL) 10 MG tablet Take 1 tablet (10 mg total) by mouth every morning. 30 tablet 1  . polyethylene glycol (MIRALAX) 17 g packet Take 34 g by mouth 2 (two) times daily. You may increase as needed 14 each 0  .  rosuvastatin (CRESTOR) 10 MG tablet Take 1 tablet (10 mg total) by mouth daily. 90 tablet 3   No current facility-administered medications for this visit.     ALLERGIES: Doxycycline and Sulfonamide derivatives  Family History  Problem Relation Age of Onset  . Hypertension Other   . Stroke Other   . Breast cancer Mother 73  . Breast cancer Paternal Grandmother   . Diabetes Father   . Cancer Maternal Grandmother   . Colon cancer Neg Hx     Social History   Socioeconomic History  . Marital status: Married    Spouse name: Not on file  . Number of children: 1  . Years of education: Not on file  . Highest education level: Not on file  Occupational History  . Occupation: customer service  Tobacco Use  . Smoking status: Current Every Day Smoker    Packs/day: 0.25    Years: 31.00    Pack years: 7.75    Types: Cigarettes  . Smokeless tobacco: Never Used  Substance and Sexual Activity  . Alcohol use: Yes    Alcohol/week: 3.0 standard drinks    Types: 3 Cans of beer per week    Comment: 6 beers per week  . Drug use: No  . Sexual activity: Not Currently    Partners: Male    Birth control/protection: Surgical    Comment: tubal/TAH  Other Topics Concern  . Not on file  Social History Narrative  . Not on file   Social Determinants of Health   Financial Resource Strain:   . Difficulty of Paying Living Expenses: Not on file  Food Insecurity:   . Worried About Charity fundraiser in the Last Year: Not on file  . Ran Out of Food in the Last Year: Not on file  Transportation Needs:   . Lack of Transportation (Medical): Not on file  . Lack of Transportation (Non-Medical): Not on file  Physical Activity:   . Days of Exercise per Week: Not on file  . Minutes of Exercise per Session: Not on file  Stress:   . Feeling of Stress : Not on file  Social Connections:   . Frequency of Communication with Friends and Family: Not on file  . Frequency of Social Gatherings with Friends  and Family: Not on file  . Attends Religious Services: Not on file  . Active Member of Clubs or Organizations: Not on file  . Attends Archivist Meetings: Not on file  . Marital Status: Not on file  Intimate Partner Violence:   . Fear of Current or Ex-Partner: Not on file  .  Emotionally Abused: Not on file  . Physically Abused: Not on file  . Sexually Abused: Not on file    Review of Systems  All other systems reviewed and are negative.   PHYSICAL EXAMINATION:    BP 128/80   Pulse 76   Temp (!) 97 F (36.1 C) (Temporal)   Resp 16   Ht 5' 3.75" (1.619 m)   Wt 171 lb 3.2 oz (77.7 kg)   LMP 12/09/2014 (Exact Date)   BMI 29.62 kg/m     General appearance: alert, cooperative and appears stated age  Pelvic US Uterus absent.  Normal ovaries.  No free fluid.  Bladder with septum versus cyst.   ASSESSMENT  Abdominal bloating, improved with Linzess.  Abnormal bladder ultrasound finding.  Stress, improving without medication.    PLAN  I encouraged her to continue the [place].  We discussed the bladder findings on her ultrasound, which I do not think are causing her bloating symptoms.  Referral made to urology, Dr. Alyson Ingles by request.  We discussed potential for further evaluation of her bladder finding.  Ok to start Paxil if stress increases.  I recommended taking daily and not just prn.  I support her in smoking cessation.  Annual exam in March, 2021.    An After Visit Summary was printed and given to the patient.  _25_____ minutes face to face time of which over 50% was spent in counseling.

## 2019-11-08 DIAGNOSIS — R9341 Abnormal radiologic findings on diagnostic imaging of renal pelvis, ureter, or bladder: Secondary | ICD-10-CM | POA: Insufficient documentation

## 2019-11-13 ENCOUNTER — Encounter: Payer: Self-pay | Admitting: Internal Medicine

## 2019-11-14 MED ORDER — HYDROCODONE-ACETAMINOPHEN 5-325 MG PO TABS: 1.0000 | ORAL_TABLET | Freq: Four times a day (QID) | ORAL | 0 refills | Status: DC | PRN

## 2019-11-15 ENCOUNTER — Telehealth: Payer: Self-pay | Admitting: Gastroenterology

## 2019-11-15 MED ORDER — LINACLOTIDE 145 MCG PO CAPS: 145.0000 ug | ORAL_CAPSULE | Freq: Every day | ORAL | 3 refills | Status: DC

## 2019-11-15 NOTE — Telephone Encounter (Signed)
Refill sent to [facility] on [facility].  Patient seen 10-03-2019.

## 2019-11-20 NOTE — Telephone Encounter (Signed)
Received fax request from [employer], Clinical review, for additional documentation to justify Linzess 157mcg, specifically trial and failure of formulary alternatives.  Last 2 office notes faxed to [phone].  Clinical Review Dept: [phone]

## 2019-11-22 MED ORDER — LUBIPROSTONE 24 MCG PO CAPS: ORAL_CAPSULE | ORAL | 2 refills | Status: DC

## 2019-11-22 NOTE — Telephone Encounter (Signed)
Linzess 170mcg Not approved by insurance. Pt is required to try and fail two formulary alternative drugs. Per Dr. Havery Moros he would like her to try Amitiza 71mcg once to twice daily. Script sent to pharmacy.  Patient informed and she expressed understanding.

## 2019-11-22 NOTE — Addendum Note (Signed)
Addended by: Roetta Sessions on: 11/22/2019 04:52 PM   Modules accepted: Orders

## 2019-11-23 ENCOUNTER — Telehealth: Payer: Self-pay | Admitting: Gastroenterology

## 2019-11-23 MED ORDER — TRULANCE 3 MG PO TABS: 3.0000 mg | ORAL_TABLET | Freq: Every day | ORAL | 3 refills | Status: DC

## 2019-11-23 NOTE — Addendum Note (Signed)
Addended by: Roetta Sessions on: 11/23/2019 12:26 PM   Modules accepted: Orders

## 2019-11-23 NOTE — Telephone Encounter (Signed)
Informed by patient's insurance [phone])  that they will not pay for Amitiza.  The two medications on their formulary for constipation are Trulance 3mg  or lactulose suspension.  She must try and fail both of these before they will pay for other options. Please advise.

## 2019-11-23 NOTE — Addendum Note (Signed)
Addended by: Roetta Sessions on: 11/23/2019 11:41 AM   Modules accepted: Orders

## 2019-11-23 NOTE — Telephone Encounter (Signed)
Okay thanks for letting me know. I would use Trulance 3mg  once daily if that is the case, hopefully she gets the same benefit as Linzess. If she doesn't like it or tolerate it, she can let us know. Can you give her a 30 day supply to start with 3 refills, can give a longer course if it works for her. Thanks

## 2019-11-23 NOTE — Telephone Encounter (Signed)
Pt reported that insurance is not covering Trulance either--$[phone].  Pt stated that she will continue to use Miralax and will come to our office for samples of Linzess if needed.

## 2019-11-23 NOTE — Telephone Encounter (Signed)
Script for Trulance 3mg  once daily sent to pharmacy. Pt informed by VM

## 2019-11-24 NOTE — Telephone Encounter (Signed)
Called pharmacy to confirm. Per pharmacy Insurance indicated Trulance needs PA.  However Insurance indicated this was one of two on their formulary and would not need a PA. ?? PA submitted via CoverMymeds.  Pt was called and informed.  She was offered samples until resolved but she indicated she was not interested.  She is willing to wait and see if we get a response to PA

## 2019-11-28 ENCOUNTER — Telehealth: Payer: Self-pay

## 2019-11-28 NOTE — Telephone Encounter (Signed)
Received request for more information on patient's previously tried inteventions/meds to treat constipation. This is  for PA for Trulance. This was from [employer]. Answered the questions from patient's chart and faxed it back to them at  [phone]

## 2019-11-29 ENCOUNTER — Other Ambulatory Visit: Payer: Self-pay

## 2019-11-29 DIAGNOSIS — E890 Postprocedural hypothyroidism: Secondary | ICD-10-CM

## 2019-11-29 MED ORDER — LEVOTHYROXINE SODIUM 125 MCG PO TABS: 125.0000 ug | ORAL_TABLET | Freq: Every day | ORAL | 3 refills | Status: DC

## 2019-12-01 NOTE — Telephone Encounter (Signed)
Thanks Jan 

## 2019-12-01 NOTE — Telephone Encounter (Signed)
PA for Trulance 3mg  once daily approved January 13  Effective from 11/28/2019 through 11/27/2020. [id number] - Rx #[id number]  Called and informed patient.  She will contact pharmacy to see how much it will be. She did indicate that the omeprazole and Miralax have been working well.arm

## 2019-12-11 ENCOUNTER — Other Ambulatory Visit: Payer: Self-pay

## 2019-12-11 MED ORDER — OMEPRAZOLE 20 MG PO CPDR: 20.0000 mg | DELAYED_RELEASE_CAPSULE | Freq: Every day | ORAL | 1 refills | Status: DC

## 2019-12-11 NOTE — Progress Notes (Signed)
Received Fax request from [facility] for refill on Omeprazole 20mg  once daily, 90 day supply. Patient last seen 10-03-2019. Script sent

## 2019-12-12 ENCOUNTER — Other Ambulatory Visit: Payer: Self-pay

## 2019-12-14 ENCOUNTER — Ambulatory Visit: Payer: BC Managed Care – PPO | Admitting: Endocrinology

## 2019-12-14 ENCOUNTER — Encounter: Payer: Self-pay | Admitting: Endocrinology

## 2019-12-14 ENCOUNTER — Other Ambulatory Visit: Payer: Self-pay

## 2019-12-14 VITALS — BP 140/80 | HR 95 | Ht 63.75 in | Wt 171.6 lb

## 2019-12-14 DIAGNOSIS — C73 Malignant neoplasm of thyroid gland: Secondary | ICD-10-CM | POA: Diagnosis not present

## 2019-12-14 DIAGNOSIS — D171 Benign lipomatous neoplasm of skin and subcutaneous tissue of trunk: Secondary | ICD-10-CM

## 2019-12-14 NOTE — Patient Instructions (Signed)
Blood tests are requested for you today.  We'll let you know about the results.  If there results are good, please come back for a follow-up appointment in 2 years.

## 2019-12-14 NOTE — Progress Notes (Signed)
Subjective:    Patient ID: [name], female    DOB: 1966-04-14, 54 y.o.   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   10/12: adjuvant I-131 rx 158 [facility]. 11/12: post-therapy scan: pos at right neck, nodes vs remnant.   2/13: TG=2.3 (ab neg) 6/13 TG=0.7 (ab not done due to lab error) 2/14: TG undetectable (ab neg).  3/15: TG=undetectable (ab neg). 3/16: TG=0.1 (ab neg). 3/18: TG=0.2 (ab neg). 3/20: TG=0.1 (ab neg).  Postsurgical hypothyroidism (goal TSH is normal, due to long duration of very low TG): She takes synthroid as rx'ed.   Past Medical History:  Diagnosis Date  . ALLERGIC RHINITIS   . Anemia   . ANXIETY   . DEPRESSION   . Fibroid   . HYPERLIPIDEMIA    diet controlled, no meds  . Hypothyroidism   . Lichen sclerosus of female genitalia 2018  . Thyroid carcinoma [facility])     Past Surgical History:  Procedure Laterality Date  . ABDOMINAL HYSTERECTOMY N/A 01/01/2015   Procedure:  SUPRACERVICAL HYSTERECTOMY WITH BILATERAL SALPINGECTOMT  Surgeon: Jamey Reas de Berton Lan, MD;  Location: [facility];  Service: Gynecology;  Laterality: N/A;  . BILATERAL SALPINGECTOMY Bilateral 01/01/2015   Procedure: BILATERAL SALPINGECTOMY        ;  Surgeon: Everardo All Amundson de Berton Lan, MD;  Location: [facility];  Service: Gynecology;  Laterality: Bilateral;  . CESAREAN SECTION  2002   x 1  . TOTAL THYROIDECTOMY  2009/2010  . TUBAL LIGATION    . WISDOM TOOTH EXTRACTION      Social History   Socioeconomic History  . Marital status: Married    Spouse name: Not on file  . Number of children: 1  . Years of education: Not on file  . Highest education level: Not on file  Occupational History  . Occupation: customer service  Tobacco Use  . Smoking status: Current Every Day Smoker    Packs/day: 0.25    Years: 31.00    Pack years: 7.75    Types: Cigarettes   . Smokeless tobacco: Never Used  Substance and Sexual Activity  . Alcohol use: Yes    Alcohol/week: 3.0 standard drinks    Types: 3 Cans of beer per week    Comment: 6 beers per week  . Drug use: No  . Sexual activity: Not Currently    Partners: Male    Birth control/protection: Surgical    Comment: tubal/TAH  Other Topics Concern  . Not on file  Social History Narrative  . Not on file   Social Determinants of Health   Financial Resource Strain:   . Difficulty of Paying Living Expenses: Not on file  Food Insecurity:   . Worried About Charity fundraiser in the Last Year: Not on file  . Ran Out of Food in the Last Year: Not on file  Transportation Needs:   . Lack of Transportation (Medical): Not on file  . Lack of Transportation (Non-Medical): Not on file  Physical Activity:   . Days of Exercise per Week: Not on file  . Minutes of Exercise per Session: Not on file  Stress:   . Feeling of Stress : Not on file  Social Connections:   . Frequency of Communication with Friends and Family: Not on file  . Frequency of Social Gatherings with Friends  and Family: Not on file  . Attends Religious Services: Not on file  . Active Member of Clubs or Organizations: Not on file  . Attends Archivist Meetings: Not on file  . Marital Status: Not on file  Intimate Partner Violence:   . Fear of Current or Ex-Partner: Not on file  . Emotionally Abused: Not on file  . Physically Abused: Not on file  . Sexually Abused: Not on file    Current Outpatient Medications on File Prior to Visit  Medication Sig Dispense Refill  . ALPRAZolam (XANAX) 0.5 MG tablet TAKE 1/2-1 TABLET BY MOUTH DAILY AS NEEDED 30 tablet 5  . betamethasone valerate ointment (VALISONE) 0.1 % Apply to vulva twice daily for 2 weeks prn. May use twice weekly for maintenance dose. 45 g 1  . BIOTIN PO Take 1 tablet by mouth daily.     . fluticasone (FLONASE) 50 MCG/ACT nasal spray Place 1 spray into both nostrils as  needed.    . Omega-3 Fatty Acids (FISH OIL PO) Take 1 capsule by mouth daily.     Marland Kitchen omeprazole (PRILOSEC) 20 MG capsule Take 1 capsule (20 mg total) by mouth daily. 90 capsule 1  . PARoxetine (PAXIL) 10 MG tablet Take 1 tablet (10 mg total) by mouth every morning. 30 tablet 1  . Plecanatide (TRULANCE) 3 MG TABS Take 3 mg by mouth daily. 30 tablet 3  . polyethylene glycol (MIRALAX) 17 g packet Take 34 g by mouth 2 (two) times daily. You may increase as needed 14 each 0  . rosuvastatin (CRESTOR) 10 MG tablet Take 1 tablet (10 mg total) by mouth daily. 90 tablet 3   No current facility-administered medications on file prior to visit.    Allergies  Allergen Reactions  . Doxycycline Nausea Only  . Sulfonamide Derivatives Itching    Family History  Problem Relation Age of Onset  . Hypertension Other   . Stroke Other   . Breast cancer Mother 39  . Breast cancer Paternal Grandmother   . Diabetes Father   . Cancer Maternal Grandmother   . Colon cancer Neg Hx     BP 140/80 (BP Location: Left Arm, Patient Position: Sitting, Cuff Size: Normal)   Pulse 95   Ht 5' 3.75" (1.619 m)   Wt 171 lb 9.6 oz (77.8 kg)   LMP 12/09/2014 (Exact Date)   SpO2 98%   BMI 29.69 kg/m    Review of Systems No neck swelling    Objective:   Physical Exam VITAL SIGNS:  See vs page GENERAL: no distress Neck: a healed scar is present.  I do not appreciate a nodule in the thyroid or elsewhere in the neck.    Lab Results  Component Value Date   TSH 0.09 (L) 12/14/2019      Assessment & Plan:  PTC: recheck TG Hypothyroidism: overreplaced: I have sent a prescription to your pharmacy, to reduce synthroid.   Patient Instructions  Blood tests are requested for you today.  We'll let you know about the results.  If there results are good, please come back for a follow-up appointment in 2 years.

## 2019-12-15 ENCOUNTER — Encounter: Payer: Self-pay | Admitting: Internal Medicine

## 2019-12-15 LAB — THYROGLOBULIN LEVEL: Thyroglobulin: 0.2 ng/mL — ABNORMAL LOW

## 2019-12-15 LAB — THYROGLOBULIN ANTIBODY: Thyroglobulin Ab: <1 IU/mL (ref <=1)

## 2019-12-15 LAB — T4, FREE: Free T4: 1.4 ng/dL (ref 0.60–1.60)

## 2019-12-15 LAB — TSH: TSH: 0.09 u[IU]/mL — ABNORMAL LOW (ref 0.35–4.50)

## 2019-12-15 MED ORDER — HYDROCODONE-ACETAMINOPHEN 5-325 MG PO TABS: 1.0000 | ORAL_TABLET | Freq: Four times a day (QID) | ORAL | 0 refills | Status: DC | PRN

## 2019-12-15 MED ORDER — LEVOTHYROXINE SODIUM 112 MCG PO TABS: 112.0000 ug | ORAL_TABLET | Freq: Every day | ORAL | 3 refills | Status: DC

## 2019-12-15 NOTE — Telephone Encounter (Signed)
Done erx 

## 2019-12-21 DIAGNOSIS — R102 Pelvic and perineal pain: Secondary | ICD-10-CM | POA: Diagnosis not present

## 2019-12-22 ENCOUNTER — Telehealth: Payer: Self-pay

## 2019-12-22 MED ORDER — PAROXETINE HCL 10 MG PO TABS: 10.0000 mg | ORAL_TABLET | ORAL | 1 refills | Status: DC

## 2019-12-22 NOTE — Telephone Encounter (Signed)
Please schedule a 6 week recheck appointment with me if the patient has started on Paxil.

## 2019-12-22 NOTE — Telephone Encounter (Signed)
Medication refill request: paxil 10mg  Last AEX:  01-02-2019 Next AEX: 01-24-2020 Last MMG (if hormonal medication request): n/a Refill authorized: please approve until aex if appropriate

## 2019-12-25 NOTE — Telephone Encounter (Signed)
Spoke with patient. Patient did start Paxil, is doing well with medication, would like to continue. Patient is scheduled for AEX on 01/24/20, will f/u with Dr. Quincy Simmonds at this time. Patient is aware of refill.   Routing to provider for final review. Patient is agreeable to disposition. Will close encounter.

## 2019-12-25 NOTE — Telephone Encounter (Signed)
This may need to be a mychart video visit

## 2020-01-19 ENCOUNTER — Other Ambulatory Visit: Payer: Self-pay

## 2020-01-19 ENCOUNTER — Telehealth: Payer: Self-pay | Admitting: Endocrinology

## 2020-01-19 DIAGNOSIS — C73 Malignant neoplasm of thyroid gland: Secondary | ICD-10-CM

## 2020-01-19 MED ORDER — LEVOTHYROXINE SODIUM 112 MCG PO TABS: 112.0000 ug | ORAL_TABLET | Freq: Every day | ORAL | 0 refills | Status: DC

## 2020-01-19 NOTE — Telephone Encounter (Signed)
Patient called stating she has not gotten her mail package for her levothyroxine and is requesting a small prescription just to last her for a week or so until she gets her package in the mail.  Pharmacy -   [facility] [address] Phone:  [phone]  Fax:  [phone]     Patient ph# [phone]

## 2020-01-19 NOTE — Telephone Encounter (Signed)
Outpatient Medication Detail   Disp Refills Start End   levothyroxine (SYNTHROID) 112 MCG tablet 7 tablet 0 01/19/2020    Sig - Route: Take 1 tablet (112 mcg total) by mouth daily before breakfast. PT REQUESTING 1 WEEK SUPPLY. HAS NOT RECEIVED FROM MAIL ORDER. MAY FILL WITH ANY AVAILABLE MANUFACTURER - Oral   Sent to pharmacy as: levothyroxine (SYNTHROID) 112 MCG tablet   E-Prescribing Status: Receipt confirmed by pharmacy (01/19/2020  1:41 PM EST)

## 2020-01-23 ENCOUNTER — Other Ambulatory Visit: Payer: Self-pay

## 2020-01-24 ENCOUNTER — Telehealth: Payer: Self-pay | Admitting: Obstetrics and Gynecology

## 2020-01-24 ENCOUNTER — Ambulatory Visit (INDEPENDENT_AMBULATORY_CARE_PROVIDER_SITE_OTHER): Payer: BC Managed Care – PPO | Admitting: Obstetrics and Gynecology

## 2020-01-24 ENCOUNTER — Encounter: Payer: Self-pay | Admitting: Obstetrics and Gynecology

## 2020-01-24 VITALS — BP 138/78 | HR 84 | Temp 97.1°F | Resp 16 | Ht 65.0 in | Wt 171.4 lb

## 2020-01-24 DIAGNOSIS — Z01419 Encounter for gynecological examination (general) (routine) without abnormal findings: Secondary | ICD-10-CM

## 2020-01-24 MED ORDER — PAROXETINE HCL 10 MG PO TABS: 10.0000 mg | ORAL_TABLET | ORAL | 3 refills | Status: DC

## 2020-01-24 MED ORDER — BETAMETHASONE VALERATE 0.1 % EX OINT: TOPICAL_OINTMENT | CUTANEOUS | 1 refills | Status: DC

## 2020-01-24 NOTE — Progress Notes (Signed)
54 y.o. [id number] Married Caucasian female here for annual exam.    Doing well on Paxil.   Wants to stop smoking and loose weight.   She does have some vulvar irritation with voiding.  Using the clobetasol ointment, and this is helping.   Patient went to urology and had a pelvic US which was normal.  She was scheduled for a cystoscopy, but she is uncertain if she really needs this.  PCP:  Cathlean Cower, MD  Patient's last menstrual period was 12/09/2014 (exact date).           Sexually active: No. --no desire The current method of family planning is status post hysterectomy--ovaries remain.    Exercising: No.   Smoker:  yes  Health Maintenance: Pap: 01-02-19 Neg:Neg HR HPV, 12-29-17 Neg:Neg HR HPV, 11-06-16 Neg:Neg HR HPV History of abnormal Pap:  Yes, Pap 09/28/13 - ASCUS and positive HR HPV, Colpo12/10/14 -ECC Benign. Pap 04/11/15 - ASCUS and neg HR HPV.  Pap 10/01/14 - ASCUS, neg HR HPV. MMG: 05-22-19 3D/Neg/density B/BiRads1 Colonoscopy: 04/14/16-3 polyps. Repeat 5 yrs  BMD:   n/a  Result  n/a TDaP:  2014 Gardasil:   no HIV:09-15-17 NR Hep C:Never Screening Labs:  PCP.    reports that she has been smoking cigarettes. She has a 7.75 pack-year smoking history. She has never used smokeless tobacco. She reports current alcohol use of about 3.0 standard drinks of alcohol per week. She reports that she does not use drugs.  Past Medical History:  Diagnosis Date  . ALLERGIC RHINITIS   . Anemia   . ANXIETY   . DEPRESSION   . Fibroid   . HYPERLIPIDEMIA    diet controlled, no meds  . Hypothyroidism   . Lichen sclerosus of female genitalia 2018  . Thyroid carcinoma [facility])     Past Surgical History:  Procedure Laterality Date  . ABDOMINAL HYSTERECTOMY N/A 01/01/2015   Procedure:  SUPRACERVICAL HYSTERECTOMY WITH BILATERAL SALPINGECTOMT  Surgeon: Jamey Reas de Berton Lan, MD;  Location: [facility];  Service: Gynecology;  Laterality: N/A;  . BILATERAL SALPINGECTOMY  Bilateral 01/01/2015   Procedure: BILATERAL SALPINGECTOMY        ;  Surgeon: Everardo All Amundson de Berton Lan, MD;  Location: [place];  Service: Gynecology;  Laterality: Bilateral;  . CESAREAN SECTION  2002   x 1  . TOTAL THYROIDECTOMY  2009/2010  . TUBAL LIGATION    . WISDOM TOOTH EXTRACTION      Current Outpatient Medications  Medication Sig Dispense Refill  . ALPRAZolam (XANAX) 0.5 MG tablet TAKE 1/2-1 TABLET BY MOUTH DAILY AS NEEDED 30 tablet 5  . betamethasone valerate ointment (VALISONE) 0.1 % Apply to vulva twice daily for 2 weeks prn. May use twice weekly for maintenance dose. 45 g 1  . BIOTIN PO Take 1 tablet by mouth daily.     Marland Kitchen HYDROcodone-acetaminophen (NORCO/VICODIN) 5-325 MG tablet Take 1 tablet by mouth every 6 (six) hours as needed. 30 tablet 0  . levothyroxine (SYNTHROID) 112 MCG tablet Take 1 tablet (112 mcg total) by mouth daily before breakfast. PT REQUESTING 1 WEEK SUPPLY. HAS NOT RECEIVED FROM MAIL ORDER. MAY FILL WITH ANY AVAILABLE MANUFACTURER 7 tablet 0  . Omega-3 Fatty Acids (FISH OIL PO) Take 1 capsule by mouth daily.     Marland Kitchen omeprazole (PRILOSEC) 20 MG capsule Take 1 capsule (20 mg total) by mouth daily. 90 capsule 1  . PARoxetine (PAXIL) 10 MG tablet Take 1 tablet (10 mg  total) by mouth every morning. 30 tablet 1  . polyethylene glycol (MIRALAX) 17 g packet Take 34 g by mouth 2 (two) times daily. You may increase as needed 14 each 0  . rosuvastatin (CRESTOR) 10 MG tablet Take 1 tablet (10 mg total) by mouth daily. 90 tablet 3   No current facility-administered medications for this visit.    Family History  Problem Relation Age of Onset  . Hypertension Other   . Stroke Other   . Breast cancer Mother 58  . Breast cancer Paternal Grandmother   . Diabetes Father   . Cancer Maternal Grandmother   . Colon cancer Neg Hx     Review of Systems  All other systems reviewed and are negative.   Exam:   BP 138/78   Pulse 84   Temp (!) 97.1 F (36.2 C)  (Temporal)   Resp 16   Ht 5\' 5"  (1.651 m)   Wt 171 lb 6.4 oz (77.7 kg)   LMP 12/09/2014 (Exact Date)   BMI 28.52 kg/m     General appearance: alert, cooperative and appears stated age Head: normocephalic, without obvious abnormality, atraumatic Neck: no adenopathy, supple, symmetrical, trachea midline and thyroid normal to inspection and palpation Lungs: clear to auscultation bilaterally Breasts: normal appearance, no masses or tenderness, No nipple retraction or dimpling, No nipple discharge or bleeding, No axillary adenopathy Heart: regular rate and rhythm Abdomen: soft, non-tender; no masses, no organomegaly Extremities: extremities normal, atraumatic, no cyanosis or edema Skin: skin color, texture, turgor normal. No rashes or lesions Lymph nodes: cervical, supraclavicular, and axillary nodes normal. Neurologic: grossly normal  Pelvic: External genitalia:  Hypopigmentation of the bilateral labia minora and the perineum.  Perineum with a small slit in the skin.               No abnormal inguinal nodes palpated.              Urethra:  normal appearing urethra with no masses, tenderness or lesions              Bartholins and Skenes: normal                 Vagina: normal appearing vagina with normal color and discharge, no lesions              Cervix: no lesions              Pap taken: No. Bimanual Exam:  Uterus:  normal size, contour, position, consistency, mobility, non-tender              Adnexa: no mass, fullness, tenderness              Rectal exam: Yes.  .  Confirms.              Anus:  normal sphincter tone, no lesions  Chaperone was present for exam.  Assessment:   Well woman visit with normal exam. Hx bladder cyst versus septum seen on [place] in December 2022. Had supracervical hysterectomy 01/01/15. Piece of cervix was inadvertently left at time of hysterectomy. ASCUS and Positive HR HPV in 2014 with negative colpo.Hx recurrent ASCUS paps. Smoker. Lichen  sclerosus. Hx thyroid cancer.  FHbreast cancer.  Life stress.  Doing well on Paxil.  Plan: Mammogram screening discussed. Self breast awareness reviewed. Pap and HR HPV in 2023.  Guidelines for Calcium, Vitamin D, regular exercise program including cardiovascular and weight bearing exercise. Refill of Paxil 10 mg daily.   Refill  of Clobetasol ointment.  I encouraged her to do twice weekly application for a maintenance dose.  Smoking cessation discussed.  She will reach out to urology regarding potential cystoscopy to understand if this is still needed. Labs with PCP. Follow up annually and prn.   After visit summary provided.

## 2020-01-24 NOTE — Patient Instructions (Signed)

## 2020-01-24 NOTE — Telephone Encounter (Signed)
Please place in pap recall for 2023.   She has history of ASCUS for several years in a row.  At one point she had positive HR HPV.

## 2020-01-24 NOTE — Telephone Encounter (Signed)
36 month recall entered.

## 2020-01-28 ENCOUNTER — Other Ambulatory Visit: Payer: Self-pay | Admitting: Endocrinology

## 2020-01-28 DIAGNOSIS — C73 Malignant neoplasm of thyroid gland: Secondary | ICD-10-CM

## 2020-01-29 ENCOUNTER — Other Ambulatory Visit: Payer: Self-pay | Admitting: Internal Medicine

## 2020-01-29 NOTE — Telephone Encounter (Signed)
Please refill as per office routine med refill policy (all routine meds refilled for 3 mo or monthly per pt preference up to one year from last visit, then month to month grace period for 3 mo, then further med refills will have to be denied)  

## 2020-01-30 ENCOUNTER — Encounter: Payer: Self-pay | Admitting: Obstetrics and Gynecology

## 2020-01-30 ENCOUNTER — Telehealth: Payer: Self-pay | Admitting: Obstetrics and Gynecology

## 2020-01-30 NOTE — Telephone Encounter (Signed)
[name], [place] [facility]  Phone Number: [phone]  Good morning Dr Quincy Simmonds did you get my pap results back yet was just checking    have a good one

## 2020-01-30 NOTE — Telephone Encounter (Signed)
Hx of abnormal pap.  Per review of 01/24/20 AEX notes no pap completed. Next PAP 2023.   Dr. Quincy Simmonds -please review.

## 2020-02-01 NOTE — Telephone Encounter (Signed)
No pap or HR HPV testing was done at her annual exam.  She will do this in 2023, which is more conservative than the current ASCCP guidelines, which recommend every 5 years for her cervical cancer screening.  Her 5 year risk of CIN 3+ (severe cervical dysplasia) is 0.03%, which is significantly low.

## 2020-02-01 NOTE — Telephone Encounter (Signed)
Left message to call Kasidi Shanker, RN at [facility] [phone].   

## 2020-02-01 NOTE — Telephone Encounter (Signed)
Spoke with patient, advised as seen below per Dr. Silva.  Patient verbalizes understanding and is agreeable.  Encounter closed.  

## 2020-02-06 ENCOUNTER — Encounter: Payer: Self-pay | Admitting: Internal Medicine

## 2020-02-13 ENCOUNTER — Encounter: Payer: Self-pay | Admitting: Internal Medicine

## 2020-02-13 MED ORDER — HYDROCODONE-ACETAMINOPHEN 5-325 MG PO TABS: 1.0000 | ORAL_TABLET | Freq: Four times a day (QID) | ORAL | 0 refills | Status: DC | PRN

## 2020-02-13 NOTE — Telephone Encounter (Signed)
Done erx 

## 2020-02-19 ENCOUNTER — Other Ambulatory Visit: Payer: Self-pay | Admitting: Obstetrics and Gynecology

## 2020-03-27 ENCOUNTER — Encounter: Payer: Self-pay | Admitting: Internal Medicine

## 2020-03-27 MED ORDER — HYDROCODONE-ACETAMINOPHEN 5-325 MG PO TABS: 1.0000 | ORAL_TABLET | Freq: Four times a day (QID) | ORAL | 0 refills | Status: DC | PRN

## 2020-05-10 ENCOUNTER — Encounter: Payer: Self-pay | Admitting: Internal Medicine

## 2020-05-10 MED ORDER — HYDROCODONE-ACETAMINOPHEN 5-325 MG PO TABS: 1.0000 | ORAL_TABLET | Freq: Four times a day (QID) | ORAL | 0 refills | Status: DC | PRN

## 2020-06-11 ENCOUNTER — Encounter: Payer: Self-pay | Admitting: Internal Medicine

## 2020-06-12 MED ORDER — HYDROCODONE-ACETAMINOPHEN 5-325 MG PO TABS: 1.0000 | ORAL_TABLET | Freq: Four times a day (QID) | ORAL | 0 refills | Status: DC | PRN

## 2020-06-24 ENCOUNTER — Other Ambulatory Visit: Payer: Self-pay | Admitting: Internal Medicine

## 2020-06-24 NOTE — Telephone Encounter (Signed)
Done erx 

## 2020-07-19 MED ORDER — HYDROCODONE-ACETAMINOPHEN 5-325 MG PO TABS: 1.0000 | ORAL_TABLET | Freq: Four times a day (QID) | ORAL | 0 refills | Status: DC | PRN

## 2020-07-19 MED ORDER — ALPRAZOLAM 0.5 MG PO TABS: ORAL_TABLET | ORAL | 2 refills | Status: DC

## 2020-07-19 NOTE — Addendum Note (Signed)
Addended by: Biagio Borg on: 07/19/2020 07:33 PM   Modules accepted: Orders

## 2020-07-29 ENCOUNTER — Telehealth: Payer: Self-pay

## 2020-07-29 NOTE — Telephone Encounter (Signed)
Spoke with pt. Pt stating wanting to know what blood type before getting Covid vaccine. Pt advised no type and screen on file here at [facility], advised can call PCP or check with OB after birth of son. Pt agreeable and verbalized understanding. Thankful for advice. Encounter closed.

## 2020-07-29 NOTE — Telephone Encounter (Signed)
Patient is calling in regards to wanting to know blood type.

## 2020-08-14 MED ORDER — HYDROCODONE-ACETAMINOPHEN 5-325 MG PO TABS: 1.0000 | ORAL_TABLET | Freq: Four times a day (QID) | ORAL | 0 refills | Status: DC | PRN

## 2020-08-14 NOTE — Addendum Note (Signed)
Addended by: Biagio Borg on: 08/14/2020 09:06 PM   Modules accepted: Orders

## 2020-08-15 ENCOUNTER — Telehealth: Payer: Self-pay | Admitting: Internal Medicine

## 2020-08-15 MED ORDER — HYDROCODONE-ACETAMINOPHEN 5-325 MG PO TABS: 1.0000 | ORAL_TABLET | Freq: Four times a day (QID) | ORAL | 0 refills | Status: DC | PRN

## 2020-08-15 NOTE — Addendum Note (Signed)
Addended by: Biagio Borg on: 08/15/2020 12:42 PM   Modules accepted: Orders

## 2020-08-15 NOTE — Telephone Encounter (Signed)
Patient called and sent a message to Dr. Jenny Reichmann about a refill request for HYDROcodone-acetaminophen (NORCO/VICODIN) 5-325 MG tablet  And she said that she saw the message from him where he said "ok this is done" but she went to her pharmacy and they said that they hadn't received anything yet. She said that she was going out of town tomorrow morning around 11.

## 2020-08-16 NOTE — Telephone Encounter (Signed)
Spoke with a Customer service manager at [facility] [employer] and she has confirmed that pt did pick up her pain meds on yesterday.  ** HYDROcodone-acetaminophen (NORCO/VICODIN) 5-325 MG tablet

## 2020-09-05 DIAGNOSIS — L918 Other hypertrophic disorders of the skin: Secondary | ICD-10-CM | POA: Diagnosis not present

## 2020-09-05 DIAGNOSIS — L820 Inflamed seborrheic keratosis: Secondary | ICD-10-CM | POA: Diagnosis not present

## 2020-09-05 DIAGNOSIS — L814 Other melanin hyperpigmentation: Secondary | ICD-10-CM | POA: Diagnosis not present

## 2020-09-05 DIAGNOSIS — D225 Melanocytic nevi of trunk: Secondary | ICD-10-CM | POA: Diagnosis not present

## 2020-09-05 DIAGNOSIS — D2271 Melanocytic nevi of right lower limb, including hip: Secondary | ICD-10-CM | POA: Diagnosis not present

## 2020-09-12 ENCOUNTER — Telehealth: Payer: Self-pay | Admitting: Endocrinology

## 2020-09-12 DIAGNOSIS — C73 Malignant neoplasm of thyroid gland: Secondary | ICD-10-CM

## 2020-09-12 NOTE — Telephone Encounter (Signed)
Please verify if pt taking 112 mcg or 125 mcg. Please advise

## 2020-09-12 NOTE — Telephone Encounter (Signed)
112 

## 2020-09-12 NOTE — Telephone Encounter (Signed)
[facility] has all patient's prescriptions coming there now so they are wanting to make sure the dose was correct for this patient for Levothyroxine - they have a 125 mcg and also a 112 mcg in system, which is the one patient is currently taking? Please give Santiago Glad a call back at pharmacy at [phone]

## 2020-09-16 MED ORDER — LEVOTHYROXINE SODIUM 112 MCG PO TABS: 112.0000 ug | ORAL_TABLET | Freq: Every day | ORAL | 0 refills | Status: DC

## 2020-09-16 NOTE — Telephone Encounter (Signed)
Refill for levothyroxine [facility]mcg [facility]

## 2020-09-17 ENCOUNTER — Telehealth: Payer: Self-pay | Admitting: *Deleted

## 2020-09-17 ENCOUNTER — Encounter: Payer: Self-pay | Admitting: Obstetrics and Gynecology

## 2020-09-17 NOTE — Telephone Encounter (Signed)
Doolen, Jetaime S  P [facility] Good morning Dr Quincy Simmonds can you refill that ointment for me that starts with a B lol    Thanks

## 2020-09-17 NOTE — Telephone Encounter (Signed)
Spoke with patient. Patient is requesting RX for betamethasone ointment. Last RX sent on 01/24/20 with one refill. Patient states she never had RX refilled. Advised patient to f/u with pharmacy for refill, return call to office if any additional assistance is needed. Questions answered, patient thankful for call.   Encounter closed.

## 2020-09-17 NOTE — Telephone Encounter (Signed)
Left message to call Sharee Pimple, RN at [facility].    Last AEX 01/24/20 w/ Dr. Quincy Simmonds Hx of Lichen Sclerosus Call placed to confirm RX requested and pharmacy.  Has she used the refill from Rx sent on 01/24/20?

## 2020-09-17 NOTE — Telephone Encounter (Signed)
Patient is returning call.  °

## 2020-09-17 NOTE — Telephone Encounter (Signed)
See telephone encounter dated 09/17/20.   Encounter closed.

## 2020-09-18 ENCOUNTER — Telehealth: Payer: Self-pay | Admitting: Internal Medicine

## 2020-09-18 MED ORDER — HYDROCODONE-ACETAMINOPHEN 5-325 MG PO TABS: 1.0000 | ORAL_TABLET | Freq: Four times a day (QID) | ORAL | 0 refills | Status: DC | PRN

## 2020-09-18 NOTE — Telephone Encounter (Signed)
Patient is requesting a med refill for HYDROcodone-acetaminophen (NORCO/VICODIN) 5-325 MG tablet  It can be sent to [place] [place]    Please call pt when rx is done [phone]

## 2020-09-18 NOTE — Telephone Encounter (Signed)
Sent to Dr. John. 

## 2020-09-18 NOTE — Telephone Encounter (Signed)
Done erx 

## 2020-09-25 ENCOUNTER — Encounter: Payer: BC Managed Care – PPO | Admitting: Internal Medicine

## 2020-10-23 ENCOUNTER — Other Ambulatory Visit: Payer: Self-pay

## 2020-10-24 ENCOUNTER — Encounter: Payer: Self-pay | Admitting: Internal Medicine

## 2020-10-24 ENCOUNTER — Other Ambulatory Visit (INDEPENDENT_AMBULATORY_CARE_PROVIDER_SITE_OTHER): Payer: BC Managed Care – PPO

## 2020-10-24 ENCOUNTER — Ambulatory Visit (INDEPENDENT_AMBULATORY_CARE_PROVIDER_SITE_OTHER): Payer: BC Managed Care – PPO | Admitting: Internal Medicine

## 2020-10-24 VITALS — BP 140/86 | HR 85 | Temp 98.5°F | Ht 65.0 in | Wt 170.0 lb

## 2020-10-24 DIAGNOSIS — Z0001 Encounter for general adult medical examination with abnormal findings: Secondary | ICD-10-CM

## 2020-10-24 DIAGNOSIS — F17200 Nicotine dependence, unspecified, uncomplicated: Secondary | ICD-10-CM

## 2020-10-24 DIAGNOSIS — R739 Hyperglycemia, unspecified: Secondary | ICD-10-CM | POA: Diagnosis not present

## 2020-10-24 DIAGNOSIS — Z0000 Encounter for general adult medical examination without abnormal findings: Secondary | ICD-10-CM | POA: Diagnosis not present

## 2020-10-24 DIAGNOSIS — G8929 Other chronic pain: Secondary | ICD-10-CM

## 2020-10-24 DIAGNOSIS — Z23 Encounter for immunization: Secondary | ICD-10-CM | POA: Diagnosis not present

## 2020-10-24 DIAGNOSIS — E559 Vitamin D deficiency, unspecified: Secondary | ICD-10-CM

## 2020-10-24 DIAGNOSIS — C73 Malignant neoplasm of thyroid gland: Secondary | ICD-10-CM

## 2020-10-24 LAB — CBC WITH DIFFERENTIAL/PLATELET
Basophils Absolute: 0.1 10*3/uL (ref 0.0–0.1)
Basophils Relative: 1.3 % (ref 0.0–3.0)
Eosinophils Absolute: 0.2 10*3/uL (ref 0.0–0.7)
Eosinophils Relative: 1.7 % (ref 0.0–5.0)
HCT: 43.8 % (ref 36.0–46.0)
Hemoglobin: 15 g/dL (ref 12.0–15.0)
Lymphocytes Relative: 42.4 % (ref 12.0–46.0)
Lymphs Abs: 4.3 10*3/uL — ABNORMAL HIGH (ref 0.7–4.0)
MCHC: 34.3 g/dL (ref 30.0–36.0)
MCV: 92.3 fl (ref 78.0–100.0)
Monocytes Absolute: 0.8 10*3/uL (ref 0.1–1.0)
Monocytes Relative: 8 % (ref 3.0–12.0)
Neutro Abs: 4.7 10*3/uL (ref 1.4–7.7)
Neutrophils Relative %: 46.6 % (ref 43.0–77.0)
Platelets: 321 10*3/uL (ref 150.0–400.0)
RBC: 4.74 Mil/uL (ref 3.87–5.11)
RDW: 12.8 % (ref 11.5–15.5)
WBC: 10.1 10*3/uL (ref 4.0–10.5)

## 2020-10-24 MED ORDER — ROSUVASTATIN CALCIUM 10 MG PO TABS
ORAL_TABLET | ORAL | 3 refills | Status: DC
Start: 2020-10-24 — End: 2022-02-23

## 2020-10-24 MED ORDER — ALPRAZOLAM 0.5 MG PO TABS
ORAL_TABLET | ORAL | 2 refills | Status: DC
Start: 2020-10-24 — End: 2021-07-15

## 2020-10-24 MED ORDER — PAROXETINE HCL 10 MG PO TABS
10.0000 mg | ORAL_TABLET | ORAL | 3 refills | Status: DC
Start: 2020-10-24 — End: 2021-04-17

## 2020-10-24 MED ORDER — HYDROCODONE-ACETAMINOPHEN 5-325 MG PO TABS: 1.0000 | ORAL_TABLET | Freq: Four times a day (QID) | ORAL | 0 refills | Status: DC | PRN

## 2020-10-24 NOTE — Patient Instructions (Addendum)
Please stop smoking  You have signed the pain contract today  Please continue all other medications as before, and refills have been done if requested.  Please have the pharmacy call with any other refills you may need.  Please continue your efforts at being more active, low cholesterol diet, and weight control.  You are otherwise up to date with prevention measures today.  Please keep your appointments with your specialists as you may have planned  Please go to the LAB at the blood drawing area for the tests to be done - at the [place] will be contacted by phone if any changes need to be made immediately.  Otherwise, you will receive a letter about your results with an explanation, but please check with MyChart first.  Please remember to sign up for MyChart if you have not done so, as this will be important to you in the future with finding out test results, communicating by private email, and scheduling acute appointments online when needed.  Please make an Appointment to return in 6 months, or sooner if needed

## 2020-10-24 NOTE — Progress Notes (Signed)
Subjective:    Patient ID: [name], female    DOB: 1965/11/22, 54 y.o.   MRN: [id number]  HPI  Here for wellness and f/u;  Overall doing ok;  Pt denies Chest pain, worsening SOB, DOE, wheezing, orthopnea, PND, worsening LE edema, palpitations, dizziness or syncope.  Pt denies neurological change such as new headache, facial or extremity weakness.  Pt denies polydipsia, polyuria, or low sugar symptoms. Pt states overall good compliance with treatment and medications, good tolerability, and has been trying to follow appropriate diet.  Pt denies worsening depressive symptoms, suicidal ideation or panic. No fever, night sweats, wt loss, loss of appetite, or other constitutional symptoms.  Pt states good ability with ADL's, has low fall risk, home safety reviewed and adequate, no other significant changes in hearing or vision, and only occasionally active with exercise.  Plans to start more exercise soon.  Still smoking, not ready to quit Wt Readings from Last 3 Encounters:  10/24/20 170 lb (77.1 kg)  01/24/20 171 lb 6.4 oz (77.7 kg)  12/14/19 171 lb 9.6 oz (77.8 kg)   Past Medical History:  Diagnosis Date  . ALLERGIC RHINITIS   . Anemia   . ANXIETY   . DEPRESSION   . Fibroid   . HYPERLIPIDEMIA    diet controlled, no meds  . Hypothyroidism   . Lichen sclerosus of female genitalia 2018  . Thyroid carcinoma [facility])    Past Surgical History:  Procedure Laterality Date  . ABDOMINAL HYSTERECTOMY N/A 01/01/2015   Procedure:  SUPRACERVICAL HYSTERECTOMY WITH BILATERAL SALPINGECTOMT  Surgeon: Jamey Reas de Berton Lan, MD;  Location: [place] [facility];  Service: Gynecology;  Laterality: N/A;  . BILATERAL SALPINGECTOMY Bilateral 01/01/2015   Procedure: BILATERAL SALPINGECTOMY        ;  Surgeon: Everardo All Amundson de Berton Lan, MD;  Location: [place] [facility];  Service: Gynecology;  Laterality: Bilateral;  . CESAREAN SECTION  2002   x 1  . TOTAL THYROIDECTOMY  2009/2010  . TUBAL LIGATION    .  WISDOM TOOTH EXTRACTION      reports that she has been smoking cigarettes. She has a 7.75 pack-year smoking history. She has never used smokeless tobacco. She reports current alcohol use of about 3.0 standard drinks of alcohol per week. She reports that she does not use drugs. family history includes Breast cancer in her paternal grandmother; Breast cancer (age of onset: 9) in her mother; Cancer in her maternal grandmother; Diabetes in her father; Hypertension in an other family member; Stroke in an other family member. Allergies  Allergen Reactions  . Doxycycline Nausea Only  . Sulfonamide Derivatives Itching   Current Outpatient Medications on File Prior to Visit  Medication Sig Dispense Refill  . betamethasone valerate ointment (VALISONE) 0.1 % Apply to vulva twice daily for 2 weeks prn. May use twice weekly for maintenance dose. 45 g 1  . BIOTIN PO Take 1 tablet by mouth daily.     Marland Kitchen levothyroxine (SYNTHROID) 125 MCG tablet Take 125 mcg by mouth daily.    . Omega-3 Fatty Acids (FISH OIL PO) Take 1 capsule by mouth daily.     Marland Kitchen omeprazole (PRILOSEC) 20 MG capsule Take 1 capsule (20 mg total) by mouth daily. 90 capsule 1  . polyethylene glycol (MIRALAX) 17 g packet Take 34 g by mouth 2 (two) times daily. You may increase as needed 14 each 0   No current facility-administered medications on file prior to visit.  Review of Systems All otherwise neg per pt    Objective:   Physical Exam BP 140/86 (BP Location: Left Arm, Patient Position: Sitting, Cuff Size: Large)   Pulse 85   Temp 98.5 F (36.9 C) (Oral)   Ht 5\' 5"  (1.651 m)   Wt 170 lb (77.1 kg)   LMP 12/09/2014 (Exact Date)   SpO2 97%   BMI 28.29 kg/m  VS noted,  Constitutional: Pt appears in NAD HENT: Head: NCAT.  Right Ear: External ear normal.  Left Ear: External ear normal.  Eyes: . Pupils are equal, round, and reactive to light. Conjunctivae and EOM are normal Nose: without d/c or deformity Neck: Neck supple. Gross  normal ROM Cardiovascular: Normal rate and regular rhythm.   Pulmonary/Chest: Effort normal and breath sounds without rales or wheezing.  Abd:  Soft, NT, ND, + BS, no organomegaly Neurological: Pt is alert. At baseline orientation, motor grossly intact Skin: Skin is warm. No rashes, other new lesions, no LE edema Psychiatric: Pt behavior is normal without agitation  All otherwise neg per pt Lab Results  Component Value Date   WBC 10.1 10/24/2020   HGB 15.0 10/24/2020   HCT 43.8 10/24/2020   PLT 321.0 10/24/2020   GLUCOSE 85 10/24/2020   CHOL 154 10/24/2020   TRIG 73.0 10/24/2020   HDL 61.80 10/24/2020   LDLDIRECT 131.0 03/10/2007   LDLCALC 78 10/24/2020   ALT 18 10/24/2020   AST 18 10/24/2020   NA 139 10/24/2020   K 3.9 10/24/2020   CL 103 10/24/2020   CREATININE 0.72 10/24/2020   BUN 17 10/24/2020   CO2 26 10/24/2020   TSH 0.09 (L) 12/14/2019   HGBA1C 5.8 10/24/2020       Assessment & Plan:

## 2020-10-25 ENCOUNTER — Encounter: Payer: Self-pay | Admitting: Internal Medicine

## 2020-10-25 DIAGNOSIS — G8929 Other chronic pain: Secondary | ICD-10-CM

## 2020-10-25 HISTORY — DX: Other chronic pain: G89.29

## 2020-10-25 LAB — URINALYSIS, ROUTINE W REFLEX MICROSCOPIC
Bilirubin Urine: NEGATIVE
Hgb urine dipstick: NEGATIVE
Ketones, ur: NEGATIVE
Leukocytes,Ua: NEGATIVE
Nitrite: NEGATIVE
RBC / HPF: NONE SEEN (ref 0–?)
Specific Gravity, Urine: <=1.005 — AB (ref 1.000–1.030)
Total Protein, Urine: NEGATIVE
Urine Glucose: NEGATIVE
Urobilinogen, UA: 0.2 (ref 0.0–1.0)
pH: 6 (ref 5.0–8.0)

## 2020-10-25 LAB — LIPID PANEL
Cholesterol: 154 mg/dL (ref 0–200)
HDL: 61.8 mg/dL (ref >39.00)
LDL Cholesterol: 78 mg/dL (ref 0–99)
NonHDL: 92.61
Total CHOL/HDL Ratio: 2
Triglycerides: 73 mg/dL (ref 0.0–149.0)
VLDL: 14.6 mg/dL (ref 0.0–40.0)

## 2020-10-25 LAB — HEPATIC FUNCTION PANEL
ALT: 18 U/L (ref 0–35)
AST: 18 U/L (ref 0–37)
Albumin: 4.4 g/dL (ref 3.5–5.2)
Alkaline Phosphatase: 86 U/L (ref 39–117)
Bilirubin, Direct: 0.1 mg/dL (ref 0.0–0.3)
Total Bilirubin: 0.3 mg/dL (ref 0.2–1.2)
Total Protein: 7.7 g/dL (ref 6.0–8.3)

## 2020-10-25 LAB — BASIC METABOLIC PANEL
BUN: 17 mg/dL (ref 6–23)
CO2: 26 mEq/L (ref 19–32)
Calcium: 9.4 mg/dL (ref 8.4–10.5)
Chloride: 103 mEq/L (ref 96–112)
Creatinine, Ser: 0.72 mg/dL (ref 0.40–1.20)
GFR: 95.67 mL/min (ref >60.00)
Glucose, Bld: 85 mg/dL (ref 70–99)
Potassium: 3.9 mEq/L (ref 3.5–5.1)
Sodium: 139 mEq/L (ref 135–145)

## 2020-10-25 LAB — HEMOGLOBIN A1C: Hgb A1c MFr Bld: 5.8 % (ref 4.6–6.5)

## 2020-10-25 LAB — VITAMIN D 25 HYDROXY (VIT D DEFICIENCY, FRACTURES): VITD: 51.64 ng/mL (ref 30.00–100.00)

## 2020-10-25 NOTE — Assessment & Plan Note (Signed)
Counseled to quit, pt not ready 

## 2020-10-25 NOTE — Assessment & Plan Note (Signed)

## 2020-10-25 NOTE — Assessment & Plan Note (Signed)
stable overall by history and exam, recent data reviewed with pt, and pt to continue medical treatment as before,  to f/u any worsening symptoms or concerns  

## 2020-10-25 NOTE — Assessment & Plan Note (Signed)
Recent fu with endo with neg recurrence,  to f/u any worsening symptoms or concerns

## 2020-10-25 NOTE — Assessment & Plan Note (Addendum)
Indication for chronic opioid: chronic lbp Medication and dose: vicodin 5 325 # pills per month: 30 Last UDS date: today Opioid Treatment Agreement signed (Y/N): yes Opioid Treatment Agreement last reviewed with patient:  today Keddie reviewed this encounter (include red flags): Yes  I spent 31 minutes in addition to time for CPX wellness examination in preparing to see the patient by review of recent labs, imaging and procedures, obtaining and reviewing separately obtained history, communicating with the patient and family or caregiver, ordering medications, tests or procedures, and documenting clinical information in the EHR including the differential Dx, treatment, and any further evaluation and other management of chronic pain management, vit d def, hyperglycemia, smoker, thyroid cancer

## 2020-10-25 NOTE — Assessment & Plan Note (Signed)
Cont oral replacement 

## 2020-10-29 LAB — DRUG SCREEN 764883 11+OXYCO+ALC+CRT-BUND
Amphetamines, Urine: NEGATIVE ng/mL
BENZODIAZ UR QL: NEGATIVE ng/mL
Barbiturate: NEGATIVE ng/mL
Cannabinoid Quant, Ur: NEGATIVE ng/mL
Cocaine (Metabolite): NEGATIVE ng/mL
Creatinine: 13.2 mg/dL — ABNORMAL LOW (ref 20.0–300.0)
Ethanol: NEGATIVE %
Meperidine: NEGATIVE ng/mL
Methadone Screen, Urine: NEGATIVE ng/mL
OPIATE SCREEN URINE: NEGATIVE ng/mL
Oxycodone/Oxymorphone, Urine: NEGATIVE ng/mL
Phencyclidine: NEGATIVE ng/mL
Propoxyphene: NEGATIVE ng/mL
Tramadol: NEGATIVE ng/mL
pH, Urine: 6.1 (ref 4.5–8.9)

## 2020-10-29 LAB — SPECIFIC GRAVITY: Specific Gravity: 1.0032

## 2020-11-04 ENCOUNTER — Encounter: Payer: Self-pay | Admitting: Internal Medicine

## 2020-11-04 MED ORDER — LOSARTAN POTASSIUM 50 MG PO TABS: 50.0000 mg | ORAL_TABLET | Freq: Every day | ORAL | 3 refills | Status: DC

## 2020-11-26 ENCOUNTER — Other Ambulatory Visit: Payer: Self-pay | Admitting: Endocrinology

## 2020-11-26 DIAGNOSIS — C73 Malignant neoplasm of thyroid gland: Secondary | ICD-10-CM

## 2020-11-26 NOTE — Telephone Encounter (Signed)
please contact patient: Please verify dosage.

## 2020-11-27 ENCOUNTER — Encounter: Payer: Self-pay | Admitting: Internal Medicine

## 2020-11-27 MED ORDER — HYDROCODONE-ACETAMINOPHEN 5-325 MG PO TABS: 1.0000 | ORAL_TABLET | Freq: Four times a day (QID) | ORAL | 0 refills | Status: DC | PRN

## 2020-11-28 NOTE — Telephone Encounter (Signed)
Patient called and said that [facility] does not take her insurance and she is requesting that HYDROcodone-acetaminophen (NORCO/VICODIN) 5-325 MG tablet Be sent to the [facility] on  [address] [place], [place] [address]

## 2020-11-28 NOTE — Telephone Encounter (Signed)
   Patient calling to report her local pharmacy is closed due to staffing issues she is requesting HYDROcodone-acetaminophen (NORCO/VICODIN) 5-325 MG tablet [facility] [place]

## 2020-11-29 MED ORDER — LEVOTHYROXINE SODIUM 125 MCG PO TABS: 125.0000 ug | ORAL_TABLET | Freq: Every day | ORAL | 1 refills | Status: DC

## 2020-11-29 NOTE — Addendum Note (Signed)
Addended by: Hinda Kehr on: 11/29/2020 09:55 AM   Modules accepted: Orders

## 2020-12-18 MED ORDER — HYDROCODONE-ACETAMINOPHEN 5-325 MG PO TABS: 1.0000 | ORAL_TABLET | Freq: Four times a day (QID) | ORAL | 0 refills | Status: DC | PRN

## 2020-12-18 NOTE — Addendum Note (Signed)
Addended by: Biagio Borg on: 12/18/2020 01:30 PM   Modules accepted: Orders

## 2020-12-23 ENCOUNTER — Encounter: Payer: Self-pay | Admitting: Internal Medicine

## 2020-12-24 ENCOUNTER — Other Ambulatory Visit: Payer: Self-pay

## 2020-12-24 MED ORDER — LOSARTAN POTASSIUM 50 MG PO TABS: 50.0000 mg | ORAL_TABLET | Freq: Every day | ORAL | 3 refills | Status: DC

## 2020-12-26 MED ORDER — HYDROCODONE-ACETAMINOPHEN 5-325 MG PO TABS: 1.0000 | ORAL_TABLET | Freq: Four times a day (QID) | ORAL | 0 refills | Status: DC | PRN

## 2020-12-26 NOTE — Addendum Note (Signed)
Addended by: Biagio Borg on: 12/26/2020 01:14 PM   Modules accepted: Orders

## 2020-12-27 ENCOUNTER — Encounter: Payer: Self-pay | Admitting: Internal Medicine

## 2020-12-27 ENCOUNTER — Other Ambulatory Visit: Payer: Self-pay

## 2020-12-27 MED ORDER — LOSARTAN POTASSIUM 50 MG PO TABS: 50.0000 mg | ORAL_TABLET | Freq: Every day | ORAL | 3 refills | Status: DC

## 2020-12-27 NOTE — Telephone Encounter (Signed)
1.Medication Requested: losartan (COZAAR) 50 MG tablet    2. Pharmacy (Name, [place]): [place], [place]  3. On Med List: yes   4. Last Visit with PCP: 12.9.21  5. Next visit date with PCP: n/a    Agent: Please be advised that RX refills may take up to 3 business days. We ask that you follow-up with your pharmacy.

## 2020-12-27 NOTE — Telephone Encounter (Signed)
1.Medication Requested: HYDROcodone-acetaminophen (NORCO/VICODIN) 5-325 MG tablet    2. Pharmacy (Name, Street, [place]): [place], [place]  3. On Med List: yes   4. Last Visit with PCP: 12.9.21  5. Next visit date with PCP: n/a   Patient said that the pharmacy did not receive the refill request.    Agent: Please be advised that RX refills may take up to 3 business days. We ask that you follow-up with your pharmacy.

## 2020-12-28 MED ORDER — HYDROCODONE-ACETAMINOPHEN 5-325 MG PO TABS: 1.0000 | ORAL_TABLET | Freq: Four times a day (QID) | ORAL | 0 refills | Status: DC | PRN

## 2021-01-10 ENCOUNTER — Ambulatory Visit (INDEPENDENT_AMBULATORY_CARE_PROVIDER_SITE_OTHER): Payer: BC Managed Care – PPO | Admitting: Endocrinology

## 2021-01-10 ENCOUNTER — Other Ambulatory Visit: Payer: Self-pay

## 2021-01-10 ENCOUNTER — Encounter: Payer: Self-pay | Admitting: Internal Medicine

## 2021-01-10 VITALS — BP 130/84 | HR 114 | Ht 64.5 in | Wt 176.2 lb

## 2021-01-10 DIAGNOSIS — E890 Postprocedural hypothyroidism: Secondary | ICD-10-CM

## 2021-01-10 DIAGNOSIS — C73 Malignant neoplasm of thyroid gland: Secondary | ICD-10-CM

## 2021-01-10 LAB — TSH: TSH: 0.95 u[IU]/mL (ref 0.35–4.50)

## 2021-01-10 LAB — T4, FREE: Free T4: 1.14 ng/dL (ref 0.60–1.60)

## 2021-01-10 NOTE — Progress Notes (Signed)
Subjective:    Patient ID: [name], female    DOB: [birth year], 55 y.o.   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   10/12: I-131 rx 158 [facility]. 11/12: post-therapy scan: pos at right neck, nodes vs remnant.   2/13: TG=2.3 (ab neg) 6/13 TG=0.7 (ab not done due to lab error).   2/14: TG undetectable (ab neg).  3/15: TG=undetectable (ab neg). 3/16: TG=0.1 (ab neg). 3/18: TG=0.2 (ab neg). 3/20: TG=0.1 (ab neg).  1/21: TG=0.2 (ab neg).  Postsurgical hypothyroidism (goal TSH is normal, due to long duration of very low TG): She says synthroid is 112 mcg/d.   Past Medical History:  Diagnosis Date  . ALLERGIC RHINITIS   . Anemia   . ANXIETY   . DEPRESSION   . Fibroid   . HYPERLIPIDEMIA    diet controlled, no meds  . Hypothyroidism   . Lichen sclerosus of female genitalia 2018  . Thyroid carcinoma [facility])     Past Surgical History:  Procedure Laterality Date  . ABDOMINAL HYSTERECTOMY N/A 01/01/2015   Procedure:  SUPRACERVICAL HYSTERECTOMY WITH BILATERAL SALPINGECTOMT  Surgeon: Jamey Reas de Berton Lan, MD;  Location: [facility];  Service: Gynecology;  Laterality: N/A;  . BILATERAL SALPINGECTOMY Bilateral 01/01/2015   Procedure: BILATERAL SALPINGECTOMY        ;  Surgeon: Everardo All Amundson de Berton Lan, MD;  Location: [facility];  Service: Gynecology;  Laterality: Bilateral;  . CESAREAN SECTION  2002   x 1  . TOTAL THYROIDECTOMY  2009/2010  . TUBAL LIGATION    . WISDOM TOOTH EXTRACTION      Social History   Socioeconomic History  . Marital status: Married    Spouse name: Not on file  . Number of children: 1  . Years of education: Not on file  . Highest education level: Not on file  Occupational History  . Occupation: customer service  Tobacco Use  . Smoking status: Current Every Day Smoker    Packs/day: 0.25    Years: 31.00    Pack years: 7.75     Types: Cigarettes  . Smokeless tobacco: Never Used  Vaping Use  . Vaping Use: Never used  Substance and Sexual Activity  . Alcohol use: Yes    Alcohol/week: 3.0 standard drinks    Types: 3 Cans of beer per week    Comment: 6 beers per week  . Drug use: No  . Sexual activity: Not Currently    Partners: Male    Birth control/protection: Surgical    Comment: tubal/TAH  Other Topics Concern  . Not on file  Social History Narrative  . Not on file   Social Determinants of Health   Financial Resource Strain: Not on file  Food Insecurity: Not on file  Transportation Needs: Not on file  Physical Activity: Not on file  Stress: Not on file  Social Connections: Not on file  Intimate Partner Violence: Not on file    Current Outpatient Medications on File Prior to Visit  Medication Sig Dispense Refill  . ALPRAZolam (XANAX) 0.5 MG tablet TAKE 1/2 TO 1 TABLET BY MOUTH DAILY AS NEEDED 30 tablet 2  . betamethasone valerate ointment (VALISONE) 0.1 % Apply to vulva twice daily for 2 weeks prn. May use twice weekly for maintenance dose. 45 g 1  . BIOTIN PO Take 1 tablet  by mouth daily.     Marland Kitchen HYDROcodone-acetaminophen (NORCO/VICODIN) 5-325 MG tablet Take 1 tablet by mouth every 6 (six) hours as needed. 30 tablet 0  . losartan (COZAAR) 50 MG tablet Take 1 tablet (50 mg total) by mouth daily. 90 tablet 3  . Omega-3 Fatty Acids (FISH OIL PO) Take 1 capsule by mouth daily.     Marland Kitchen omeprazole (PRILOSEC) 20 MG capsule Take 1 capsule (20 mg total) by mouth daily. 90 capsule 1  . PARoxetine (PAXIL) 10 MG tablet Take 1 tablet (10 mg total) by mouth every morning. 90 tablet 3  . polyethylene glycol (MIRALAX) 17 g packet Take 34 g by mouth 2 (two) times daily. You may increase as needed 14 each 0  . rosuvastatin (CRESTOR) 10 MG tablet TAKE 1 TABLET(10 MG) BY MOUTH DAILY 90 tablet 3   No current facility-administered medications on file prior to visit.    Allergies  Allergen Reactions  . Doxycycline  Nausea Only  . Sulfonamide Derivatives Itching    Family History  Problem Relation Age of Onset  . Hypertension Other   . Stroke Other   . Breast cancer Mother 59  . Breast cancer Paternal Grandmother   . Diabetes Father   . Cancer Maternal Grandmother   . Colon cancer Neg Hx     BP 130/84 (BP Location: Right Arm, Patient Position: Sitting, Cuff Size: Normal)   Pulse (!) 114   Ht 5' 4.5" (1.638 m)   Wt 176 lb 3.2 oz (79.9 kg)   LMP 12/09/2014 (Exact Date)   SpO2 97%   BMI 29.78 kg/m    Review of Systems     Objective:   Physical Exam VITAL SIGNS:  See vs page.   GENERAL: no distress.   Neck: a healed scar is present.  I do not appreciate a nodule in the thyroid or elsewhere in the neck.    Lab Results  Component Value Date   TSH 0.95 01/10/2021       Assessment & Plan:  Hypothyroidism: well-replaced.  Please continue the same synthroid.  PTC: recheck labs today.   Patient Instructions  Blood tests are requested for you today.  We'll let you know about the results.  I'll refill the medication assuming you are now on 112 mcg/d.  Please let us know if that is not the right amount.   If there results are good, please come back for a follow-up appointment in 1 year.

## 2021-01-10 NOTE — Patient Instructions (Addendum)
Blood tests are requested for you today.  We'll let you know about the results.  I'll refill the medication assuming you are now on 112 mcg/d.  Please let us know if that is not the right amount.   If there results are good, please come back for a follow-up appointment in 1 year.

## 2021-01-11 MED ORDER — LEVOTHYROXINE SODIUM 112 MCG PO TABS: 112.0000 ug | ORAL_TABLET | Freq: Every day | ORAL | 3 refills | Status: DC

## 2021-01-13 ENCOUNTER — Encounter: Payer: Self-pay | Admitting: Endocrinology

## 2021-01-13 LAB — THYROGLOBULIN ANTIBODY: Thyroglobulin Ab: <1 IU/mL (ref <=1)

## 2021-01-13 LAB — THYROGLOBULIN LEVEL: Thyroglobulin: 0.1 ng/mL — ABNORMAL LOW

## 2021-01-15 ENCOUNTER — Ambulatory Visit: Payer: BC Managed Care – PPO | Admitting: Sports Medicine

## 2021-01-20 ENCOUNTER — Encounter: Payer: Self-pay | Admitting: Podiatry

## 2021-01-20 ENCOUNTER — Other Ambulatory Visit: Payer: Self-pay

## 2021-01-20 ENCOUNTER — Ambulatory Visit (INDEPENDENT_AMBULATORY_CARE_PROVIDER_SITE_OTHER): Payer: BC Managed Care – PPO | Admitting: Podiatry

## 2021-01-20 DIAGNOSIS — L600 Ingrowing nail: Secondary | ICD-10-CM

## 2021-01-20 DIAGNOSIS — M79676 Pain in unspecified toe(s): Secondary | ICD-10-CM

## 2021-01-20 NOTE — Progress Notes (Signed)
  Subjective:  Patient ID: [name], female    DOB: 1966-06-30,  MRN: [id number]  Chief Complaint  Patient presents with  . Nail Problem    Rt hallux NL borders x 2 mo; 8/10 throbbing -worse with steel toe shoes -w/ redness and swelling no draiange Tx; epsomsalt and pedicures    55 y.o. female presents with the above complaint. History confirmed with patient.   Objective:  Physical Exam: warm, good capillary refill, no trophic changes or ulcerative lesions, normal DP and PT pulses and normal sensory exam.  Painful ingrowing nail at both borders of the right, hallux; local warmth noted and local erythema noted  Assessment:   1. Ingrown nail   2. Pain around toenail      Plan:  Patient was evaluated and treated and all questions answered.  Ingrown Nail, right -Patient elects to proceed with ingrown toenail removal today -Ingrown nail excised. See procedure note.  Procedure: Avulsion of toenail Location: Right 1st toe  Anesthesia: Lidocaine 1% plain; 1.5 mL and Marcaine 0.5% plain; 1.5 mL, digital block. Skin Prep: Betadine. Dressing: Silvadene; telfa; dry, sterile, compression dressing. Technique: Following skin prep, the toe was exsanguinated and a tourniquet was secured at the base of the toe. The nail was freed and avulsed with a hemostat. The area was cleansed. The tourniquet was then removed and sterile dressing applied. Disposition: Patient tolerated procedure well.   No follow-ups on file.

## 2021-01-20 NOTE — Patient Instructions (Signed)

## 2021-01-21 ENCOUNTER — Other Ambulatory Visit: Payer: Self-pay | Admitting: Endocrinology

## 2021-01-29 ENCOUNTER — Ambulatory Visit: Payer: BC Managed Care – PPO | Admitting: Obstetrics and Gynecology

## 2021-02-18 ENCOUNTER — Other Ambulatory Visit: Payer: Self-pay

## 2021-02-18 MED ORDER — LEVOTHYROXINE SODIUM 112 MCG PO TABS: 112.0000 ug | ORAL_TABLET | Freq: Every day | ORAL | 3 refills | Status: DC

## 2021-02-21 ENCOUNTER — Encounter: Payer: Self-pay | Admitting: Internal Medicine

## 2021-02-21 MED ORDER — HYDROCODONE-ACETAMINOPHEN 5-325 MG PO TABS: 1.0000 | ORAL_TABLET | Freq: Four times a day (QID) | ORAL | 0 refills | Status: DC | PRN

## 2021-03-27 ENCOUNTER — Encounter: Payer: Self-pay | Admitting: Internal Medicine

## 2021-03-27 MED ORDER — HYDROCODONE-ACETAMINOPHEN 5-325 MG PO TABS: 1.0000 | ORAL_TABLET | Freq: Four times a day (QID) | ORAL | 0 refills | Status: DC | PRN

## 2021-04-17 ENCOUNTER — Ambulatory Visit (INDEPENDENT_AMBULATORY_CARE_PROVIDER_SITE_OTHER): Payer: BC Managed Care – PPO | Admitting: Obstetrics and Gynecology

## 2021-04-17 ENCOUNTER — Other Ambulatory Visit: Payer: Self-pay

## 2021-04-17 ENCOUNTER — Encounter: Payer: Self-pay | Admitting: Obstetrics and Gynecology

## 2021-04-17 VITALS — BP 130/84 | HR 87 | Ht 63.5 in | Wt 175.0 lb

## 2021-04-17 DIAGNOSIS — Z01419 Encounter for gynecological examination (general) (routine) without abnormal findings: Secondary | ICD-10-CM | POA: Diagnosis not present

## 2021-04-17 MED ORDER — BETAMETHASONE VALERATE 0.1 % EX OINT: TOPICAL_OINTMENT | CUTANEOUS | 1 refills | Status: DC

## 2021-04-17 MED ORDER — PAROXETINE HCL 10 MG PO TABS: 10.0000 mg | ORAL_TABLET | ORAL | 3 refills | Status: DC

## 2021-04-17 NOTE — Progress Notes (Signed)
55 y.o. [id number] Married Caucasian female here for annual exam.    Patient complaining of vulvar irritation/itching. Using once a week.   Father's health is failing.  Patient is caring for him.  Asking about increasing her Paxil.   Son is 54 yo.  Husband is working again.   Received Covid vaccine, last in Feb. 2022.   PCP:  Cathlean Cower, MD   Patient's last menstrual period was 12/09/2014 (exact date).           Sexually active: No.  The current method of family planning is status post hysterectomy--ovaries remain.    Exercising: No.  The patient does not participate in regular exercise at present. Smoker:  Yes, 1/2ppd  Health Maintenance: Pap: 01-02-19 Neg:Neg HR HPV, 12-29-17 Neg:Neg HR HPV, 11-06-16 Neg:Neg HR HPV History of abnormal Pap:  Yes,  Pap 09/28/13 - ASCUS and positive HR HPV, Colpo12/10/14 -ECC Benign. Pap 04/11/15 - ASCUS and neg HR HPV.  Pap 10/01/14 - ASCUS, neg HR HPV. MMG:  05-22-19 Neg/Birads1--knows needs to schedule Colonoscopy: 04/14/16-3 polyps. Repeat 5 yrs--knows needs to schedule BMD:   n/a  Result  n/a TDaP:  2014 Gardasil:   no HIV:09-15-17 NR Hep C: never Screening Labs:  PCP and endocrinology.    reports that she has been smoking cigarettes. She has a 15.50 pack-year smoking history. She has never used smokeless tobacco. She reports current alcohol use of about 6.0 standard drinks of alcohol per week. She reports that she does not use drugs.  Past Medical History:  Diagnosis Date  . ALLERGIC RHINITIS   . Anemia   . ANXIETY   . DEPRESSION   . Fibroid   . HYPERLIPIDEMIA    diet controlled, no meds  . Hypothyroidism   . Lichen sclerosus of female genitalia 2018  . Thyroid carcinoma [facility])     Past Surgical History:  Procedure Laterality Date  . ABDOMINAL HYSTERECTOMY N/A 01/01/2015   Procedure:  SUPRACERVICAL HYSTERECTOMY WITH BILATERAL SALPINGECTOMT  Surgeon: Jamey Reas de Berton Lan, MD;  Location: [facility];  Service:  Gynecology;  Laterality: N/A;  . BILATERAL SALPINGECTOMY Bilateral 01/01/2015   Procedure: BILATERAL SALPINGECTOMY        ;  Surgeon: Everardo All Amundson de Berton Lan, MD;  Location: [facility];  Service: Gynecology;  Laterality: Bilateral;  . CESAREAN SECTION  2002   x 1  . TOTAL THYROIDECTOMY  2009/2010  . TUBAL LIGATION    . WISDOM TOOTH EXTRACTION      Current Outpatient Medications  Medication Sig Dispense Refill  . ALPRAZolam (XANAX) 0.5 MG tablet TAKE 1/2 TO 1 TABLET BY MOUTH DAILY AS NEEDED 30 tablet 2  . Ascorbic Acid (VITAMIN C) 1000 MG tablet Take 1,000 mg by mouth daily.    . betamethasone valerate ointment (VALISONE) 0.1 % Apply to vulva twice daily for 2 weeks prn. May use twice weekly for maintenance dose. 45 g 1  . BIOTIN PO Take 1 tablet by mouth daily.     . Cholecalciferol (VITAMIN D-3 PO) Take 1 capsule by mouth daily.    . cyanocobalamin 1000 MCG tablet Take 1,000 mcg by mouth daily.    Marland Kitchen levothyroxine (SYNTHROID) 112 MCG tablet Take 1 tablet (112 mcg total) by mouth daily. 90 tablet 3  . losartan (COZAAR) 50 MG tablet Take 1 tablet (50 mg total) by mouth daily. 90 tablet 3  . Multiple Vitamin (MULTIVITAMIN) capsule Take 1 capsule by mouth daily.    . Omega-3  Fatty Acids (FISH OIL PO) Take 1 capsule by mouth daily.     Marland Kitchen omeprazole (PRILOSEC) 20 MG capsule Take 1 capsule (20 mg total) by mouth daily. 90 capsule 1  . PARoxetine (PAXIL) 10 MG tablet Take 1 tablet (10 mg total) by mouth every morning. 90 tablet 3  . polyethylene glycol (MIRALAX) 17 g packet Take 34 g by mouth 2 (two) times daily. You may increase as needed 14 each 0  . rosuvastatin (CRESTOR) 10 MG tablet TAKE 1 TABLET(10 MG) BY MOUTH DAILY 90 tablet 3   No current facility-administered medications for this visit.    Family History  Problem Relation Age of Onset  . Hypertension Other   . Stroke Other   . Breast cancer Mother 75  . Breast cancer Paternal Grandmother   . Diabetes Father   . COPD  Father   . Heart failure Father   . Kidney disease Father   . Cancer Maternal Grandmother   . Colon cancer Neg Hx     Review of Systems  All other systems reviewed and are negative.   Exam:   BP 130/84 (Cuff Size: Large)   Pulse 87   Ht 5' 3.5" (1.613 m)   Wt 175 lb (79.4 kg)   LMP 12/09/2014 (Exact Date)   SpO2 99%   BMI 30.51 kg/m     General appearance: alert, cooperative and appears stated age Head: normocephalic, without obvious abnormality, atraumatic Neck: no adenopathy, supple, symmetrical, trachea midline and thyroid normal to inspection and palpation Lungs: clear to auscultation bilaterally Breasts: normal appearance, no masses or tenderness, No nipple retraction or dimpling, No nipple discharge or bleeding, No axillary adenopathy Heart: regular rate and rhythm Abdomen: soft, non-tender; no masses, no organomegaly Extremities: extremities normal, atraumatic, no cyanosis or edema Skin: skin color, texture, turgor normal. No rashes or lesions Lymph nodes: cervical, supraclavicular, and axillary nodes normal. Neurologic: grossly normal  Pelvic: External genitalia:  Pale skin change of bilateral labia minora and ecchymoses of right labia minora.               No abnormal inguinal nodes palpated.              Urethra:  normal appearing urethra with no masses, tenderness or lesions              Bartholins and Skenes: normal                 Vagina: normal appearing vagina with normal color and discharge, no lesions              Cervix: no lesions              Pap taken: No. Bimanual Exam:  Uterus: absent              Adnexa: no mass, fullness, tenderness              Rectal exam: Yes.  .  Confirms.              Anus:  normal sphincter tone, no lesions  Chaperone was present for exam.  Assessment:   Well woman visit with normal exam. Hx bladder cyst versus septum seen on [place] in December 2022. Had supracervical hysterectomy 01/01/15. Piece of cervix was inadvertently  left at time of hysterectomy. ASCUS and Positive HR HPV in 2014 with negative colpo.Hx recurrent ASCUS paps. Smoker. Lichen sclerosus. Hx thyroid cancer.  FHbreast cancer. Life stress.  HTN.  Elevated cholesterol.  Plan: Mammogram screening discussed.  She will schedule.  Self breast awareness reviewed. Pap and HR HPV 2025 Guidelines for Calcium, Vitamin D, regular exercise program including cardiovascular and weight bearing exercise. She will increase her existing Paxil to 20 mg daily with what she has at home.  I gave her a refill of Paxil 10 mg because she does not want to make this final decision today regarding a refill dosage.  If she does well on 20 mg, I can send another Rx in for 20 mg daily.  Refill of Valisone.  Use twice a day for 2 weeks and then at hs twice weekly.  Follow up annually and prn.

## 2021-04-17 NOTE — Patient Instructions (Signed)

## 2021-04-18 ENCOUNTER — Telehealth: Payer: Self-pay | Admitting: Endocrinology

## 2021-04-18 MED ORDER — LEVOTHYROXINE SODIUM 112 MCG PO TABS: 112.0000 ug | ORAL_TABLET | Freq: Every day | ORAL | 3 refills | Status: DC

## 2021-04-18 NOTE — Addendum Note (Signed)
Addended by: Jacqualin Combes on: 04/18/2021 02:07 PM   Modules accepted: Orders

## 2021-04-18 NOTE — Telephone Encounter (Signed)
Refill sent.

## 2021-04-18 NOTE — Telephone Encounter (Signed)
Patient called re: Patient requests new RX with refills for the following:  MEDICATION: levothyroxine (SYNTHROID) 112 MCG tablet  PHARMACY:   [place], [place] Phone:  [phone]  Fax:  [phone]      HAS THE PATIENT CONTACTED [facility]? Yes-PHARM told Patient they need new RX (Previous RX sent shows No Print-Patient requests RX be sent to [facility] listed above)   IS THIS A 90 DAY SUPPLY : Yes  IS PATIENT OUT OF MEDICATION: No  IF NOT; HOW MUCH IS LEFT: Approx. 1 week  LAST APPOINTMENT DATE: @3 /06/2021  NEXT APPOINTMENT DATE:@3 /01/2022  DO WE HAVE YOUR PERMISSION TO LEAVE A DETAILED MESSAGE?: Yes  OTHER COMMENTS:    **Let patient know to contact pharmacy at the end of the day to make sure medication is ready. **  ** Please notify patient to allow 48-72 hours to process**  **Encourage patient to contact the pharmacy for refills or they can request refills through [facility]**

## 2021-04-23 ENCOUNTER — Other Ambulatory Visit: Payer: Self-pay | Admitting: Obstetrics and Gynecology

## 2021-04-23 DIAGNOSIS — Z1231 Encounter for screening mammogram for malignant neoplasm of breast: Secondary | ICD-10-CM

## 2021-04-24 NOTE — Telephone Encounter (Signed)
Patient called stating her Rx was supposed to go to [facility] but she got a text from [facility] saying it was called in there, and they said it was 125, not 112. Patient asking if we can send the 112 to [facility].

## 2021-04-25 MED ORDER — LEVOTHYROXINE SODIUM 112 MCG PO TABS: 112.0000 ug | ORAL_TABLET | Freq: Every day | ORAL | 3 refills | Status: DC

## 2021-04-25 NOTE — Telephone Encounter (Signed)
Corrected. Resent 112 mcg to [facility]

## 2021-04-25 NOTE — Addendum Note (Signed)
Addended by: Jacqualin Combes on: 04/25/2021 02:03 PM   Modules accepted: Orders

## 2021-05-13 ENCOUNTER — Ambulatory Visit: Payer: BC Managed Care – PPO | Admitting: Internal Medicine

## 2021-05-13 ENCOUNTER — Other Ambulatory Visit: Payer: Self-pay

## 2021-05-13 ENCOUNTER — Encounter: Payer: Self-pay | Admitting: Internal Medicine

## 2021-05-13 VITALS — BP 158/100 | HR 89 | Temp 98.0°F | Ht 63.5 in | Wt 174.0 lb

## 2021-05-13 DIAGNOSIS — R739 Hyperglycemia, unspecified: Secondary | ICD-10-CM

## 2021-05-13 DIAGNOSIS — S61411D Laceration without foreign body of right hand, subsequent encounter: Secondary | ICD-10-CM | POA: Diagnosis not present

## 2021-05-13 DIAGNOSIS — T07XXXA Unspecified multiple injuries, initial encounter: Secondary | ICD-10-CM

## 2021-05-13 DIAGNOSIS — F17200 Nicotine dependence, unspecified, uncomplicated: Secondary | ICD-10-CM | POA: Diagnosis not present

## 2021-05-13 DIAGNOSIS — R030 Elevated blood-pressure reading, without diagnosis of hypertension: Secondary | ICD-10-CM | POA: Diagnosis not present

## 2021-05-13 DIAGNOSIS — E785 Hyperlipidemia, unspecified: Secondary | ICD-10-CM

## 2021-05-13 DIAGNOSIS — E559 Vitamin D deficiency, unspecified: Secondary | ICD-10-CM

## 2021-05-13 MED ORDER — HYDROCODONE-ACETAMINOPHEN 7.5-325 MG PO TABS: 1.0000 | ORAL_TABLET | Freq: Four times a day (QID) | ORAL | 0 refills | Status: DC | PRN

## 2021-05-13 NOTE — Progress Notes (Signed)
Patient ID: [name], female   DOB: 1966/07/28, 55 y.o.   MRN: [id number]        Chief Complaint: follow up right hand injury, other bruising, htn       HPI:  [name] is a 55 y.o. female here after unfortunate fall off back of a moving golf cart with laceration to right medial hand requiring 4 stitches and echymosis to right suborbital area, right upper chest near clavicle and right lateral knee, seen at ED with CT head and films neg for fracture all 8 days ago; has done well since then except for aching, sweling and pain now somewhat improved overall.  Pt denies chest pain, increased sob or doe, wheezing, orthopnea, PND, increased LE swelling, palpitations, dizziness or syncope.   Pt denies polydipsia, polyuria, or new focal neuro s/s   Pt denies fever, wt loss, night sweats, loss of appetite, or other constitutional symptoms     still smoking, not ready to quit. Taking Vit d.  BP has been ok at home, but not checked recently.   Wt Readings from Last 3 Encounters:  05/13/21 174 lb (78.9 kg)  04/17/21 175 lb (79.4 kg)  01/10/21 176 lb 3.2 oz (79.9 kg)   BP Readings from Last 3 Encounters:  05/13/21 (!) 158/100  04/17/21 130/84  01/10/21 130/84         Past Medical History:  Diagnosis Date   ALLERGIC RHINITIS    Anemia    ANXIETY    DEPRESSION    Fibroid    HYPERLIPIDEMIA    diet controlled, no meds   Hypothyroidism    Lichen sclerosus of female genitalia 2018   Thyroid carcinoma [facility])    Past Surgical History:  Procedure Laterality Date   ABDOMINAL HYSTERECTOMY N/A 01/01/2015   Procedure:  SUPRACERVICAL HYSTERECTOMY WITH BILATERAL SALPINGECTOMT  Surgeon: Everardo All Amundson de Berton Lan, MD;  Location: [facility];  Service: Gynecology;  Laterality: N/A;   BILATERAL SALPINGECTOMY Bilateral 01/01/2015   Procedure: BILATERAL SALPINGECTOMY        ;  Surgeon: Everardo All Amundson de Berton Lan, MD;  Location: [facility];  Service: Gynecology;  Laterality: Bilateral;    CESAREAN SECTION  2002   x 1   TOTAL THYROIDECTOMY  2009/2010   TUBAL LIGATION     WISDOM TOOTH EXTRACTION      reports that she has been smoking cigarettes. She has a 15.50 pack-year smoking history. She has never used smokeless tobacco. She reports current alcohol use of about 6.0 standard drinks of alcohol per week. She reports that she does not use drugs. family history includes Breast cancer in her paternal grandmother; Breast cancer (age of onset: 93) in her mother; COPD in her father; Cancer in her maternal grandmother; Diabetes in her father; Heart failure in her father; Hypertension in an other family member; Kidney disease in her father; Stroke in an other family member. Allergies  Allergen Reactions   Doxycycline Nausea Only   Sulfonamide Derivatives Itching   Current Outpatient Medications on File Prior to Visit  Medication Sig Dispense Refill   ALPRAZolam (XANAX) 0.5 MG tablet TAKE 1/2 TO 1 TABLET BY MOUTH DAILY AS NEEDED 30 tablet 2   Ascorbic Acid (VITAMIN C) 1000 MG tablet Take 1,000 mg by mouth daily.     betamethasone valerate ointment (VALISONE) 0.1 % Apply to vulva twice daily for 2 weeks prn. May use twice weekly for maintenance dose. 45 g 1   BIOTIN  PO Take 1 tablet by mouth daily.      Cholecalciferol (VITAMIN D-3 PO) Take 1 capsule by mouth daily.     cyanocobalamin 1000 MCG tablet Take 1,000 mcg by mouth daily.     levothyroxine (SYNTHROID) 112 MCG tablet Take 1 tablet (112 mcg total) by mouth daily. 90 tablet 3   losartan (COZAAR) 50 MG tablet Take 1 tablet (50 mg total) by mouth daily. 90 tablet 3   Multiple Vitamin (MULTIVITAMIN) capsule Take 1 capsule by mouth daily.     Omega-3 Fatty Acids (FISH OIL PO) Take 1 capsule by mouth daily.      omeprazole (PRILOSEC) 20 MG capsule Take 1 capsule (20 mg total) by mouth daily. 90 capsule 1   PARoxetine (PAXIL) 10 MG tablet Take 1 tablet (10 mg total) by mouth every morning. 90 tablet 3   polyethylene glycol (MIRALAX)  17 g packet Take 34 g by mouth 2 (two) times daily. You may increase as needed 14 each 0   rosuvastatin (CRESTOR) 10 MG tablet TAKE 1 TABLET(10 MG) BY MOUTH DAILY 90 tablet 3   clindamycin (CLEOCIN) 300 MG capsule Take 300 mg by mouth 2 (two) times daily.     No current facility-administered medications on file prior to visit.        ROS:  All others reviewed and negative.  Objective        PE:  BP (!) 158/100 (BP Location: Left Arm, Patient Position: Sitting, Cuff Size: Normal)   Pulse 89   Temp 98 F (36.7 C) (Oral)   Ht 5' 3.5" (1.613 m)   Wt 174 lb (78.9 kg)   LMP 12/09/2014 (Exact Date)   SpO2 97%   BMI 30.34 kg/m                 Constitutional: Pt appears in NAD               HENT: Head: NCAT.                Right Ear: External ear normal.                 Left Ear: External ear normal.                Eyes: . Pupils are equal, round, and reactive to light. Conjunctivae and EOM are normal               Nose: without d/c or deformity               Neck: Neck supple. Gross normal ROM               Cardiovascular: Normal rate and regular rhythm.                 Pulmonary/Chest: Effort normal and breath sounds without rales or wheezing.                Abd:  Soft, NT, ND, + BS, no organomegaly               Neurological: Pt is alert. At baseline orientation, motor grossly intact               Skin: Skin is warm. No rashes, no other new lesions, LE edema - none               Psychiatric: Pt behavior is normal without agitation   Micro: none  Cardiac tracings I have personally interpreted today:  none  Pertinent Radiological findings (summarize): none   Lab Results  Component Value Date   WBC 10.1 10/24/2020   HGB 15.0 10/24/2020   HCT 43.8 10/24/2020   PLT 321.0 10/24/2020   GLUCOSE 85 10/24/2020   CHOL 154 10/24/2020   TRIG 73.0 10/24/2020   HDL 61.80 10/24/2020   LDLDIRECT 131.0 03/10/2007   LDLCALC 78 10/24/2020   ALT 18 10/24/2020   AST 18 10/24/2020   NA 139  10/24/2020   K 3.9 10/24/2020   CL 103 10/24/2020   CREATININE 0.72 10/24/2020   BUN 17 10/24/2020   CO2 26 10/24/2020   TSH 0.95 01/10/2021   HGBA1C 5.8 10/24/2020   Assessment/Plan:  [name] is a 55 y.o. White or Caucasian [1] female with  has a past medical history of ALLERGIC RHINITIS, Anemia, ANXIETY, DEPRESSION, Fibroid, HYPERLIPIDEMIA, Hypothyroidism, Lichen sclerosus of female genitalia (2018), and Thyroid carcinoma ([place]).  Vitamin D deficiency Last vitamin D Lab Results  Component Value Date   VD25OH 51.64 10/24/2020   Stable, cont oral replacement   Hyperglycemia Lab Results  Component Value Date   HGBA1C 5.8 10/24/2020   Stable, pt to continue current medical treatment  - diet   Smoker counsled to quit, pt not ready  Blood pressure elevated without history of HTN Mild uncontrolled, declines med change now, for f/u bp at home and next visit  Hand laceration Resolved, stitches removed,  to f/u any worsening symptoms or concerns  Multiple bruises Mild, improving,  to f/u any worsening symptoms or concerns  Followup: Return if symptoms worsen or fail to improve.  Cathlean Cower, MD 05/17/2021 8:52 PM [facility]

## 2021-05-13 NOTE — Patient Instructions (Signed)
Your stitches were removed today  Please take all new medication as prescribed - the pain medication  You are given the work note for tomorrow  Please continue all other medications as before, and refills have been done if requested.  Please have the pharmacy call with any other refills you may need.  Please keep your appointments with your specialists as you may have planned  Please see [place] back for your yearly Physical after Oct 24 2021;  also with Lab Appointment for testing done 3-5 days before at the [place] (so this is for TWO appointments - please see the scheduling desk as you leave)  Due to the ongoing Covid 19 pandemic, our lab now requires an appointment for any labs done at our office.  If you need labs done and do not have an appointment, please call our office ahead of time to schedule before presenting to the lab for your testing.

## 2021-05-14 ENCOUNTER — Encounter: Payer: Self-pay | Admitting: Internal Medicine

## 2021-05-17 ENCOUNTER — Encounter: Payer: Self-pay | Admitting: Internal Medicine

## 2021-05-17 DIAGNOSIS — I10 Essential (primary) hypertension: Secondary | ICD-10-CM | POA: Insufficient documentation

## 2021-05-17 DIAGNOSIS — S61419A Laceration without foreign body of unspecified hand, initial encounter: Secondary | ICD-10-CM | POA: Insufficient documentation

## 2021-05-17 DIAGNOSIS — R030 Elevated blood-pressure reading, without diagnosis of hypertension: Secondary | ICD-10-CM | POA: Insufficient documentation

## 2021-05-17 DIAGNOSIS — T07XXXA Unspecified multiple injuries, initial encounter: Secondary | ICD-10-CM | POA: Insufficient documentation

## 2021-05-17 NOTE — Assessment & Plan Note (Signed)
counsled to quit, pt not ready 

## 2021-05-17 NOTE — Assessment & Plan Note (Signed)
Resolved, stitches removed,  to f/u any worsening symptoms or concerns

## 2021-05-17 NOTE — Assessment & Plan Note (Signed)
Lab Results  Component Value Date   HGBA1C 5.8 10/24/2020   Stable, pt to continue current medical treatment  - diet

## 2021-05-17 NOTE — Assessment & Plan Note (Signed)
Last vitamin D Lab Results  Component Value Date   VD25OH 51.64 10/24/2020   Stable, cont oral replacement

## 2021-05-17 NOTE — Assessment & Plan Note (Signed)
Mild uncontrolled, declines med change now, for f/u bp at home and next visit

## 2021-05-17 NOTE — Assessment & Plan Note (Signed)
Mild, improving,  to f/u any worsening symptoms or concerns

## 2021-05-22 ENCOUNTER — Encounter: Payer: Self-pay | Admitting: Internal Medicine

## 2021-05-22 MED ORDER — LOSARTAN POTASSIUM 100 MG PO TABS: 100.0000 mg | ORAL_TABLET | Freq: Every day | ORAL | 3 refills | Status: DC

## 2021-06-10 ENCOUNTER — Encounter: Payer: Self-pay | Admitting: Internal Medicine

## 2021-06-10 DIAGNOSIS — R300 Dysuria: Secondary | ICD-10-CM

## 2021-06-11 ENCOUNTER — Other Ambulatory Visit (INDEPENDENT_AMBULATORY_CARE_PROVIDER_SITE_OTHER): Payer: BC Managed Care – PPO

## 2021-06-11 ENCOUNTER — Encounter: Payer: Self-pay | Admitting: Internal Medicine

## 2021-06-11 DIAGNOSIS — R739 Hyperglycemia, unspecified: Secondary | ICD-10-CM

## 2021-06-11 DIAGNOSIS — E785 Hyperlipidemia, unspecified: Secondary | ICD-10-CM | POA: Diagnosis not present

## 2021-06-11 DIAGNOSIS — R300 Dysuria: Secondary | ICD-10-CM | POA: Diagnosis not present

## 2021-06-11 DIAGNOSIS — E559 Vitamin D deficiency, unspecified: Secondary | ICD-10-CM | POA: Diagnosis not present

## 2021-06-11 LAB — BASIC METABOLIC PANEL
BUN: 17 mg/dL (ref 6–23)
CO2: 27 mEq/L (ref 19–32)
Calcium: 9.2 mg/dL (ref 8.4–10.5)
Chloride: 101 mEq/L (ref 96–112)
Creatinine, Ser: 0.94 mg/dL (ref 0.40–1.20)
GFR: 69.17 mL/min (ref >60.00)
Glucose, Bld: 100 mg/dL — ABNORMAL HIGH (ref 70–99)
Potassium: 3.9 mEq/L (ref 3.5–5.1)
Sodium: 136 mEq/L (ref 135–145)

## 2021-06-11 LAB — CBC WITH DIFFERENTIAL/PLATELET
Basophils Absolute: 0.1 10*3/uL (ref 0.0–0.1)
Basophils Relative: 0.6 % (ref 0.0–3.0)
Eosinophils Absolute: 0.1 10*3/uL (ref 0.0–0.7)
Eosinophils Relative: 1.2 % (ref 0.0–5.0)
HCT: 40.9 % (ref 36.0–46.0)
Hemoglobin: 13.5 g/dL (ref 12.0–15.0)
Lymphocytes Relative: 42.5 % (ref 12.0–46.0)
Lymphs Abs: 3.5 10*3/uL (ref 0.7–4.0)
MCHC: 33.1 g/dL (ref 30.0–36.0)
MCV: 94.5 fl (ref 78.0–100.0)
Monocytes Absolute: 0.7 10*3/uL (ref 0.1–1.0)
Monocytes Relative: 8.8 % (ref 3.0–12.0)
Neutro Abs: 3.8 10*3/uL (ref 1.4–7.7)
Neutrophils Relative %: 46.9 % (ref 43.0–77.0)
Platelets: 328 10*3/uL (ref 150.0–400.0)
RBC: 4.33 Mil/uL (ref 3.87–5.11)
RDW: 13.1 % (ref 11.5–15.5)
WBC: 8.1 10*3/uL (ref 4.0–10.5)

## 2021-06-11 LAB — VITAMIN D 25 HYDROXY (VIT D DEFICIENCY, FRACTURES): VITD: 45.72 ng/mL (ref 30.00–100.00)

## 2021-06-11 LAB — URINALYSIS, ROUTINE W REFLEX MICROSCOPIC
Bilirubin Urine: NEGATIVE
Hgb urine dipstick: NEGATIVE
Ketones, ur: NEGATIVE
Leukocytes,Ua: NEGATIVE
Nitrite: NEGATIVE
RBC / HPF: NONE SEEN (ref 0–?)
Specific Gravity, Urine: 1.025 (ref 1.000–1.030)
Total Protein, Urine: NEGATIVE
Urine Glucose: NEGATIVE
Urobilinogen, UA: 0.2 (ref 0.0–1.0)
pH: 6 (ref 5.0–8.0)

## 2021-06-11 LAB — LIPID PANEL
Cholesterol: 154 mg/dL (ref 0–200)
HDL: 55.4 mg/dL
LDL Cholesterol: 67 mg/dL (ref 0–99)
NonHDL: 98.53
Total CHOL/HDL Ratio: 3
Triglycerides: 156 mg/dL — ABNORMAL HIGH (ref 0.0–149.0)
VLDL: 31.2 mg/dL (ref 0.0–40.0)

## 2021-06-11 LAB — TSH: TSH: 2.7 u[IU]/mL (ref 0.35–5.50)

## 2021-06-11 LAB — HEPATIC FUNCTION PANEL
ALT: 15 U/L (ref 0–35)
AST: 14 U/L (ref 0–37)
Albumin: 4.1 g/dL (ref 3.5–5.2)
Alkaline Phosphatase: 83 U/L (ref 39–117)
Bilirubin, Direct: 0.1 mg/dL (ref 0.0–0.3)
Total Bilirubin: 0.3 mg/dL (ref 0.2–1.2)
Total Protein: 7.2 g/dL (ref 6.0–8.3)

## 2021-06-11 LAB — HEMOGLOBIN A1C: Hgb A1c MFr Bld: 5.9 % (ref 4.6–6.5)

## 2021-06-13 ENCOUNTER — Other Ambulatory Visit: Payer: Self-pay | Admitting: Internal Medicine

## 2021-06-13 LAB — URINE CULTURE
MICRO NUMBER:: 12169518
SPECIMEN QUALITY:: ADEQUATE

## 2021-06-13 MED ORDER — CIPROFLOXACIN HCL 500 MG PO TABS: 500.0000 mg | ORAL_TABLET | Freq: Two times a day (BID) | ORAL | 0 refills | Status: AC

## 2021-07-08 ENCOUNTER — Encounter: Payer: Self-pay | Admitting: Gastroenterology

## 2021-07-15 ENCOUNTER — Encounter: Payer: Self-pay | Admitting: Internal Medicine

## 2021-07-15 MED ORDER — ALPRAZOLAM 0.5 MG PO TABS: ORAL_TABLET | ORAL | 2 refills | Status: DC

## 2021-07-16 DIAGNOSIS — S60031A Contusion of right middle finger without damage to nail, initial encounter: Secondary | ICD-10-CM | POA: Diagnosis not present

## 2021-07-16 DIAGNOSIS — N3090 Cystitis, unspecified without hematuria: Secondary | ICD-10-CM | POA: Diagnosis not present

## 2021-07-16 DIAGNOSIS — N3000 Acute cystitis without hematuria: Secondary | ICD-10-CM | POA: Diagnosis not present

## 2021-07-17 ENCOUNTER — Ambulatory Visit: Payer: BC Managed Care – PPO

## 2021-07-24 ENCOUNTER — Encounter: Payer: Self-pay | Admitting: Internal Medicine

## 2021-07-25 ENCOUNTER — Other Ambulatory Visit: Payer: Self-pay | Admitting: Internal Medicine

## 2021-07-25 ENCOUNTER — Encounter: Payer: Self-pay | Admitting: Internal Medicine

## 2021-07-25 DIAGNOSIS — Z86010 Personal history of colonic polyps: Secondary | ICD-10-CM

## 2021-07-25 MED ORDER — AMOXICILLIN 500 MG PO CAPS: 500.0000 mg | ORAL_CAPSULE | Freq: Three times a day (TID) | ORAL | 0 refills | Status: AC

## 2021-08-19 ENCOUNTER — Encounter: Payer: Self-pay | Admitting: Internal Medicine

## 2021-08-19 ENCOUNTER — Encounter: Payer: BC Managed Care – PPO | Admitting: Gastroenterology

## 2021-08-20 MED ORDER — HYDROCODONE-ACETAMINOPHEN 7.5-325 MG PO TABS: 1.0000 | ORAL_TABLET | Freq: Four times a day (QID) | ORAL | 0 refills | Status: DC | PRN

## 2021-09-01 ENCOUNTER — Ambulatory Visit: Payer: BC Managed Care – PPO

## 2021-09-03 ENCOUNTER — Other Ambulatory Visit: Payer: Self-pay

## 2021-09-03 ENCOUNTER — Ambulatory Visit (AMBULATORY_SURGERY_CENTER): Payer: BC Managed Care – PPO

## 2021-09-03 VITALS — Ht 63.5 in | Wt 170.0 lb

## 2021-09-03 DIAGNOSIS — Z86010 Personal history of colonic polyps: Secondary | ICD-10-CM

## 2021-09-03 MED ORDER — PLENVU 140 G PO SOLR: 1.0000 | ORAL | 0 refills | Status: DC

## 2021-09-03 NOTE — Progress Notes (Signed)

## 2021-09-04 ENCOUNTER — Encounter: Payer: Self-pay | Admitting: Gastroenterology

## 2021-09-23 ENCOUNTER — Ambulatory Visit (AMBULATORY_SURGERY_CENTER): Payer: BC Managed Care – PPO | Admitting: Gastroenterology

## 2021-09-23 ENCOUNTER — Encounter: Payer: Self-pay | Admitting: Gastroenterology

## 2021-09-23 VITALS — BP 123/79 | HR 63 | Temp 98.4°F | Resp 20 | Ht 63.3 in | Wt 170.0 lb

## 2021-09-23 DIAGNOSIS — Z1211 Encounter for screening for malignant neoplasm of colon: Secondary | ICD-10-CM | POA: Diagnosis not present

## 2021-09-23 DIAGNOSIS — Z86010 Personal history of colonic polyps: Secondary | ICD-10-CM | POA: Diagnosis not present

## 2021-09-23 DIAGNOSIS — K635 Polyp of colon: Secondary | ICD-10-CM

## 2021-09-23 MED ORDER — SODIUM CHLORIDE 0.9 % IV SOLN: 500.0000 mL | Freq: Once | INTRAVENOUS | Status: DC

## 2021-09-23 NOTE — Op Note (Signed)
[place] Patient Name: [name] Procedure Date: 09/23/2021 11:29 AM MRN: [id number] Endoscopist: Remo Lipps P. Havery Moros , MD Age: 55 Referring MD:  Date of Birth: 1965/12/17 Gender: Female Account #: [id number] Procedure:                Colonoscopy Indications:              High risk colon cancer surveillance: Personal                            history of colonic polyps (adenomas removed 03/2016) Medicines:                Monitored Anesthesia Care Procedure:                Pre-Anesthesia Assessment:                           - Prior to the procedure, a History and Physical                            was performed, and patient medications and                            allergies were reviewed. The patient's tolerance of                            previous anesthesia was also reviewed. The risks                            and benefits of the procedure and the sedation                            options and risks were discussed with the patient.                            All questions were answered, and informed consent                            was obtained. Prior Anticoagulants: The patient has                            taken no previous anticoagulant or antiplatelet                            agents. ASA Grade Assessment: II - A patient with                            mild systemic disease. After reviewing the risks                            and benefits, the patient was deemed in                            satisfactory condition to undergo the procedure.  After obtaining informed consent, the colonoscope                            was passed under direct vision. Throughout the                            procedure, the patient's blood pressure, pulse, and                            oxygen saturations were monitored continuously. The                            Olympus PCF-H190DL (#[id number]) Colonoscope was                             introduced through the anus and advanced to the the                            cecum, identified by appendiceal orifice and                            ileocecal valve. The colonoscopy was performed                            without difficulty. The patient tolerated the                            procedure well. The quality of the bowel                            preparation was good. The terminal ileum, ileocecal                            valve, appendiceal orifice, and rectum were                            photographed. Scope In: 11:33:08 AM Scope Out: 11:46:53 AM Scope Withdrawal Time: 0 hours 10 minutes 38 seconds  Total Procedure Duration: 0 hours 13 minutes 45 seconds  Findings:                 The perianal and digital rectal examinations were                            normal.                           The terminal ileum appeared normal.                           Two flat polyps were found in the hepatic flexure.                            The polyps were 3 to 4 mm in size. These polyps  were removed with a cold snare. Resection and                            retrieval were complete.                           Internal hemorrhoids were found during                            retroflexion. The hemorrhoids were small.                           The exam was otherwise without abnormality. Complications:            No immediate complications. Estimated blood loss:                            Minimal. Estimated Blood Loss:     Estimated blood loss was minimal. Impression:               - The examined portion of the ileum was normal.                           - Two 3 to 4 mm polyps at the hepatic flexure,                            removed with a cold snare. Resected and retrieved.                           - Internal hemorrhoids.                           - The examination was otherwise normal. Recommendation:           - Patient has a contact number available  for                            emergencies. The signs and symptoms of potential                            delayed complications were discussed with the                            patient. Return to normal activities tomorrow.                            Written discharge instructions were provided to the                            patient.                           - Resume previous diet.                           - Continue present medications.                           -  Await pathology results. Remo Lipps P. Bryce Cheever, MD 09/23/2021 11:50:46 AM This report has been signed electronically.

## 2021-09-23 NOTE — Progress Notes (Signed)
VS taken by C.W. 

## 2021-09-23 NOTE — Patient Instructions (Signed)
HANDOUTS GIVEN ON POLYPS AND HEMORRHOIDS.  YOU HAD AN ENDOSCOPIC PROCEDURE TODAY AT [facility]:   Refer to the procedure report that was given to you for any specific questions about what was found during the examination.  If the procedure report does not answer your questions, please call your gastroenterologist to clarify.  If you requested that your care partner not be given the details of your procedure findings, then the procedure report has been included in a sealed envelope for you to review at your convenience later.  YOU SHOULD EXPECT: Some feelings of bloating in the abdomen. Passage of more gas than usual.  Walking can help get rid of the air that was put into your GI tract during the procedure and reduce the bloating. If you had a lower endoscopy (such as a colonoscopy or flexible sigmoidoscopy) you may notice spotting of blood in your stool or on the toilet paper. If you underwent a bowel prep for your procedure, you may not have a normal bowel movement for a few days.  Please Note:  You might notice some irritation and congestion in your nose or some drainage.  This is from the oxygen used during your procedure.  There is no need for concern and it should clear up in a day or so.  SYMPTOMS TO REPORT IMMEDIATELY:  Following lower endoscopy (colonoscopy or flexible sigmoidoscopy):  Excessive amounts of blood in the stool  Significant tenderness or worsening of abdominal pains  Swelling of the abdomen that is new, acute  Fever of 100F or higher For urgent or emergent issues, a gastroenterologist can be reached at any hour by calling [phone]. Do not use MyChart messaging for urgent concerns.    DIET:  We do recommend a small meal at first, but then you may proceed to your regular diet.  Drink plenty of fluids but you should avoid alcoholic beverages for 24 hours.  ACTIVITY:  You should plan to take it easy for the rest of today and you should NOT DRIVE or use  heavy machinery until tomorrow (because of the sedation medicines used during the test).    FOLLOW UP: Our staff will call the number listed on your records 48-72 hours following your procedure to check on you and address any questions or concerns that you may have regarding the information given to you following your procedure. If we do not reach you, we will leave a message.  We will attempt to reach you two times.  During this call, we will ask if you have developed any symptoms of COVID 19. If you develop any symptoms (ie: fever, flu-like symptoms, shortness of breath, cough etc.) before then, please call [phone].  If you test positive for Covid 19 in the 2 weeks post procedure, please call and report this information to [place].    If any biopsies were taken you will be contacted by phone or by letter within the next 1-3 weeks.  Please call us at [phone] if you have not heard about the biopsies in 3 weeks.    SIGNATURES/CONFIDENTIALITY: You and/or your care partner have signed paperwork which will be entered into your electronic medical record.  These signatures attest to the fact that that the information above on your After Visit Summary has been reviewed and is understood.  Full responsibility of the confidentiality of this discharge information lies with you and/or your care-partner.

## 2021-09-23 NOTE — Progress Notes (Signed)
[facility] History and Physical   Primary Care Physician:  Biagio Borg, MD   Reason for Procedure:   History of colon polyps  Plan:    colonoscopy     HPI: [name] is a 55 y.o. female  here for colonoscopy surveillance - history of polyps, adenomas removed 03/2016. Patient denies any bowel symptoms at this time. No family history of colon cancer known, father had colon polyps. Otherwise feels well without any cardiopulmonary symptoms.    Past Medical History:  Diagnosis Date   ALLERGIC RHINITIS    Anemia    ANXIETY    DEPRESSION    Fibroid    HYPERLIPIDEMIA    diet controlled, no meds   Hypertension    Hypothyroidism    Lichen sclerosus of female genitalia 2018   Thyroid carcinoma [facility])     Past Surgical History:  Procedure Laterality Date   ABDOMINAL HYSTERECTOMY N/A 01/01/2015   Procedure:  SUPRACERVICAL HYSTERECTOMY WITH BILATERAL SALPINGECTOMT  Surgeon: Jamey Reas de Berton Lan, MD;  Location: [facility];  Service: Gynecology;  Laterality: N/A;   BILATERAL SALPINGECTOMY Bilateral 01/01/2015   Procedure: BILATERAL SALPINGECTOMY        ;  Surgeon: Everardo All Amundson de Berton Lan, MD;  Location: [facility];  Service: Gynecology;  Laterality: Bilateral;   CESAREAN SECTION  2002   x 1   TOTAL THYROIDECTOMY  2009/2010   TUBAL LIGATION     WISDOM TOOTH EXTRACTION      Prior to Admission medications   Medication Sig Start Date End Date Taking? Authorizing Provider  Ascorbic Acid (VITAMIN C) 1000 MG tablet Take 1,000 mg by mouth daily.   Yes [provider]  betamethasone valerate ointment (VALISONE) 0.1 % Apply to vulva twice daily for 2 weeks prn. May use twice weekly for maintenance dose. 04/17/21  Yes Nunzio Cobbs, MD  BIOTIN PO Take 1 tablet by mouth daily.    Yes [provider]  Cholecalciferol (VITAMIN D-3 PO) Take 1 capsule by mouth daily.   Yes [provider]  cyanocobalamin 1000 MCG tablet  Take 1,000 mcg by mouth daily.   Yes [provider]  levothyroxine (SYNTHROID) 112 MCG tablet Take 1 tablet (112 mcg total) by mouth daily. 04/25/21  Yes Renato Shin, MD  losartan (COZAAR) 100 MG tablet Take 1 tablet (100 mg total) by mouth daily. 05/22/21  Yes Biagio Borg, MD  Omega-3 Fatty Acids (FISH OIL PO) Take 1 capsule by mouth daily.    Yes [provider]  PARoxetine (PAXIL) 10 MG tablet Take 1 tablet (10 mg total) by mouth every morning. 04/17/21  Yes Nunzio Cobbs, MD  rosuvastatin (CRESTOR) 10 MG tablet TAKE 1 TABLET(10 MG) BY MOUTH DAILY 10/24/20  Yes Biagio Borg, MD  ALPRAZolam Duanne Moron) 0.5 MG tablet TAKE 1/2 TO 1 TABLET BY MOUTH DAILY AS NEEDED Patient not taking: No sig reported 07/15/21   Biagio Borg, MD  clindamycin (CLEOCIN) 300 MG capsule Take 300 mg by mouth 2 (two) times daily. Patient not taking: No sig reported 05/05/21   [provider]  HYDROcodone-acetaminophen (NORCO) 7.5-325 MG tablet Take 1 tablet by mouth every 6 (six) hours as needed for moderate pain. Patient not taking: No sig reported 08/20/21   Biagio Borg, MD  Multiple Vitamin (MULTIVITAMIN) capsule Take 1 capsule by mouth daily. Patient not taking: No sig reported    [provider]  omeprazole (PRILOSEC) 20 MG capsule Take 1 capsule (20 mg total) by mouth daily. Patient not taking: No sig reported 12/11/19   Shann Merrick, Carlota Raspberry, MD  polyethylene glycol (MIRALAX) 17 g packet Take 34 g by mouth 2 (two) times daily. You may increase as needed 10/03/19   Marciano Mundt, Carlota Raspberry, MD    Current Outpatient Medications  Medication Sig Dispense Refill   Ascorbic Acid (VITAMIN C) 1000 MG tablet Take 1,000 mg by mouth daily.     betamethasone valerate ointment (VALISONE) 0.1 % Apply to vulva twice daily for 2 weeks prn. May use twice weekly for maintenance dose. 45 g 1   BIOTIN PO Take 1 tablet by mouth daily.      Cholecalciferol (VITAMIN D-3 PO) Take 1 capsule by  mouth daily.     cyanocobalamin 1000 MCG tablet Take 1,000 mcg by mouth daily.     levothyroxine (SYNTHROID) 112 MCG tablet Take 1 tablet (112 mcg total) by mouth daily. 90 tablet 3   losartan (COZAAR) 100 MG tablet Take 1 tablet (100 mg total) by mouth daily. 90 tablet 3   Omega-3 Fatty Acids (FISH OIL PO) Take 1 capsule by mouth daily.      PARoxetine (PAXIL) 10 MG tablet Take 1 tablet (10 mg total) by mouth every morning. 90 tablet 3   rosuvastatin (CRESTOR) 10 MG tablet TAKE 1 TABLET(10 MG) BY MOUTH DAILY 90 tablet 3   ALPRAZolam (XANAX) 0.5 MG tablet TAKE 1/2 TO 1 TABLET BY MOUTH DAILY AS NEEDED (Patient not taking: No sig reported) 30 tablet 2   clindamycin (CLEOCIN) 300 MG capsule Take 300 mg by mouth 2 (two) times daily. (Patient not taking: No sig reported)     HYDROcodone-acetaminophen (NORCO) 7.5-325 MG tablet Take 1 tablet by mouth every 6 (six) hours as needed for moderate pain. (Patient not taking: No sig reported) 30 tablet 0   Multiple Vitamin (MULTIVITAMIN) capsule Take 1 capsule by mouth daily. (Patient not taking: No sig reported)     omeprazole (PRILOSEC) 20 MG capsule Take 1 capsule (20 mg total) by mouth daily. (Patient not taking: No sig reported) 90 capsule 1   polyethylene glycol (MIRALAX) 17 g packet Take 34 g by mouth 2 (two) times daily. You may increase as needed 14 each 0   Current Facility-Administered Medications  Medication Dose Route Frequency Provider Last Rate Last Admin   0.9 %  sodium chloride infusion  500 mL Intravenous Once Amaranta Mehl, Carlota Raspberry, MD        Allergies as of 09/23/2021 - Review Complete 09/23/2021  Allergen Reaction Noted   Doxycycline Nausea Only 06/10/2011   Sulfonamide derivatives Itching 07/18/2008    Family History  Problem Relation Age of Onset   Breast cancer Mother 28   Colon polyps Father    Diabetes Father    COPD Father    Heart failure Father    Kidney disease Father    Cancer Maternal Grandmother    Breast cancer  Paternal Grandmother    Hypertension Other    Stroke Other    Colon cancer Neg Hx    Esophageal cancer Neg Hx    Stomach cancer Neg Hx    Rectal cancer Neg Hx     Social History   Socioeconomic History   Marital status: Married    Spouse name: Not on file   Number of children: 1   Years of education: Not on file   Highest education level: Not on file  Occupational  History   Occupation: customer service  Tobacco Use   Smoking status: Every Day    Packs/day: 0.50    Years: 31.00    Pack years: 15.50    Types: Cigarettes   Smokeless tobacco: Never  Vaping Use   Vaping Use: Former  Substance and Sexual Activity   Alcohol use: Yes    Alcohol/week: 6.0 standard drinks    Types: 6 Cans of beer per week    Comment: 6 beers per week   Drug use: No   Sexual activity: Not Currently    Partners: Male    Birth control/protection: Surgical    Comment: tubal/TAH  Other Topics Concern   Not on file  Social History Narrative   Not on file   Social Determinants of Health   Financial Resource Strain: Not on file  Food Insecurity: Not on file  Transportation Needs: Not on file  Physical Activity: Not on file  Stress: Not on file  Social Connections: Not on file  Intimate Partner Violence: Not on file    Review of Systems: All other review of systems negative except as mentioned in the HPI.  Physical Exam: Vital signs BP 120/66   Pulse 76   Temp 98.4 F (36.9 C)   Ht 5' 3.3" (1.608 m)   Wt 170 lb (77.1 kg)   LMP 12/09/2014 (Exact Date)   SpO2 96%   BMI 29.83 kg/m   General:   Alert,  Well-developed, pleasant and cooperative in NAD Lungs:  Clear throughout to auscultation.   Heart:  Regular rate and rhythm Abdomen:  Soft, nontender and nondistended.   Neuro/Psych:  Alert and cooperative. Normal mood and affect. A and O x 3  Jolly Mango, MD [facility]

## 2021-09-23 NOTE — Progress Notes (Signed)
Called to room to assist during endoscopic procedure.  Patient ID and intended procedure confirmed with present staff. Received instructions for my participation in the procedure from the performing physician.  

## 2021-09-23 NOTE — Progress Notes (Signed)
To pacu, VSS. Report to [email or web address] 

## 2021-09-23 NOTE — Progress Notes (Signed)
Pt's states no medical or surgical changes since previsit or office visit. 

## 2021-09-25 ENCOUNTER — Telehealth: Payer: Self-pay | Admitting: *Deleted

## 2021-09-25 NOTE — Telephone Encounter (Signed)
  Follow up Call-  Call back number 09/23/2021  Post procedure Call Back phone  # [phone]  Permission to leave phone message Yes  Some recent data might be hidden     Patient questions:  Do you have a fever, pain , or abdominal swelling? No. Pain Score  0 *  Have you tolerated food without any problems? Yes.    Have you been able to return to your normal activities? Yes.    Do you have any questions about your discharge instructions: Diet   No. Medications  No. Follow up visit  No.  Do you have questions or concerns about your Care? No.  Actions: * If pain score is 4 or above: No action needed, pain <4.  Have you developed a fever since your procedure? no  2.   Have you had an respiratory symptoms (SOB or cough) since your procedure? no  3.   Have you tested positive for COVID 19 since your procedure no  4.   Have you had any family members/close contacts diagnosed with the COVID 19 since your procedure?  no   If yes to any of these questions please route to Joylene John, RN and Joella Prince, RN

## 2021-09-29 ENCOUNTER — Encounter: Payer: Self-pay | Admitting: Internal Medicine

## 2021-09-29 DIAGNOSIS — F17200 Nicotine dependence, unspecified, uncomplicated: Secondary | ICD-10-CM

## 2021-09-29 DIAGNOSIS — J449 Chronic obstructive pulmonary disease, unspecified: Secondary | ICD-10-CM

## 2021-09-30 ENCOUNTER — Ambulatory Visit
Admission: RE | Admit: 2021-09-30 | Discharge: 2021-09-30 | Disposition: A | Discharge status: Home / Self Care | Payer: BC Managed Care – PPO | Source: Ambulatory Visit | Attending: Obstetrics and Gynecology | Admitting: Obstetrics and Gynecology

## 2021-09-30 ENCOUNTER — Other Ambulatory Visit: Payer: Self-pay

## 2021-09-30 DIAGNOSIS — Z1231 Encounter for screening mammogram for malignant neoplasm of breast: Secondary | ICD-10-CM | POA: Diagnosis not present

## 2021-10-14 ENCOUNTER — Encounter: Payer: Self-pay | Admitting: Internal Medicine

## 2021-10-16 MED ORDER — HYDROCODONE-ACETAMINOPHEN 7.5-325 MG PO TABS: 1.0000 | ORAL_TABLET | Freq: Four times a day (QID) | ORAL | 0 refills | Status: DC | PRN

## 2021-11-03 ENCOUNTER — Encounter: Payer: Self-pay | Admitting: Internal Medicine

## 2021-11-10 DIAGNOSIS — Z20822 Contact with and (suspected) exposure to covid-19: Secondary | ICD-10-CM | POA: Diagnosis not present

## 2021-11-11 ENCOUNTER — Telehealth: Payer: Self-pay | Admitting: Internal Medicine

## 2021-11-11 NOTE — Telephone Encounter (Signed)
Connected to [facility] 12.26.2022.   Just tested positive for covid. sore throat, runny nose, congestion, cough, headache. no fever. No vomiting. no sob or chest pain. no confusion no dizziness. caller drinking ok and eating and urinating ok. cough is bad. symptoms for a week.   Advised home care.

## 2021-11-19 ENCOUNTER — Encounter: Payer: Self-pay | Admitting: Internal Medicine

## 2021-11-19 ENCOUNTER — Ambulatory Visit: Payer: BC Managed Care – PPO | Admitting: Internal Medicine

## 2021-11-19 ENCOUNTER — Other Ambulatory Visit: Payer: Self-pay

## 2021-11-19 VITALS — BP 122/82 | HR 76 | Resp 18 | Ht 63.0 in | Wt 178.0 lb

## 2021-11-19 DIAGNOSIS — E559 Vitamin D deficiency, unspecified: Secondary | ICD-10-CM | POA: Diagnosis not present

## 2021-11-19 DIAGNOSIS — R062 Wheezing: Secondary | ICD-10-CM

## 2021-11-19 DIAGNOSIS — F17200 Nicotine dependence, unspecified, uncomplicated: Secondary | ICD-10-CM | POA: Diagnosis not present

## 2021-11-19 DIAGNOSIS — R051 Acute cough: Secondary | ICD-10-CM

## 2021-11-19 MED ORDER — AZITHROMYCIN 250 MG PO TABS: ORAL_TABLET | ORAL | 1 refills | Status: AC

## 2021-11-19 MED ORDER — PREDNISONE 10 MG PO TABS: ORAL_TABLET | ORAL | 0 refills | Status: DC

## 2021-11-19 MED ORDER — HYDROCODONE BIT-HOMATROP MBR 5-1.5 MG/5ML PO SOLN: 5.0000 mL | Freq: Four times a day (QID) | ORAL | 0 refills | Status: DC | PRN

## 2021-11-19 MED ORDER — ALBUTEROL SULFATE HFA 108 (90 BASE) MCG/ACT IN AERS: 2.0000 | INHALATION_SPRAY | Freq: Four times a day (QID) | RESPIRATORY_TRACT | 0 refills | Status: DC | PRN

## 2021-11-19 NOTE — Progress Notes (Signed)
Patient ID: [name], female   DOB: 20-Apr-1966, 56 y.o.   MRN: [id number]        Chief Complaint: follow up cough and wheezing       HPI:  [name] is a 56 y.o. female here with recent covid infection dec 20 now resolved, but now Here with acute onset mild to mod 2-3 days ST, HA, general weakness and malaise, with prod cough greenish sputum, but Pt denies chest pain, increased sob or doe, orthopnea, PND, increased LE swelling, palpitations, dizziness or syncope, except for mild sob and wheezing last day.  Follow up covid testing neg jan 2 at home.  . Pt denies polydipsia, polyuria, or new focal neuro s/s.   Pt denies wt loss, night sweats, loss of appetite, or other constitutional symptoms.   Still smoking, not ready to quit.  Has pulm appt for later this month.  Taking Vit  d       Wt Readings from Last 3 Encounters:  11/19/21 178 lb (80.7 kg)  09/23/21 170 lb (77.1 kg)  09/03/21 170 lb (77.1 kg)   BP Readings from Last 3 Encounters:  11/19/21 122/82  09/23/21 123/79  05/13/21 (!) 158/100         Past Medical History:  Diagnosis Date   ALLERGIC RHINITIS    Anemia    ANXIETY    DEPRESSION    Fibroid    HYPERLIPIDEMIA    diet controlled, no meds   Hypertension    Hypothyroidism    Lichen sclerosus of female genitalia 2018   Thyroid carcinoma [facility])    Past Surgical History:  Procedure Laterality Date   ABDOMINAL HYSTERECTOMY N/A 01/01/2015   Procedure:  SUPRACERVICAL HYSTERECTOMY WITH BILATERAL SALPINGECTOMT  Surgeon: Everardo All Amundson de Berton Lan, MD;  Location: [facility];  Service: Gynecology;  Laterality: N/A;   BILATERAL SALPINGECTOMY Bilateral 01/01/2015   Procedure: BILATERAL SALPINGECTOMY        ;  Surgeon: Everardo All Amundson de Berton Lan, MD;  Location: [facility];  Service: Gynecology;  Laterality: Bilateral;   CESAREAN SECTION  2002   x 1   TOTAL THYROIDECTOMY  2009/2010   TUBAL LIGATION     WISDOM TOOTH EXTRACTION      reports that she has been  smoking cigarettes. She has a 15.50 pack-year smoking history. She has never used smokeless tobacco. She reports current alcohol use of about 6.0 standard drinks per week. She reports that she does not use drugs. family history includes Breast cancer in her paternal grandmother; Breast cancer (age of onset: 24) in her mother; COPD in her father; Cancer in her maternal grandmother; Colon polyps in her father; Diabetes in her father; Heart failure in her father; Hypertension in an other family member; Kidney disease in her father; Stroke in an other family member. Allergies  Allergen Reactions   Doxycycline Nausea Only   Sulfonamide Derivatives Itching   Current Outpatient Medications on File Prior to Visit  Medication Sig Dispense Refill   Ascorbic Acid (VITAMIN C) 1000 MG tablet Take 1,000 mg by mouth daily.     betamethasone valerate ointment (VALISONE) 0.1 % Apply to vulva twice daily for 2 weeks prn. May use twice weekly for maintenance dose. 45 g 1   BIOTIN PO Take 1 tablet by mouth daily.      Cholecalciferol (VITAMIN D-3 PO) Take 1 capsule by mouth daily.     clindamycin (CLEOCIN) 300 MG capsule Take 300 mg by  mouth 2 (two) times daily.     cyanocobalamin 1000 MCG tablet Take 1,000 mcg by mouth daily.     HYDROcodone-acetaminophen (NORCO) 7.5-325 MG tablet Take 1 tablet by mouth every 6 (six) hours as needed for moderate pain. 30 tablet 0   levothyroxine (SYNTHROID) 112 MCG tablet Take 1 tablet (112 mcg total) by mouth daily. 90 tablet 3   losartan (COZAAR) 100 MG tablet Take 1 tablet (100 mg total) by mouth daily. 90 tablet 3   Multiple Vitamin (MULTIVITAMIN) capsule Take 1 capsule by mouth daily.     Omega-3 Fatty Acids (FISH OIL PO) Take 1 capsule by mouth daily.      omeprazole (PRILOSEC) 20 MG capsule Take 1 capsule (20 mg total) by mouth daily. 90 capsule 1   PARoxetine (PAXIL) 10 MG tablet Take 1 tablet (10 mg total) by mouth every morning. 90 tablet 3   polyethylene glycol  (MIRALAX) 17 g packet Take 34 g by mouth 2 (two) times daily. You may increase as needed 14 each 0   rosuvastatin (CRESTOR) 10 MG tablet TAKE 1 TABLET(10 MG) BY MOUTH DAILY 90 tablet 3   ALPRAZolam (XANAX) 0.5 MG tablet TAKE 1/2 TO 1 TABLET BY MOUTH DAILY AS NEEDED (Patient not taking: Reported on 09/03/2021) 30 tablet 2   No current facility-administered medications on file prior to visit.        ROS:  All others reviewed and negative.  Objective        PE:  BP 122/82    Pulse 76    Resp 18    Ht 5\' 3"  (1.6 m)    Wt 178 lb (80.7 kg)    LMP 12/09/2014 (Exact Date)    SpO2 98%    BMI 31.53 kg/m                 Constitutional: Pt appears in NAD               HENT: Head: NCAT.                Right Ear: External ear normal.                 Left Ear: External ear normal. Bilat tm's with mild erythema.  Max sinus areas mild tender.  Pharynx with mild erythema, no exudate               Eyes: . Pupils are equal, round, and reactive to light. Conjunctivae and EOM are normal               Nose: without d/c or deformity               Neck: Neck supple. Gross normal ROM               Cardiovascular: Normal rate and regular rhythm.                 Pulmonary/Chest: Effort normal and breath sounds without rales with few bilateral wheezing.                               Neurological: Pt is alert. At baseline orientation, motor grossly intact               Skin: Skin is warm. No rashes, no other new lesions, LE edema - none               Psychiatric:  Pt behavior is normal without agitation   Micro: none  Cardiac tracings I have personally interpreted today:  none  Pertinent Radiological findings (summarize): none   Lab Results  Component Value Date   WBC 8.1 06/11/2021   HGB 13.5 06/11/2021   HCT 40.9 06/11/2021   PLT 328.0 06/11/2021   GLUCOSE 100 (H) 06/11/2021   CHOL 154 06/11/2021   TRIG 156.0 (H) 06/11/2021   HDL 55.40 06/11/2021   LDLDIRECT 131.0 03/10/2007   LDLCALC 67 06/11/2021    ALT 15 06/11/2021   AST 14 06/11/2021   NA 136 06/11/2021   K 3.9 06/11/2021   CL 101 06/11/2021   CREATININE 0.94 06/11/2021   BUN 17 06/11/2021   CO2 27 06/11/2021   TSH 2.70 06/11/2021   HGBA1C 5.9 06/11/2021   Assessment/Plan:  [name] is a 56 y.o. White or Caucasian [1] female with  has a past medical history of ALLERGIC RHINITIS, Anemia, ANXIETY, DEPRESSION, Fibroid, HYPERLIPIDEMIA, Hypertension, Hypothyroidism, Lichen sclerosus of female genitalia (2018), and Thyroid carcinoma ([place]).  Vitamin D deficiency Last vitamin D Lab Results  Component Value Date   VD25OH 45.72 06/11/2021   Stable, cont oral replacement   Cough Mild to mod, c/w bronchiti vs pna, declines cxr, for antibx course, cough med prn,  to f/u any worsening symptoms or concerns  Smoker Pt counsled to quit, pt not ready  Wheezing Mild to mod, for prednisone short course, inhaler prn,  to f/u any worsening symptoms or concerns  Followup: Return if symptoms worsen or fail to improve.  Cathlean Cower, MD 11/19/2021 9:07 PM [facility]

## 2021-11-19 NOTE — Assessment & Plan Note (Signed)
Last vitamin D Lab Results  Component Value Date   VD25OH 45.72 06/11/2021   Stable, cont oral replacement

## 2021-11-19 NOTE — Patient Instructions (Signed)
Please take all new medication as prescribed - the antibiotic, cough medicine, prednisone, and inhaler as needed  Please continue all other medications as before, and refills have been done if requested.  Please have the pharmacy call with any other refills you may need.  Please keep your appointments with your specialists as you may have planned - Pulmonary later this month

## 2021-11-19 NOTE — Assessment & Plan Note (Signed)
Pt counsled to quit, pt not ready °

## 2021-11-19 NOTE — Assessment & Plan Note (Addendum)
Mild to mod, for prednisone short course, inhaler prn,  to f/u any worsening symptoms or concerns

## 2021-11-19 NOTE — Assessment & Plan Note (Signed)
Mild to mod, c/w bronchiti vs pna, declines cxr, for antibx course, cough med prn,  to f/u any worsening symptoms or concerns

## 2021-11-21 ENCOUNTER — Encounter: Payer: Self-pay | Admitting: Internal Medicine

## 2021-11-21 ENCOUNTER — Telehealth: Payer: Self-pay

## 2021-11-21 MED ORDER — HYDROCODONE BIT-HOMATROP MBR 5-1.5 MG/5ML PO SOLN: 5.0000 mL | Freq: Four times a day (QID) | ORAL | 0 refills | Status: AC | PRN

## 2021-11-21 NOTE — Telephone Encounter (Signed)
Ok done

## 2021-11-21 NOTE — Telephone Encounter (Signed)
Pt is requesting that a new Rx for the cough medication to be called in to [facility] on [address] due to the current pharmacy being out of stock.  Please advise pt [phone]

## 2021-12-09 ENCOUNTER — Institutional Professional Consult (permissible substitution): Payer: BC Managed Care – PPO | Admitting: Pulmonary Disease

## 2021-12-11 ENCOUNTER — Other Ambulatory Visit: Payer: Self-pay | Admitting: Internal Medicine

## 2021-12-11 NOTE — Telephone Encounter (Signed)
Please refill as per office routine med refill policy (all routine meds to be refilled for 3 mo or monthly (per pt preference) up to one year from last visit, then month to month grace period for 3 mo, then further med refills will have to be denied) ? ?

## 2021-12-24 ENCOUNTER — Other Ambulatory Visit: Payer: Self-pay

## 2021-12-24 ENCOUNTER — Encounter: Payer: Self-pay | Admitting: Internal Medicine

## 2021-12-24 ENCOUNTER — Ambulatory Visit: Payer: BC Managed Care – PPO | Admitting: Internal Medicine

## 2021-12-24 VITALS — BP 122/82 | HR 60 | Resp 18 | Ht 63.0 in | Wt 180.2 lb

## 2021-12-24 DIAGNOSIS — F411 Generalized anxiety disorder: Secondary | ICD-10-CM

## 2021-12-24 DIAGNOSIS — R739 Hyperglycemia, unspecified: Secondary | ICD-10-CM

## 2021-12-24 DIAGNOSIS — E559 Vitamin D deficiency, unspecified: Secondary | ICD-10-CM | POA: Diagnosis not present

## 2021-12-24 DIAGNOSIS — F17200 Nicotine dependence, unspecified, uncomplicated: Secondary | ICD-10-CM | POA: Diagnosis not present

## 2021-12-24 NOTE — Assessment & Plan Note (Signed)
Pt counsled to quit, pt not ready °

## 2021-12-24 NOTE — Assessment & Plan Note (Addendum)
D/w pt - she has been through uncontrolled typical w/d symptoms from paxil, now mostly resolved, pt reassured, Ok to continue xanax prn for now

## 2021-12-24 NOTE — Assessment & Plan Note (Signed)
Lab Results  Component Value Date   HGBA1C 5.9 06/11/2021   Stable, pt to continue current medical treatment  - diet

## 2021-12-24 NOTE — Patient Instructions (Signed)
Please continue all other medications as before except ok to stay off the paxil  Please have the pharmacy call with any other refills you may need.  Please continue your efforts at being more active, low cholesterol diet, and weight control.  Please keep your appointments with your specialists as you may have planned  Please quit smoking

## 2021-12-24 NOTE — Assessment & Plan Note (Signed)
Last vitamin D Lab Results  Component Value Date   VD25OH 45.72 06/11/2021   Stable, cont oral replacement

## 2021-12-24 NOTE — Progress Notes (Signed)
Patient ID: [name], female   DOB: [birth year], 56 y.o.   MRN: [id number]        Chief Complaint:  Chief Complaint  Patient presents with   Dizziness & Nausous    Stopped taking paxil 2-3 weeks ago. No current symptoms at present.         HPI:  Keoshia Steinmetz Deandrade is a 56 y.o. female here with stopping the paxil cold [place] 2 wks ago with persistent dizzy, nausea, hot flashes but incidentally today much improved now.  Pt denies chest pain, increased sob or doe, wheezing, orthopnea, PND, increased LE swelling, palpitations, dizziness or syncope.   Pt denies polydipsia, polyuria, or new focal neuro s/s.   Pt denies fever, wt loss, night sweats, loss of appetite, or other constitutional symptoms  Has prn xanax available at home. No other new complaints         Wt Readings from Last 3 Encounters:  12/24/21 180 lb 3.2 oz (81.7 kg)  11/19/21 178 lb (80.7 kg)  09/23/21 170 lb (77.1 kg)   BP Readings from Last 3 Encounters:  12/24/21 122/82  11/19/21 122/82  09/23/21 123/79         Past Medical History:  Diagnosis Date   ALLERGIC RHINITIS    Anemia    ANXIETY    DEPRESSION    Fibroid    HYPERLIPIDEMIA    diet controlled, no meds   Hypertension    Hypothyroidism    Lichen sclerosus of female genitalia 2018   Thyroid carcinoma [facility])    Past Surgical History:  Procedure Laterality Date   ABDOMINAL HYSTERECTOMY N/A 01/01/2015   Procedure:  SUPRACERVICAL HYSTERECTOMY WITH BILATERAL SALPINGECTOMT  Surgeon: Everardo All Amundson de Berton Lan, MD;  Location: [facility];  Service: Gynecology;  Laterality: N/A;   BILATERAL SALPINGECTOMY Bilateral 01/01/2015   Procedure: BILATERAL SALPINGECTOMY        ;  Surgeon: Everardo All Amundson de Berton Lan, MD;  Location: [facility];  Service: Gynecology;  Laterality: Bilateral;   CESAREAN SECTION  2002   x 1   TOTAL THYROIDECTOMY  2009/2010   TUBAL LIGATION     WISDOM TOOTH EXTRACTION      reports that she has been smoking cigarettes. She has a  15.50 pack-year smoking history. She has never used smokeless tobacco. She reports current alcohol use of about 6.0 standard drinks per week. She reports that she does not use drugs. family history includes Breast cancer in her paternal grandmother; Breast cancer (age of onset: 72) in her mother; COPD in her father; Cancer in her maternal grandmother; Colon polyps in her father; Diabetes in her father; Heart failure in her father; Hypertension in an other family member; Kidney disease in her father; Stroke in an other family member. Allergies  Allergen Reactions   Doxycycline Nausea Only   Sulfonamide Derivatives Itching   Current Outpatient Medications on File Prior to Visit  Medication Sig Dispense Refill   albuterol (VENTOLIN HFA) 108 (90 Base) MCG/ACT inhaler Inhale 2 puffs into the lungs every 6 (six) hours as needed for wheezing or shortness of breath. 8 g 0   ALPRAZolam (XANAX) 0.5 MG tablet TAKE 1/2 TO 1 TABLET BY MOUTH DAILY AS NEEDED 30 tablet 2   Ascorbic Acid (VITAMIN C) 1000 MG tablet Take 1,000 mg by mouth daily.     betamethasone valerate ointment (VALISONE) 0.1 % Apply to vulva twice daily for 2 weeks prn. May use twice weekly for maintenance  dose. 45 g 1   BIOTIN PO Take 1 tablet by mouth daily.      Cholecalciferol (VITAMIN D-3 PO) Take 1 capsule by mouth daily.     clindamycin (CLEOCIN) 300 MG capsule Take 300 mg by mouth 2 (two) times daily.     cyanocobalamin 1000 MCG tablet Take 1,000 mcg by mouth daily.     HYDROcodone-acetaminophen (NORCO) 7.5-325 MG tablet Take 1 tablet by mouth every 6 (six) hours as needed for moderate pain. 30 tablet 0   levothyroxine (SYNTHROID) 112 MCG tablet Take 1 tablet (112 mcg total) by mouth daily. 90 tablet 3   losartan (COZAAR) 100 MG tablet Take 1 tablet (100 mg total) by mouth daily. 90 tablet 3   Multiple Vitamin (MULTIVITAMIN) capsule Take 1 capsule by mouth daily.     Omega-3 Fatty Acids (FISH OIL PO) Take 1 capsule by mouth daily.       omeprazole (PRILOSEC) 20 MG capsule Take 1 capsule (20 mg total) by mouth daily. 90 capsule 1   PARoxetine (PAXIL) 10 MG tablet Take 1 tablet (10 mg total) by mouth every morning. 90 tablet 3   polyethylene glycol (MIRALAX) 17 g packet Take 34 g by mouth 2 (two) times daily. You may increase as needed 14 each 0   predniSONE (DELTASONE) 10 MG tablet 3 tabs by mouth per day for 3 days,2tabs per day for 3 days,1tab per day for 3 days 18 tablet 0   rosuvastatin (CRESTOR) 10 MG tablet TAKE 1 TABLET(10 MG) BY MOUTH DAILY 90 tablet 3   No current facility-administered medications on file prior to visit.        ROS:  All others reviewed and negative.  Objective        PE:  BP 122/82    Pulse 60    Resp 18    Ht 5\' 3"  (1.6 m)    Wt 180 lb 3.2 oz (81.7 kg)    LMP 12/09/2014 (Exact Date)    SpO2 98%    BMI 31.92 kg/m                 Constitutional: Pt appears in NAD               HENT: Head: NCAT.                Right Ear: External ear normal.                 Left Ear: External ear normal.                Eyes: . Pupils are equal, round, and reactive to light. Conjunctivae and EOM are normal               Nose: without d/c or deformity               Neck: Neck supple. Gross normal ROM               Cardiovascular: Normal rate and regular rhythm.                 Pulmonary/Chest: Effort normal and breath sounds without rales or wheezing.                Abd:  Soft, NT, ND, + BS, no organomegaly               Neurological: Pt is alert. At baseline orientation, motor grossly intact  Skin: Skin is warm. No rashes, no other new lesions, LE edema - none               Psychiatric: Pt behavior is normal without agitation , mild nervous  Micro: none  Cardiac tracings I have personally interpreted today:  none  Pertinent Radiological findings (summarize): none   Lab Results  Component Value Date   WBC 8.1 06/11/2021   HGB 13.5 06/11/2021   HCT 40.9 06/11/2021   PLT 328.0 06/11/2021    GLUCOSE 100 (H) 06/11/2021   CHOL 154 06/11/2021   TRIG 156.0 (H) 06/11/2021   HDL 55.40 06/11/2021   LDLDIRECT 131.0 03/10/2007   LDLCALC 67 06/11/2021   ALT 15 06/11/2021   AST 14 06/11/2021   NA 136 06/11/2021   K 3.9 06/11/2021   CL 101 06/11/2021   CREATININE 0.94 06/11/2021   BUN 17 06/11/2021   CO2 27 06/11/2021   TSH 2.70 06/11/2021   HGBA1C 5.9 06/11/2021   Assessment/Plan:  Esperansa S Rode is a 56 y.o. White or Caucasian [1] female with  has a past medical history of ALLERGIC RHINITIS, Anemia, ANXIETY, DEPRESSION, Fibroid, HYPERLIPIDEMIA, Hypertension, Hypothyroidism, Lichen sclerosus of female genitalia (2018), and Thyroid carcinoma ([place]).  Vitamin D deficiency Last vitamin D Lab Results  Component Value Date   VD25OH 45.72 06/11/2021   Stable, cont oral replacement   Anxiety state D/w pt - she has been through uncontrolled typical w/d symptoms from paxil, now mostly resolved, pt reassured, Ok to continue xanax prn for now  Hyperglycemia Lab Results  Component Value Date   HGBA1C 5.9 06/11/2021   Stable, pt to continue current medical treatment  - diet   Smoker Pt counsled to quit, pt not ready Followup: Return in about 2 months (around 02/21/2022).  Cathlean Cower, MD 12/24/2021 6:13 PM [facility]

## 2021-12-30 ENCOUNTER — Institutional Professional Consult (permissible substitution): Payer: BC Managed Care – PPO | Admitting: Pulmonary Disease

## 2022-01-07 ENCOUNTER — Telehealth: Payer: Self-pay

## 2022-01-07 MED ORDER — AZITHROMYCIN 250 MG PO TABS: ORAL_TABLET | ORAL | 1 refills | Status: AC

## 2022-01-07 NOTE — Telephone Encounter (Signed)
Patient's visit for these symptoms were 11/19/21. Spoke with patient and she states that symptoms started back. Offered patient an appointment and patient states that she would rather not come for an appt.

## 2022-01-07 NOTE — Telephone Encounter (Signed)
Pt is calling in to report that she is started to feel better while taking the Z-pack. Since finishing the Rx the sxs are returning. Current SXS coughing, congestion, ear popping while blowing nose, fatigue. Pt is wondering if she needs another Rx.  Pt has been using inhaler and that doesn't seem to help much.  Pt CB [phone]

## 2022-01-11 DIAGNOSIS — R0981 Nasal congestion: Secondary | ICD-10-CM | POA: Diagnosis not present

## 2022-01-11 DIAGNOSIS — F17200 Nicotine dependence, unspecified, uncomplicated: Secondary | ICD-10-CM | POA: Diagnosis not present

## 2022-01-11 DIAGNOSIS — R051 Acute cough: Secondary | ICD-10-CM | POA: Diagnosis not present

## 2022-01-16 ENCOUNTER — Ambulatory Visit: Payer: BC Managed Care – PPO | Admitting: Endocrinology

## 2022-02-23 ENCOUNTER — Encounter: Payer: Self-pay | Admitting: Internal Medicine

## 2022-02-23 ENCOUNTER — Ambulatory Visit (INDEPENDENT_AMBULATORY_CARE_PROVIDER_SITE_OTHER): Payer: BC Managed Care – PPO | Admitting: Internal Medicine

## 2022-02-23 VITALS — BP 128/80 | HR 65 | Temp 98.6°F | Ht 63.0 in | Wt 178.0 lb

## 2022-02-23 DIAGNOSIS — Z0001 Encounter for general adult medical examination with abnormal findings: Secondary | ICD-10-CM

## 2022-02-23 DIAGNOSIS — E538 Deficiency of other specified B group vitamins: Secondary | ICD-10-CM

## 2022-02-23 DIAGNOSIS — E559 Vitamin D deficiency, unspecified: Secondary | ICD-10-CM | POA: Diagnosis not present

## 2022-02-23 DIAGNOSIS — F17200 Nicotine dependence, unspecified, uncomplicated: Secondary | ICD-10-CM | POA: Diagnosis not present

## 2022-02-23 DIAGNOSIS — R739 Hyperglycemia, unspecified: Secondary | ICD-10-CM

## 2022-02-23 DIAGNOSIS — I10 Essential (primary) hypertension: Secondary | ICD-10-CM

## 2022-02-23 DIAGNOSIS — E7800 Pure hypercholesterolemia, unspecified: Secondary | ICD-10-CM

## 2022-02-23 DIAGNOSIS — H9191 Unspecified hearing loss, right ear: Secondary | ICD-10-CM | POA: Insufficient documentation

## 2022-02-23 DIAGNOSIS — Z1159 Encounter for screening for other viral diseases: Secondary | ICD-10-CM | POA: Diagnosis not present

## 2022-02-23 LAB — CBC WITH DIFFERENTIAL/PLATELET
Basophils Absolute: 0.1 10*3/uL (ref 0.0–0.1)
Basophils Relative: 0.9 % (ref 0.0–3.0)
Eosinophils Absolute: 0.1 10*3/uL (ref 0.0–0.7)
Eosinophils Relative: 0.8 % (ref 0.0–5.0)
HCT: 44.2 % (ref 36.0–46.0)
Hemoglobin: 15 g/dL (ref 12.0–15.0)
Lymphocytes Relative: 33.6 % (ref 12.0–46.0)
Lymphs Abs: 2.7 10*3/uL (ref 0.7–4.0)
MCHC: 33.9 g/dL (ref 30.0–36.0)
MCV: 94.1 fl (ref 78.0–100.0)
Monocytes Absolute: 0.6 10*3/uL (ref 0.1–1.0)
Monocytes Relative: 7.8 % (ref 3.0–12.0)
Neutro Abs: 4.5 10*3/uL (ref 1.4–7.7)
Neutrophils Relative %: 56.9 % (ref 43.0–77.0)
Platelets: 348 10*3/uL (ref 150.0–400.0)
RBC: 4.69 Mil/uL (ref 3.87–5.11)
RDW: 13.3 % (ref 11.5–15.5)
WBC: 7.9 10*3/uL (ref 4.0–10.5)

## 2022-02-23 LAB — VITAMIN D 25 HYDROXY (VIT D DEFICIENCY, FRACTURES): VITD: 41.12 ng/mL (ref 30.00–100.00)

## 2022-02-23 LAB — BASIC METABOLIC PANEL
BUN: 14 mg/dL (ref 6–23)
CO2: 27 mEq/L (ref 19–32)
Calcium: 9.5 mg/dL (ref 8.4–10.5)
Chloride: 101 mEq/L (ref 96–112)
Creatinine, Ser: 0.68 mg/dL (ref 0.40–1.20)
GFR: 98.73 mL/min (ref >60.00)
Glucose, Bld: 98 mg/dL (ref 70–99)
Potassium: 4.3 mEq/L (ref 3.5–5.1)
Sodium: 139 mEq/L (ref 135–145)

## 2022-02-23 LAB — URINALYSIS, ROUTINE W REFLEX MICROSCOPIC
Bilirubin Urine: NEGATIVE
Hgb urine dipstick: NEGATIVE
Ketones, ur: NEGATIVE
Leukocytes,Ua: NEGATIVE
Nitrite: NEGATIVE
RBC / HPF: NONE SEEN (ref 0–?)
Specific Gravity, Urine: 1.01 (ref 1.000–1.030)
Total Protein, Urine: NEGATIVE
Urine Glucose: NEGATIVE
Urobilinogen, UA: 0.2 (ref 0.0–1.0)
WBC, UA: NONE SEEN (ref 0–?)
pH: 7 (ref 5.0–8.0)

## 2022-02-23 LAB — HEMOGLOBIN A1C: Hgb A1c MFr Bld: 6 % (ref 4.6–6.5)

## 2022-02-23 LAB — HEPATIC FUNCTION PANEL
ALT: 18 U/L (ref 0–35)
AST: 19 U/L (ref 0–37)
Albumin: 4.4 g/dL (ref 3.5–5.2)
Alkaline Phosphatase: 73 U/L (ref 39–117)
Bilirubin, Direct: 0.1 mg/dL (ref 0.0–0.3)
Total Bilirubin: 0.4 mg/dL (ref 0.2–1.2)
Total Protein: 7.6 g/dL (ref 6.0–8.3)

## 2022-02-23 LAB — LIPID PANEL
Cholesterol: 172 mg/dL (ref 0–200)
HDL: 72 mg/dL (ref >39.00)
LDL Cholesterol: 86 mg/dL (ref 0–99)
NonHDL: 99.78
Total CHOL/HDL Ratio: 2
Triglycerides: 69 mg/dL (ref 0.0–149.0)
VLDL: 13.8 mg/dL (ref 0.0–40.0)

## 2022-02-23 LAB — VITAMIN B12: Vitamin B-12: 579 pg/mL (ref 211–911)

## 2022-02-23 LAB — TSH: TSH: 1.46 u[IU]/mL (ref 0.35–5.50)

## 2022-02-23 MED ORDER — LEVOTHYROXINE SODIUM 112 MCG PO TABS: 112.0000 ug | ORAL_TABLET | Freq: Every day | ORAL | 3 refills | Status: DC

## 2022-02-23 MED ORDER — OMEPRAZOLE 20 MG PO CPDR: 20.0000 mg | DELAYED_RELEASE_CAPSULE | Freq: Every day | ORAL | 3 refills | Status: DC

## 2022-02-23 MED ORDER — LOSARTAN POTASSIUM 100 MG PO TABS: 100.0000 mg | ORAL_TABLET | Freq: Every day | ORAL | 3 refills | Status: DC

## 2022-02-23 MED ORDER — PAROXETINE HCL 10 MG PO TABS: 10.0000 mg | ORAL_TABLET | ORAL | 3 refills | Status: DC

## 2022-02-23 MED ORDER — ALBUTEROL SULFATE HFA 108 (90 BASE) MCG/ACT IN AERS: 2.0000 | INHALATION_SPRAY | Freq: Four times a day (QID) | RESPIRATORY_TRACT | 3 refills | Status: DC | PRN

## 2022-02-23 MED ORDER — ALPRAZOLAM 0.5 MG PO TABS: ORAL_TABLET | ORAL | 2 refills | Status: DC

## 2022-02-23 MED ORDER — ROSUVASTATIN CALCIUM 10 MG PO TABS: ORAL_TABLET | ORAL | 3 refills | Status: DC

## 2022-02-23 NOTE — Patient Instructions (Addendum)
Please consider the shingles shot if covered by the insurance ? ?Your right ear was irrigated of wax today ? ?Please ask your HR if they cover the Wegovy (ozempic) if you want to try this ? ?Please quit smoking as you are doing ? ?Please continue all other medications as before, and refills have been done if requested. ? ?Please have the pharmacy call with any other refills you may need. ? ?Please continue your efforts at being more active, low cholesterol diet, and weight control. ? ?You are otherwise up to date with prevention measures today. ? ?Please keep your appointments with your specialists as you may have planned ? ?Please go to the LAB at the blood drawing area for the tests to be done ? ?You will be contacted by phone if any changes need to be made immediately.  Otherwise, you will receive a letter about your results with an explanation, but please check with MyChart first. ? ?Please remember to sign up for MyChart if you have not done so, as this will be important to you in the future with finding out test results, communicating by private email, and scheduling acute appointments online when needed. ? ?Please make an Appointment to return for your 1 year visit, or sooner if needed, with Lab testing by Appointment as well, to be done about 3-5 days before at the [facility] (so this is for TWO appointments - please see the scheduling desk as you leave) ? ? ?Due to the ongoing Covid 19 pandemic, our lab now requires an appointment for any labs done at our office.  If you need labs done and do not have an appointment, please call our office ahead of time to schedule before presenting to the lab for your testing. ? ? ? ?

## 2022-02-23 NOTE — Progress Notes (Signed)
Patient ID: [name], female   DOB: 1966-03-10, 56 y.o.   MRN: [id number] ? ? ? ?     Chief Complaint:: wellness exam and hyperglycemia, right hearing loss, htn, hld, smoker, low vit d ? ?     HPI:  [name] is a 56 y.o. female here for wellness exam; declines shingrix, o/w up to date.   ?         ?              Also not taking Vit D.  Has 1 wk onset right hearing loss, without pain, fever, HA, vertigo or tinnitus.  Pt denies chest pain, increased sob or doe, wheezing, orthopnea, PND, increased LE swelling, palpitations, dizziness or syncope.   Pt denies polydipsia, polyuria, or new focal neuro s/s.    Pt denies fever, wt loss, night sweats, loss of appetite, or other constitutional symptoms  Has been trying to quit smoking, now doing Elfbar vaping.   ?  ?Wt Readings from Last 3 Encounters:  ?02/23/22 178 lb (80.7 kg)  ?12/24/21 180 lb 3.2 oz (81.7 kg)  ?11/19/21 178 lb (80.7 kg)  ? ?BP Readings from Last 3 Encounters:  ?02/23/22 128/80  ?12/24/21 122/82  ?11/19/21 122/82  ? ?Immunization History  ?Administered Date(s) Administered  ? Influenza,inj,Quad PF,6+ Mos 08/13/2015, 09/15/2017, 08/23/2018, 08/24/2019, 10/24/2020  ? Influenza-Unspecified 08/17/2011, 09/03/2014  ? PFIZER Comirnaty(Gray Top)Covid-19 Tri-Sucrose Vaccine 11/27/2020, 12/18/2020  ? Pneumococcal Polysaccharide-23 01/02/2015  ? Td 03/28/2010  ? Tdap 09/28/2013  ? ?There are no preventive care reminders to display for this patient. ? ?  ? ?Past Medical History:  ?Diagnosis Date  ? ALLERGIC RHINITIS   ? Anemia   ? ANXIETY   ? DEPRESSION   ? Fibroid   ? HYPERLIPIDEMIA   ? diet controlled, no meds  ? Hypertension   ? Hypothyroidism   ? Lichen sclerosus of female genitalia 2018  ? Thyroid carcinoma ([place])   ? ?Past Surgical History:  ?Procedure Laterality Date  ? ABDOMINAL HYSTERECTOMY N/A 01/01/2015  ? Procedure:  SUPRACERVICAL HYSTERECTOMY WITH BILATERAL SALPINGECTOMT  Surgeon: Jamey Reas de Berton Lan, MD;  Location: [facility];   Service: Gynecology;  Laterality: N/A;  ? BILATERAL SALPINGECTOMY Bilateral 01/01/2015  ? Procedure: BILATERAL SALPINGECTOMY        ;  Surgeon: Jamey Reas de Berton Lan, MD;  Location: [facility];  Service: Gynecology;  Laterality: Bilateral;  ? CESAREAN SECTION  2002  ? x 1  ? TOTAL THYROIDECTOMY  2009/2010  ? TUBAL LIGATION    ? WISDOM TOOTH EXTRACTION    ? ? reports that she has been smoking cigarettes. She has a 15.50 pack-year smoking history. She has never used smokeless tobacco. She reports current alcohol use of about 6.0 standard drinks per week. She reports that she does not use drugs. ?family history includes Breast cancer in her paternal grandmother; Breast cancer (age of onset: 43) in her mother; COPD in her father; Cancer in her maternal grandmother; Colon polyps in her father; Diabetes in her father; Heart failure in her father; Hypertension in an other family member; Kidney disease in her father; Stroke in an other family member. ?Allergies  ?Allergen Reactions  ? Doxycycline Nausea Only  ? Sulfonamide Derivatives Itching  ? ?Current Outpatient Medications on File Prior to Visit  ?Medication Sig Dispense Refill  ? Ascorbic Acid (VITAMIN C) 1000 MG tablet Take 1,000 mg by mouth daily.    ? betamethasone valerate  ointment (VALISONE) 0.1 % Apply to vulva twice daily for 2 weeks prn. May use twice weekly for maintenance dose. 45 g 1  ? BIOTIN PO Take 1 tablet by mouth daily.     ? Cholecalciferol (VITAMIN D-3 PO) Take 1 capsule by mouth daily.    ? cyanocobalamin 1000 MCG tablet Take 1,000 mcg by mouth daily.    ? Multiple Vitamin (MULTIVITAMIN) capsule Take 1 capsule by mouth daily.    ? Omega-3 Fatty Acids (FISH OIL PO) Take 1 capsule by mouth daily.     ? polyethylene glycol (MIRALAX) 17 g packet Take 34 g by mouth 2 (two) times daily. You may increase as needed 14 each 0  ? ?No current facility-administered medications on file prior to visit.  ? ?     ROS:  All others reviewed and  negative. ? ?Objective  ? ?     PE:  BP 128/80 (BP Location: Left Arm, Patient Position: Sitting, Cuff Size: Normal)   Pulse 65   Temp 98.6 ?F (37 ?C) (Oral)   Ht '5\' 3"'$  (1.6 m)   Wt 178 lb (80.7 kg)   LMP 12/09/2014 (Exact Date)   SpO2 98%   BMI 31.53 kg/m?  ? ?              Constitutional: Pt appears in NAD ?              HENT: Head: NCAT.  ?              Right Ear: External ear normal.   ?              Left Ear: External ear normal.  Right wax impaction cleared with irrigation ?              Eyes: . Pupils are equal, round, and reactive to light. Conjunctivae and EOM are normal ?              Nose: without d/c or deformity ?              Neck: Neck supple. Gross normal ROM ?              Cardiovascular: Normal rate and regular rhythm.   ?              Pulmonary/Chest: Effort normal and breath sounds without rales or wheezing.  ?              Abd:  Soft, NT, ND, + BS, no organomegaly ?              Neurological: Pt is alert. At baseline orientation, motor grossly intact ?              Skin: Skin is warm. No rashes, no other new lesions, LE edema - none ?              Psychiatric: Pt behavior is normal without agitation  ? ?Micro: none ? ?Cardiac tracings I have personally interpreted today:  none ? ?Pertinent Radiological findings (summarize): none  ? ?Lab Results  ?Component Value Date  ? WBC 7.9 02/23/2022  ? HGB 15.0 02/23/2022  ? HCT 44.2 02/23/2022  ? PLT 348.0 02/23/2022  ? GLUCOSE 98 02/23/2022  ? CHOL 172 02/23/2022  ? TRIG 69.0 02/23/2022  ? HDL 72.00 02/23/2022  ? LDLDIRECT 131.0 03/10/2007  ? [place] 86 02/23/2022  ? ALT 18 02/23/2022  ? AST 19 02/23/2022  ? NA 139 02/23/2022  ?  K 4.3 02/23/2022  ? CL 101 02/23/2022  ? CREATININE 0.68 02/23/2022  ? BUN 14 02/23/2022  ? CO2 27 02/23/2022  ? TSH 1.46 02/23/2022  ? HGBA1C 6.0 02/23/2022  ? ?Assessment/Plan:  ?[name] is a 56 y.o. White or Caucasian [1] female with  has a past medical history of ALLERGIC RHINITIS, Anemia, ANXIETY,  DEPRESSION, Fibroid, HYPERLIPIDEMIA, Hypertension, Hypothyroidism, Lichen sclerosus of female genitalia (2018), and Thyroid carcinoma ([place]). ? ?Encounter for well adult exam with abnormal findings ?Age and sex appropriate education and counseling updated with regular exercise and diet ?Referrals for preventative services - none needed ?Immunizations addressed - declines shingrix ?Smoking counseling  - none needed ?Evidence for depression or other mood disorder - none significant ?Most recent labs reviewed. ?I have personally reviewed and have noted: ?1) the patient's medical and social history ?2) The patient's current medications and supplements ?3) The patient's height, weight, and BMI have been recorded in the chart ? ? ?Acute hearing loss, right ?Improved hearing with irrigation ? ?Ceruminosis is noted.  Wax is removed by syringing and manual debridement. Instructions for home care to prevent wax buildup are given. ? ? ?HTN (hypertension) ?BP Readings from Last 3 Encounters:  ?02/23/22 128/80  ?12/24/21 122/82  ?11/19/21 122/82  ? ?Stable, pt to continue medical treatment losartan 100 ? ? ?Hyperglycemia ?Lab Results  ?Component Value Date  ? HGBA1C 6.0 02/23/2022  ? ?Stable, pt to continue current medical treatment  - diet ? ? ?Hyperlipidemia ?Lab Results  ?Component Value Date  ? [place] 86 02/23/2022  ? ?Stable, pt to continue current statin crestor ? ? ?Smoker ?Pt counsled to quit, pt trying with elfbar ? ?Vitamin D deficiency ?Last vitamin D ?Lab Results  ?Component Value Date  ? VD25OH 41.12 02/23/2022  ? ?Stable, cont oral replacement ? ?Followup: Return in about 1 year (around 02/24/2023). ? ?Jessica Cower, MD 03/01/2022 5:53 PM ?[facility] ?

## 2022-02-24 LAB — HEPATITIS C ANTIBODY
Hepatitis C Ab: NONREACTIVE
SIGNAL TO CUT-OFF: 0.28 (ref <1.00)

## 2022-02-24 NOTE — Progress Notes (Signed)
Patient consent obtained. ?Irrigation with water and peroxide performed on right ear. Full view of tympanic membranes after procedure.  ?Patient tolerated procedure well.  ? ?

## 2022-03-01 ENCOUNTER — Encounter: Payer: Self-pay | Admitting: Internal Medicine

## 2022-03-01 NOTE — Assessment & Plan Note (Signed)
Lab Results  ?Component Value Date  ? HGBA1C 6.0 02/23/2022  ? ?Stable, pt to continue current medical treatment  - diet ? ?

## 2022-03-01 NOTE — Assessment & Plan Note (Signed)
Lab Results  ?Component Value Date  ? [place] 86 02/23/2022  ? ?Stable, pt to continue current statin crestor ? ?

## 2022-03-01 NOTE — Assessment & Plan Note (Signed)
Last vitamin D ?Lab Results  ?Component Value Date  ? VD25OH 41.12 02/23/2022  ? ?Stable, cont oral replacement ? ?

## 2022-03-01 NOTE — Assessment & Plan Note (Signed)
Pt counsled to quit, pt trying with elfbar ?

## 2022-03-01 NOTE — Assessment & Plan Note (Signed)

## 2022-03-01 NOTE — Assessment & Plan Note (Signed)
Improved hearing with irrigation ? ?Ceruminosis is noted.  Wax is removed by syringing and manual debridement. Instructions for home care to prevent wax buildup are given. ? ?

## 2022-03-01 NOTE — Assessment & Plan Note (Signed)
BP Readings from Last 3 Encounters:  ?02/23/22 128/80  ?12/24/21 122/82  ?11/19/21 122/82  ? ?Stable, pt to continue medical treatment losartan 100 ? ?

## 2022-03-09 ENCOUNTER — Ambulatory Visit: Payer: BC Managed Care – PPO | Admitting: Endocrinology

## 2022-03-09 VITALS — BP 128/84 | HR 90 | Ht 63.0 in | Wt 178.0 lb

## 2022-03-09 DIAGNOSIS — C73 Malignant neoplasm of thyroid gland: Secondary | ICD-10-CM | POA: Diagnosis not present

## 2022-03-09 NOTE — Progress Notes (Signed)
? ?Subjective:  ? ? Patient ID: [name], female    DOB: [birth year], 56 y.o.   MRN: [id number] ? ?HPI ?Pt returns for f/u of stage-1 PTC:   ?10/12: thyroidectomy: 1.7 cm left lobe PTC, with 2 pos nodes (T1b N1 M0).   ?10/12: RAI rx 158 mci. ?11/12: post-therapy scan: pos at right neck, nodes vs remnant.   ?2/13: TG=2.3 (ab neg) ?6/13 TG=0.7 (ab not done due to lab error).   ?2/14: TG undetectable (ab neg).  ?3/15: TG=undetectable (ab neg).   ?3/16: TG=0.1 (ab neg). ?3/18: TG=0.2 (ab neg). ?3/20: TG=0.1 (ab neg).  ?1/21: TG=0.2 (ab neg).  ?2/22 TG=0.1 (Ab neg) ?Denies pain or swelling at the ant neck.   ?Postsurgical hypothyroidism (goal TSH is normal, due to long duration of very low TG).   ?Past Medical History:  ?Diagnosis Date  ? ALLERGIC RHINITIS   ? Anemia   ? ANXIETY   ? DEPRESSION   ? Fibroid   ? HYPERLIPIDEMIA   ? diet controlled, no meds  ? Hypertension   ? Hypothyroidism   ? Lichen sclerosus of female genitalia 2018  ? Thyroid carcinoma ([place])   ? ? ?Past Surgical History:  ?Procedure Laterality Date  ? ABDOMINAL HYSTERECTOMY N/A 01/01/2015  ? Procedure:  SUPRACERVICAL HYSTERECTOMY WITH BILATERAL SALPINGECTOMT  Surgeon: Jamey Reas de Berton Lan, MD;  Location: [facility];  Service: Gynecology;  Laterality: N/A;  ? BILATERAL SALPINGECTOMY Bilateral 01/01/2015  ? Procedure: BILATERAL SALPINGECTOMY        ;  Surgeon: Jamey Reas de Berton Lan, MD;  Location: [facility];  Service: Gynecology;  Laterality: Bilateral;  ? CESAREAN SECTION  2002  ? x 1  ? TOTAL THYROIDECTOMY  2009/2010  ? TUBAL LIGATION    ? WISDOM TOOTH EXTRACTION    ? ? ?Social History  ? ?Socioeconomic History  ? Marital status: Married  ?  Spouse name: Not on file  ? Number of children: 1  ? Years of education: Not on file  ? Highest education level: Not on file  ?Occupational History  ? Occupation: customer service  ?Tobacco Use  ? Smoking status: Every Day  ?  Packs/day: 0.50  ?  Years: 31.00  ?  Pack years: 15.50  ?   Types: Cigarettes  ? Smokeless tobacco: Never  ?Vaping Use  ? Vaping Use: Former  ?Substance and Sexual Activity  ? Alcohol use: Yes  ?  Alcohol/week: 6.0 standard drinks  ?  Types: 6 Cans of beer per week  ?  Comment: 6 beers per week  ? Drug use: No  ? Sexual activity: Not Currently  ?  Partners: Male  ?  Birth control/protection: Surgical  ?  Comment: tubal/TAH  ?Other Topics Concern  ? Not on file  ?Social History Narrative  ? Not on file  ? ?Social Determinants of Health  ? ?Financial Resource Strain: Not on file  ?Food Insecurity: Not on file  ?Transportation Needs: Not on file  ?Physical Activity: Not on file  ?Stress: Not on file  ?Social Connections: Not on file  ?Intimate Partner Violence: Not on file  ? ? ?Current Outpatient Medications on File Prior to Visit  ?Medication Sig Dispense Refill  ? albuterol (VENTOLIN HFA) 108 (90 Base) MCG/ACT inhaler Inhale 2 puffs into the lungs every 6 (six) hours as needed for wheezing or shortness of breath. 24 g 3  ? ALPRAZolam (XANAX) 0.5 MG tablet TAKE 1/2 TO 1 TABLET BY MOUTH  DAILY AS NEEDED 30 tablet 2  ? Ascorbic Acid (VITAMIN C) 1000 MG tablet Take 1,000 mg by mouth daily.    ? betamethasone valerate ointment (VALISONE) 0.1 % Apply to vulva twice daily for 2 weeks prn. May use twice weekly for maintenance dose. 45 g 1  ? BIOTIN PO Take 1 tablet by mouth daily.     ? Cholecalciferol (VITAMIN D-3 PO) Take 1 capsule by mouth daily.    ? cyanocobalamin 1000 MCG tablet Take 1,000 mcg by mouth daily.    ? levothyroxine (SYNTHROID) 112 MCG tablet Take 1 tablet (112 mcg total) by mouth daily. 90 tablet 3  ? losartan (COZAAR) 100 MG tablet Take 1 tablet (100 mg total) by mouth daily. 90 tablet 3  ? Multiple Vitamin (MULTIVITAMIN) capsule Take 1 capsule by mouth daily.    ? Omega-3 Fatty Acids (FISH OIL PO) Take 1 capsule by mouth daily.     ? omeprazole (PRILOSEC) 20 MG capsule Take 1 capsule (20 mg total) by mouth daily. 90 capsule 3  ? PARoxetine (PAXIL) 10 MG tablet  Take 1 tablet (10 mg total) by mouth every morning. 90 tablet 3  ? polyethylene glycol (MIRALAX) 17 g packet Take 34 g by mouth 2 (two) times daily. You may increase as needed 14 each 0  ? rosuvastatin (CRESTOR) 10 MG tablet TAKE 1 TABLET(10 MG) BY MOUTH DAILY 90 tablet 3  ? ?No current facility-administered medications on file prior to visit.  ? ? ?Allergies  ?Allergen Reactions  ? Doxycycline Nausea Only  ? Sulfonamide Derivatives Itching  ? ? ?Family History  ?Problem Relation Age of Onset  ? Breast cancer Mother 11  ? Colon polyps Father   ? Diabetes Father   ? COPD Father   ? Heart failure Father   ? Kidney disease Father   ? Cancer Maternal Grandmother   ? Breast cancer Paternal Grandmother   ? Hypertension Other   ? Stroke Other   ? Colon cancer Neg Hx   ? Esophageal cancer Neg Hx   ? Stomach cancer Neg Hx   ? Rectal cancer Neg Hx   ? ? ?BP 128/84 (BP Location: Left Arm, Patient Position: Sitting, Cuff Size: Normal)   Pulse 90   Ht _0  (1.6 m)   Wt 178 lb (80.7 kg)   LMP 12/09/2014 (Exact Date)   SpO2 98%   BMI 31.53 kg/m?  ? ? ?Review of Systems ? ?   ?Objective:  ? Physical Exam ?VITAL SIGNS:  See vs page. ?GENERAL: no distress.   ?Neck: a healed scar is present.  I do not appreciate a nodule in the thyroid or elsewhere in the neck.   ? ? ?Lab Results  ?Component Value Date  ? TSH 1.46 02/23/2022  ? ? ?   ?Assessment & Plan:  ?Hypothyroidism: well-controlled ?PTC: due for recheck. ? ?Patient Instructions  ?blood tests are requested for you today.  We'll let you know about the results.   ?Please continue the same levothyroxine.    ?You should have an endocrinology follow-up appointment in 1 year.   ? ? ? ?

## 2022-03-09 NOTE — Patient Instructions (Addendum)
blood tests are requested for you today.  We'll let you know about the results.   ?Please continue the same levothyroxine.    ?You should have an endocrinology follow-up appointment in 1 year.   ? ?

## 2022-03-11 ENCOUNTER — Encounter: Payer: Self-pay | Admitting: Endocrinology

## 2022-03-11 LAB — THYROGLOBULIN LEVEL: Thyroglobulin: 0.3 ng/mL — ABNORMAL LOW

## 2022-03-11 LAB — THYROGLOBULIN ANTIBODY: Thyroglobulin Ab: <1 IU/mL (ref <=1)

## 2022-04-08 ENCOUNTER — Encounter: Payer: Self-pay | Admitting: Internal Medicine

## 2022-04-08 NOTE — Telephone Encounter (Signed)
Sorry, I dont prescribed scheduled II or higher even as needed on request, due to the 3 pages of regulations I am supposed to follow, and one time I was reported to the Medical Board about a patient selling his medication I had no knowledge of, as if this was my fault.  sorry

## 2022-05-26 NOTE — Progress Notes (Deleted)
56 y.o. G81P1001 Married Caucasian female here for annual exam.    PCP:     Patient's last menstrual period was 12/05/2014 (approximate).           Sexually active: {yes no:314532}  The current method of family planning is status post hysterectomy--ovaries remain.    Exercising: {yes no:314532}  {types:19826} Smoker:  {YES NO:22349}  Health Maintenance: Pap:   01-02-19 Neg:Neg HR HPV, 12-29-17 Neg:Neg HR HPV, 11-06-16 Neg:Neg HR HPV History of abnormal Pap:  Yes,   Pap 09/28/13 - ASCUS and positive HR HPV, Colpo 10/25/13 - ECC Benign.  Pap 04/11/15 - ASCUS and neg HR HPV.  Pap 10/01/14 - ASCUS, neg HR HPV. MMG:  09-30-21 Neg/Birads1 Colonoscopy: ***09-23-21 polyps removed;next  BMD:   n/a  Result  n/a TDaP:  09-28-13 Gardasil:   no HIV: 09-15-17 NR Hep C: 02-23-22 Neg Screening Labs:  Hb today: ***, Urine today: ***   reports that she has been smoking cigarettes. She has a 15.50 pack-year smoking history. She has never used smokeless tobacco. She reports current alcohol use of about 6.0 standard drinks of alcohol per week. She reports that she does not use drugs.  Past Medical History:  Diagnosis Date   ALLERGIC RHINITIS    Anemia    ANXIETY    DEPRESSION    Fibroid    HYPERLIPIDEMIA    diet controlled, no meds   Hypertension    Hypothyroidism    Lichen sclerosus of female genitalia 2018   Thyroid carcinoma [facility])     Past Surgical History:  Procedure Laterality Date   ABDOMINAL HYSTERECTOMY N/A 01/01/2015   Procedure:  SUPRACERVICAL HYSTERECTOMY WITH BILATERAL SALPINGECTOMT  Surgeon: Jamey Reas de Berton Lan, MD;  Location: [facility];  Service: Gynecology;  Laterality: N/A;   BILATERAL SALPINGECTOMY Bilateral 01/01/2015   Procedure: BILATERAL SALPINGECTOMY        ;  Surgeon: Everardo All Amundson de Berton Lan, MD;  Location: [facility];  Service: Gynecology;  Laterality: Bilateral;   CESAREAN SECTION  2002   x 1   TOTAL THYROIDECTOMY  2009/2010   TUBAL LIGATION      WISDOM TOOTH EXTRACTION      Current Outpatient Medications  Medication Sig Dispense Refill   albuterol (VENTOLIN HFA) 108 (90 Base) MCG/ACT inhaler Inhale 2 puffs into the lungs every 6 (six) hours as needed for wheezing or shortness of breath. 24 g 3   ALPRAZolam (XANAX) 0.5 MG tablet TAKE 1/2 TO 1 TABLET BY MOUTH DAILY AS NEEDED 30 tablet 2   Ascorbic Acid (VITAMIN C) 1000 MG tablet Take 1,000 mg by mouth daily.     betamethasone valerate ointment (VALISONE) 0.1 % Apply to vulva twice daily for 2 weeks prn. May use twice weekly for maintenance dose. 45 g 1   BIOTIN PO Take 1 tablet by mouth daily.      Cholecalciferol (VITAMIN D-3 PO) Take 1 capsule by mouth daily.     cyanocobalamin 1000 MCG tablet Take 1,000 mcg by mouth daily.     levothyroxine (SYNTHROID) 112 MCG tablet Take 1 tablet (112 mcg total) by mouth daily. 90 tablet 3   losartan (COZAAR) 100 MG tablet Take 1 tablet (100 mg total) by mouth daily. 90 tablet 3   Multiple Vitamin (MULTIVITAMIN) capsule Take 1 capsule by mouth daily.     Omega-3 Fatty Acids (FISH OIL PO) Take 1 capsule by mouth daily.      omeprazole (PRILOSEC) 20 MG capsule  Take 1 capsule (20 mg total) by mouth daily. 90 capsule 3   PARoxetine (PAXIL) 10 MG tablet Take 1 tablet (10 mg total) by mouth every morning. 90 tablet 3   polyethylene glycol (MIRALAX) 17 g packet Take 34 g by mouth 2 (two) times daily. You may increase as needed 14 each 0   rosuvastatin (CRESTOR) 10 MG tablet TAKE 1 TABLET(10 MG) BY MOUTH DAILY 90 tablet 3   No current facility-administered medications for this visit.    Family History  Problem Relation Age of Onset   Breast cancer Mother 7   Colon polyps Father    Diabetes Father    COPD Father    Heart failure Father    Kidney disease Father    Cancer Maternal Grandmother    Breast cancer Paternal Grandmother    Hypertension Other    Stroke Other    Colon cancer Neg Hx    Esophageal cancer Neg Hx    Stomach cancer Neg Hx     Rectal cancer Neg Hx     Review of Systems  Exam:   LMP 12/05/2014 (Approximate)     General appearance: alert, cooperative and appears stated age Head: normocephalic, without obvious abnormality, atraumatic Neck: no adenopathy, supple, symmetrical, trachea midline and thyroid normal to inspection and palpation Lungs: clear to auscultation bilaterally Breasts: normal appearance, no masses or tenderness, No nipple retraction or dimpling, No nipple discharge or bleeding, No axillary adenopathy Heart: regular rate and rhythm Abdomen: soft, non-tender; no masses, no organomegaly Extremities: extremities normal, atraumatic, no cyanosis or edema Skin: skin color, texture, turgor normal. No rashes or lesions Lymph nodes: cervical, supraclavicular, and axillary nodes normal. Neurologic: grossly normal  Pelvic: External genitalia:  no lesions              No abnormal inguinal nodes palpated.              Urethra:  normal appearing urethra with no masses, tenderness or lesions              Bartholins and Skenes: normal                 Vagina: normal appearing vagina with normal color and discharge, no lesions              Cervix: no lesions              Pap taken: {yes no:314532} Bimanual Exam:  Uterus:  normal size, contour, position, consistency, mobility, non-tender              Adnexa: no mass, fullness, tenderness              Rectal exam: {yes no:314532}.  Confirms.              Anus:  normal sphincter tone, no lesions  Chaperone was present for exam:  ***  Assessment:   Well woman visit with gynecologic exam.   Plan: Mammogram screening discussed. Self breast awareness reviewed. Pap and HR HPV as above. Guidelines for Calcium, Vitamin D, regular exercise program including cardiovascular and weight bearing exercise.   Follow up annually and prn.   Additional counseling given.  {yes [id number]. _______ minutes face to face time of which over 50% was spent in counseling.     After visit summary provided.

## 2022-05-27 ENCOUNTER — Ambulatory Visit: Payer: BC Managed Care – PPO | Admitting: Obstetrics and Gynecology

## 2022-06-17 DIAGNOSIS — M5430 Sciatica, unspecified side: Secondary | ICD-10-CM | POA: Diagnosis not present

## 2022-06-17 DIAGNOSIS — M545 Low back pain, unspecified: Secondary | ICD-10-CM | POA: Diagnosis not present

## 2022-06-23 ENCOUNTER — Encounter: Payer: Self-pay | Admitting: Internal Medicine

## 2022-06-23 ENCOUNTER — Ambulatory Visit: Payer: BC Managed Care – PPO | Admitting: Internal Medicine

## 2022-06-23 VITALS — BP 130/70 | HR 89 | Temp 98.3°F | Ht 63.0 in | Wt 176.0 lb

## 2022-06-23 DIAGNOSIS — T783XXA Angioneurotic edema, initial encounter: Secondary | ICD-10-CM | POA: Diagnosis not present

## 2022-06-23 DIAGNOSIS — M5432 Sciatica, left side: Secondary | ICD-10-CM | POA: Diagnosis not present

## 2022-06-23 DIAGNOSIS — J309 Allergic rhinitis, unspecified: Secondary | ICD-10-CM | POA: Diagnosis not present

## 2022-06-23 MED ORDER — FLUTICASONE PROPIONATE 50 MCG/ACT NA SUSP: 2.0000 | Freq: Every day | NASAL | 6 refills | Status: DC

## 2022-06-23 MED ORDER — CETIRIZINE HCL 10 MG PO TABS: 10.0000 mg | ORAL_TABLET | Freq: Every day | ORAL | 11 refills | Status: DC

## 2022-06-23 MED ORDER — PREDNISONE 10 MG PO TABS: ORAL_TABLET | ORAL | 0 refills | Status: DC

## 2022-06-23 NOTE — Assessment & Plan Note (Addendum)
Mild recurrent after recent tx - for repeat prednisone taper, declines allergy referral for now

## 2022-06-23 NOTE — Assessment & Plan Note (Signed)
Also for add zyrtec, flonase and prednisone taper asd,  to f/u any worsening symptoms or concerns

## 2022-06-23 NOTE — Patient Instructions (Signed)
Please take all new medication as prescribed - the zyrtec, nasacort and prednisone  Please call if you change your mind about the referral to Allergy  Please continue all other medications as before, and refills have been done if requested.  Please have the pharmacy call with any other refills you may need.  Please keep your appointments with your specialists as you may have planned

## 2022-06-23 NOTE — Progress Notes (Signed)
Patient ID: [name], female   DOB: November 28, 1965, 56 y.o.   MRN: [id number]        Chief Complaint: follow up recent hives improved but not facial swelling without pain, and recent sciatica        HPI:  [name] is a 56 y.o. female here with c/o recent left lower back pain and sciatica with pain to the left post leg and knee , incidentally also with hive like rash to the extremity and torso tx with prednisone taper finished yesterday after seen [facility].  Unfortunately today wakes up with painless facial swelling worse on the right side with lying on the right side to sleep at night.  Has not tried any OTC meds such as antihistamine or steroid except for benadryl taken for the hive rash now resolved.  Also left sciatica completely resolved.  Pt denies chest pain, increased sob or doe, wheezing, orthopnea, PND, increased LE swelling, palpitations, dizziness or syncope.   Pt denies polydipsia, polyuria, or new focal neuro s/s.        Wt Readings from Last 3 Encounters:  06/23/22 176 lb (79.8 kg)  03/09/22 178 lb (80.7 kg)  02/23/22 178 lb (80.7 kg)   BP Readings from Last 3 Encounters:  06/23/22 130/70  03/09/22 128/84  02/23/22 128/80         Past Medical History:  Diagnosis Date   ALLERGIC RHINITIS    Anemia    ANXIETY    DEPRESSION    Fibroid    HYPERLIPIDEMIA    diet controlled, no meds   Hypertension    Hypothyroidism    Lichen sclerosus of female genitalia 2018   Thyroid carcinoma [facility])    Past Surgical History:  Procedure Laterality Date   ABDOMINAL HYSTERECTOMY N/A 01/01/2015   Procedure:  SUPRACERVICAL HYSTERECTOMY WITH BILATERAL SALPINGECTOMT  Surgeon: Everardo All Amundson de Berton Lan, MD;  Location: [facility];  Service: Gynecology;  Laterality: N/A;   BILATERAL SALPINGECTOMY Bilateral 01/01/2015   Procedure: BILATERAL SALPINGECTOMY        ;  Surgeon: Everardo All Amundson de Berton Lan, MD;  Location: [facility];  Service: Gynecology;  Laterality: Bilateral;    CESAREAN SECTION  2002   x 1   TOTAL THYROIDECTOMY  2009/2010   TUBAL LIGATION     WISDOM TOOTH EXTRACTION      reports that she has been smoking cigarettes. She has a 15.50 pack-year smoking history. She has never used smokeless tobacco. She reports current alcohol use of about 6.0 standard drinks of alcohol per week. She reports that she does not use drugs. family history includes Breast cancer in her paternal grandmother; Breast cancer (age of onset: 15) in her mother; COPD in her father; Cancer in her maternal grandmother; Colon polyps in her father; Diabetes in her father; Heart failure in her father; Hypertension in an other family member; Kidney disease in her father; Stroke in an other family member. Allergies  Allergen Reactions   Doxycycline Nausea Only   Sulfonamide Derivatives Itching   Current Outpatient Medications on File Prior to Visit  Medication Sig Dispense Refill   ALPRAZolam (XANAX) 0.5 MG tablet TAKE 1/2 TO 1 TABLET BY MOUTH DAILY AS NEEDED 30 tablet 2   Ascorbic Acid (VITAMIN C) 1000 MG tablet Take 1,000 mg by mouth daily.     betamethasone valerate ointment (VALISONE) 0.1 % Apply to vulva twice daily for 2 weeks prn. May use twice weekly for maintenance dose.  45 g 1   BIOTIN PO Take 1 tablet by mouth daily.      Cholecalciferol (VITAMIN D-3 PO) Take 1 capsule by mouth daily.     cyanocobalamin 1000 MCG tablet Take 1,000 mcg by mouth daily.     levothyroxine (SYNTHROID) 112 MCG tablet Take 1 tablet (112 mcg total) by mouth daily. 90 tablet 3   losartan (COZAAR) 100 MG tablet Take 1 tablet (100 mg total) by mouth daily. 90 tablet 3   Multiple Vitamin (MULTIVITAMIN) capsule Take 1 capsule by mouth daily.     Omega-3 Fatty Acids (FISH OIL PO) Take 1 capsule by mouth daily.      polyethylene glycol (MIRALAX) 17 g packet Take 34 g by mouth 2 (two) times daily. You may increase as needed 14 each 0   rosuvastatin (CRESTOR) 10 MG tablet TAKE 1 TABLET(10 MG) BY MOUTH DAILY 90  tablet 3   albuterol (VENTOLIN HFA) 108 (90 Base) MCG/ACT inhaler Inhale 2 puffs into the lungs every 6 (six) hours as needed for wheezing or shortness of breath. (Patient not taking: Reported on 06/23/2022) 24 g 3   omeprazole (PRILOSEC) 20 MG capsule Take 1 capsule (20 mg total) by mouth daily. (Patient not taking: Reported on 06/23/2022) 90 capsule 3   PARoxetine (PAXIL) 10 MG tablet Take 1 tablet (10 mg total) by mouth every morning. (Patient not taking: Reported on 06/23/2022) 90 tablet 3   No current facility-administered medications on file prior to visit.        ROS:  All others reviewed and negative.  Objective        PE:  BP 130/70 (BP Location: Right Arm, Patient Position: Sitting, Cuff Size: Normal)   Pulse 89   Temp 98.3 F (36.8 C) (Oral)   Ht '5\' 3"'$  (1.6 m)   Wt 176 lb (79.8 kg)   LMP 12/05/2014 (Approximate)   SpO2 97%   BMI 31.18 kg/m                 Constitutional: Pt appears in NAD               HENT: Head: NCAT.                Right Ear: External ear normal.                 Left Ear: External ear normal. Bilat tm's with mild erythema.  Max sinus areas non tender.  Pharynx with mild erythema, no exudate               Eyes: . Pupils are equal, round, and reactive to light. Conjunctivae and EOM are normal               Nose: without d/c or deformity               Neck: Neck supple. Gross normal ROM               Cardiovascular: Normal rate and regular rhythm.                 Pulmonary/Chest: Effort normal and breath sounds without rales or wheezing.                Abd:  Soft, NT, ND, + BS, no organomegaly               Neurological: Pt is alert. At baseline orientation, motor grossly intact  Skin: right > left non discrete nontender axillary area facial swelling               Psychiatric: Pt behavior is normal without agitation   Micro: none  Cardiac tracings I have personally interpreted today:  none  Pertinent Radiological findings (summarize): none    Lab Results  Component Value Date   WBC 7.9 02/23/2022   HGB 15.0 02/23/2022   HCT 44.2 02/23/2022   PLT 348.0 02/23/2022   GLUCOSE 98 02/23/2022   CHOL 172 02/23/2022   TRIG 69.0 02/23/2022   HDL 72.00 02/23/2022   LDLDIRECT 131.0 03/10/2007   LDLCALC 86 02/23/2022   ALT 18 02/23/2022   AST 19 02/23/2022   NA 139 02/23/2022   K 4.3 02/23/2022   CL 101 02/23/2022   CREATININE 0.68 02/23/2022   BUN 14 02/23/2022   CO2 27 02/23/2022   TSH 1.46 02/23/2022   HGBA1C 6.0 02/23/2022   Assessment/Plan:  Sundai S Tennis is a 56 y.o. White or Caucasian [1] female with  has a past medical history of ALLERGIC RHINITIS, Anemia, ANXIETY, DEPRESSION, Fibroid, HYPERLIPIDEMIA, Hypertension, Hypothyroidism, Lichen sclerosus of female genitalia (2018), and Thyroid carcinoma ([place]).  Angioedema Mild recurrent after recent tx - for repeat prednisone taper, declines allergy referral for now  Allergic rhinitis Also for add zyrtec, flonase and prednisone taper asd,  to f/u any worsening symptoms or concerns  Sciatica of left side Improved now resolved, exam benign, pt rassured,  to f/u any worsening symptoms or concerns  Followup: Return if symptoms worsen or fail to improve.  Cathlean Cower, MD 06/23/2022 7:48 PM [facility]

## 2022-06-23 NOTE — Assessment & Plan Note (Signed)
Improved now resolved, exam benign, pt rassured,  to f/u any worsening symptoms or concerns

## 2022-06-25 ENCOUNTER — Encounter: Payer: Self-pay | Admitting: Internal Medicine

## 2022-06-25 DIAGNOSIS — R300 Dysuria: Secondary | ICD-10-CM

## 2022-06-26 ENCOUNTER — Telehealth: Payer: Self-pay | Admitting: *Deleted

## 2022-06-26 MED ORDER — EPINEPHRINE 0.3 MG/0.3ML IJ SOAJ: 0.3000 mg | INTRAMUSCULAR | 2 refills | Status: DC | PRN

## 2022-06-26 NOTE — Addendum Note (Signed)
Addended by: Biagio Borg on: 06/26/2022 01:18 PM   Modules accepted: Orders

## 2022-06-26 NOTE — Telephone Encounter (Signed)
Pt was on cover-my-meds need PA for Epi-Pen. Submitted PA w/ (Key: [id number]) Rec'd msg Your information has been submitted to [employer] .Marland KitchenJohny Chess

## 2022-06-27 DIAGNOSIS — N3000 Acute cystitis without hematuria: Secondary | ICD-10-CM | POA: Diagnosis not present

## 2022-06-27 DIAGNOSIS — R300 Dysuria: Secondary | ICD-10-CM | POA: Diagnosis not present

## 2022-06-29 NOTE — Telephone Encounter (Signed)
Rec'd determination med was DENIED. It states " Details of this decision are provided on the physician outcome notice which has been faxed to the number on file.Marland KitchenJohny Grant

## 2022-07-07 ENCOUNTER — Other Ambulatory Visit (INDEPENDENT_AMBULATORY_CARE_PROVIDER_SITE_OTHER): Payer: BC Managed Care – PPO

## 2022-07-07 DIAGNOSIS — R300 Dysuria: Secondary | ICD-10-CM | POA: Diagnosis not present

## 2022-07-07 LAB — URINALYSIS, ROUTINE W REFLEX MICROSCOPIC
Bilirubin Urine: NEGATIVE
Hgb urine dipstick: NEGATIVE
Ketones, ur: NEGATIVE
Leukocytes,Ua: NEGATIVE
Nitrite: NEGATIVE
RBC / HPF: NONE SEEN (ref 0–?)
Specific Gravity, Urine: 1.01 (ref 1.000–1.030)
Total Protein, Urine: NEGATIVE
Urine Glucose: NEGATIVE
Urobilinogen, UA: 0.2 (ref 0.0–1.0)
pH: 6 (ref 5.0–8.0)

## 2022-07-08 LAB — URINE CULTURE

## 2022-07-15 ENCOUNTER — Encounter: Payer: Self-pay | Admitting: Internal Medicine

## 2022-07-15 ENCOUNTER — Ambulatory Visit: Payer: BC Managed Care – PPO | Admitting: Obstetrics and Gynecology

## 2022-07-16 NOTE — Telephone Encounter (Signed)
Does the patient require a visit before medication can be changed, please advise.

## 2022-07-16 NOTE — Telephone Encounter (Signed)
Needs rov please, any provider

## 2022-07-24 ENCOUNTER — Ambulatory Visit: Payer: BC Managed Care – PPO | Admitting: Family Medicine

## 2022-08-17 ENCOUNTER — Ambulatory Visit: Payer: BC Managed Care – PPO | Admitting: Family Medicine

## 2022-08-17 ENCOUNTER — Ambulatory Visit: Payer: BC Managed Care – PPO | Admitting: Internal Medicine

## 2022-08-17 VITALS — BP 126/78 | HR 86 | Temp 98.6°F | Ht 63.0 in | Wt 173.0 lb

## 2022-08-17 DIAGNOSIS — I10 Essential (primary) hypertension: Secondary | ICD-10-CM

## 2022-08-17 DIAGNOSIS — R300 Dysuria: Secondary | ICD-10-CM | POA: Insufficient documentation

## 2022-08-17 DIAGNOSIS — R739 Hyperglycemia, unspecified: Secondary | ICD-10-CM

## 2022-08-17 DIAGNOSIS — E7800 Pure hypercholesterolemia, unspecified: Secondary | ICD-10-CM

## 2022-08-17 DIAGNOSIS — F17200 Nicotine dependence, unspecified, uncomplicated: Secondary | ICD-10-CM | POA: Diagnosis not present

## 2022-08-17 MED ORDER — ALPRAZOLAM 0.5 MG PO TABS: ORAL_TABLET | ORAL | 2 refills | Status: DC

## 2022-08-17 NOTE — Assessment & Plan Note (Signed)
BP Readings from Last 3 Encounters:  08/17/22 126/78  06/23/22 130/70  03/09/22 128/84   Stable, pt to continue medical treatment losartan 100 mg qd

## 2022-08-17 NOTE — Assessment & Plan Note (Signed)
Lab Results  Component Value Date   HGBA1C 6.0 02/23/2022   Stable, pt to continue current medical treatment  - diet, wt control, exercise

## 2022-08-17 NOTE — Patient Instructions (Signed)
Please continue all other medications as before, and refills have been done if requested.  Please have the pharmacy call with any other refills you may need.  Please continue your efforts at being more active, low cholesterol diet, and weight control.  Please keep your appointments with your specialists as you may have planned  Please go to the LAB at the blood drawing area for the tests to be done - just the urine testing today  You will be contacted by phone if any changes need to be made immediately.  Otherwise, you will receive a letter about your results with an explanation, but please check with MyChart first.  Please remember to sign up for MyChart if you have not done so, as this will be important to you in the future with finding out test results, communicating by private email, and scheduling acute appointments online when needed.  Please make an Appointment to return in 6 months, or sooner if needed 

## 2022-08-17 NOTE — Assessment & Plan Note (Signed)
counsled to quit, pt not ready

## 2022-08-17 NOTE — Progress Notes (Signed)
Patient ID: [name], female   DOB: 29-Aug-1966, 56 y.o.   MRN: [id number]        Chief Complaint: follow up HTN, HLD and hyperglycemia, dysuria        HPI:  [name] is a 56 y.o. female hereoverall doing ok.  Taking azo daily for several days, c/o new onset dysuria but Denies urinary symptoms such as frequency, urgency, flank pain, hematuria or n/v, fever, chills. Has appt with urology    [name] denies chest pain, increased sob or doe, wheezing, orthopnea, PND, increased LE swelling, palpitations, dizziness or syncope.  [name] denies fever, wt loss, night sweats, loss of appetite, or other constitutional symptoms         Wt Readings from Last 3 Encounters:  08/17/22 173 lb (78.5 kg)  06/23/22 176 lb (79.8 kg)  03/09/22 178 lb (80.7 kg)   BP Readings from Last 3 Encounters:  08/17/22 126/78  06/23/22 130/70  03/09/22 128/84         Past Medical History:  Diagnosis Date   ALLERGIC RHINITIS    Anemia    ANXIETY    DEPRESSION    Fibroid    HYPERLIPIDEMIA    diet controlled, no meds   Hypertension    Hypothyroidism    Lichen sclerosus of female genitalia 2018   Thyroid carcinoma [facility])    Past Surgical History:  Procedure Laterality Date   ABDOMINAL HYSTERECTOMY N/A 01/01/2015   Procedure:  SUPRACERVICAL HYSTERECTOMY WITH BILATERAL SALPINGECTOMT  Surgeon: Everardo All Amundson de Berton Lan, MD;  Location: [facility];  Service: Gynecology;  Laterality: N/A;   BILATERAL SALPINGECTOMY Bilateral 01/01/2015   Procedure: BILATERAL SALPINGECTOMY        ;  Surgeon: Everardo All Amundson de Berton Lan, MD;  Location: [facility];  Service: Gynecology;  Laterality: Bilateral;   CESAREAN SECTION  2002   x 1   TOTAL THYROIDECTOMY  2009/2010   TUBAL LIGATION     WISDOM TOOTH EXTRACTION      reports that she has been smoking cigarettes. She has a 15.50 pack-year smoking history. She has never used smokeless tobacco. She reports current alcohol use of about 6.0 standard drinks of alcohol per  week. She reports that she does not use drugs. family history includes Breast cancer in her paternal grandmother; Breast cancer (age of onset: 22) in her mother; COPD in her father; Cancer in her maternal grandmother; Colon polyps in her father; Diabetes in her father; Heart failure in her father; Hypertension in an other family member; Kidney disease in her father; Stroke in an other family member. Allergies  Allergen Reactions   Doxycycline Nausea Only   Sulfonamide Derivatives Itching   Current Outpatient Medications on File Prior to Visit  Medication Sig Dispense Refill   Ascorbic Acid (VITAMIN C) 1000 MG tablet Take 1,000 mg by mouth daily.     betamethasone valerate ointment (VALISONE) 0.1 % Apply to vulva twice daily for 2 weeks prn. May use twice weekly for maintenance dose. 45 g 1   BIOTIN PO Take 1 tablet by mouth daily.      cetirizine (ZYRTEC) 10 MG tablet Take 1 tablet (10 mg total) by mouth daily. 30 tablet 11   Cholecalciferol (VITAMIN D-3 PO) Take 1 capsule by mouth daily.     clindamycin (CLEOCIN) 300 MG capsule Take 300 mg by mouth every 12 (twelve) hours.     cyanocobalamin 1000 MCG tablet Take 1,000 mcg by mouth daily.  EPINEPHrine 0.3 mg/0.3 mL IJ SOAJ injection Inject 0.3 mg into the muscle as needed for anaphylaxis. 1 each 2   fluticasone (FLONASE) 50 MCG/ACT nasal spray Place 2 sprays into both nostrils daily. 16 g 6   levothyroxine (SYNTHROID) 112 MCG tablet Take 1 tablet (112 mcg total) by mouth daily. 90 tablet 3   losartan (COZAAR) 100 MG tablet Take 1 tablet (100 mg total) by mouth daily. 90 tablet 3   Multiple Vitamin (MULTIVITAMIN) capsule Take 1 capsule by mouth daily.     Omega-3 Fatty Acids (FISH OIL PO) Take 1 capsule by mouth daily.      polyethylene glycol (MIRALAX) 17 g packet Take 34 g by mouth 2 (two) times daily. You may increase as needed 14 each 0   rosuvastatin (CRESTOR) 10 MG tablet TAKE 1 TABLET(10 MG) BY MOUTH DAILY 90 tablet 3   traMADol  (ULTRAM) 50 MG tablet Take 50-100 mg by mouth every 6 (six) hours as needed.     No current facility-administered medications on file prior to visit.        ROS:  All others reviewed and negative.  Objective        PE:  BP 126/78 (BP Location: Left Arm, Patient Position: Sitting, Cuff Size: Large)   Pulse 86   Temp 98.6 F (37 C) (Oral)   Ht '5\' 3"'$  (1.6 m)   Wt 173 lb (78.5 kg)   LMP 12/05/2014 (Approximate)   SpO2 93%   BMI 30.65 kg/m                 Constitutional: [name] appears in NAD               HENT: Head: NCAT.                Right Ear: External ear normal.                 Left Ear: External ear normal.                Eyes: . Pupils are equal, round, and reactive to light. Conjunctivae and EOM are normal               Nose: without d/c or deformity               Neck: Neck supple. Gross normal ROM               Cardiovascular: Normal rate and regular rhythm.                 Pulmonary/Chest: Effort normal and breath sounds without rales or wheezing.                Abd:  Soft, NT, ND, + BS, no organomegaly               Neurological: [name] is alert. At baseline orientation, motor grossly intact               Skin: Skin is warm. No rashes, no other new lesions, LE edema - none               Psychiatric: [name] behavior is normal without agitation   Micro: none  Cardiac tracings I have personally interpreted today:  none  Pertinent Radiological findings (summarize): none   Lab Results  Component Value Date   WBC 7.9 02/23/2022   HGB 15.0 02/23/2022   HCT 44.2 02/23/2022   PLT 348.0 02/23/2022   GLUCOSE  98 02/23/2022   CHOL 172 02/23/2022   TRIG 69.0 02/23/2022   HDL 72.00 02/23/2022   LDLDIRECT 131.0 03/10/2007   LDLCALC 86 02/23/2022   ALT 18 02/23/2022   AST 19 02/23/2022   NA 139 02/23/2022   K 4.3 02/23/2022   CL 101 02/23/2022   CREATININE 0.68 02/23/2022   BUN 14 02/23/2022   CO2 27 02/23/2022   TSH 1.46 02/23/2022   HGBA1C 6.0 02/23/2022    Assessment/Plan:  [name] is a 56 y.o. White or Caucasian [1] female with  has a past medical history of ALLERGIC RHINITIS, Anemia, ANXIETY, DEPRESSION, Fibroid, HYPERLIPIDEMIA, Hypertension, Hypothyroidism, Lichen sclerosus of female genitalia (2018), and Thyroid carcinoma ([place]).  Smoker counsled to quit, [name] not ready  Hyperglycemia Lab Results  Component Value Date   HGBA1C 6.0 02/23/2022   Stable, [name] to continue current medical treatment  - diet, wt control, exercise   Hyperlipidemia Lab Results  Component Value Date   LDLCALC 86 02/23/2022   Stable, [name] to continue current statin crestor 10 mg qd   HTN (hypertension) BP Readings from Last 3 Encounters:  08/17/22 126/78  06/23/22 130/70  03/09/22 128/84   Stable, [name] to continue medical treatment losartan 100 mg qd   Dysuria Recent onset with lower abd pressurre, for urine studies but if culture is negative, [name] encouraged to keep oct 25 urology appt  Followup: No follow-ups on file.  Cathlean Cower, MD 08/17/2022 3:51 PM [facility]

## 2022-08-17 NOTE — Assessment & Plan Note (Signed)
Recent onset with lower abd pressurre, for urine studies but if culture is negative, pt encouraged to keep oct 25 urology appt

## 2022-08-17 NOTE — Assessment & Plan Note (Signed)
Lab Results  Component Value Date   LDLCALC 86 02/23/2022   Stable, pt to continue current statin crestor 10 mg qd

## 2022-08-18 ENCOUNTER — Other Ambulatory Visit: Payer: Self-pay | Admitting: Internal Medicine

## 2022-08-18 LAB — URINALYSIS, ROUTINE W REFLEX MICROSCOPIC
Bilirubin Urine: NEGATIVE
Hgb urine dipstick: NEGATIVE
Nitrite: NEGATIVE
RBC / HPF: NONE SEEN (ref 0–?)
Specific Gravity, Urine: <=1.005 — AB (ref 1.000–1.030)
Total Protein, Urine: NEGATIVE
Urine Glucose: NEGATIVE
Urobilinogen, UA: 0.2 (ref 0.0–1.0)
pH: 6 (ref 5.0–8.0)

## 2022-08-18 MED ORDER — CEPHALEXIN 500 MG PO CAPS: 500.0000 mg | ORAL_CAPSULE | Freq: Three times a day (TID) | ORAL | 0 refills | Status: DC

## 2022-08-20 LAB — URINE CULTURE

## 2022-08-27 ENCOUNTER — Encounter: Payer: Self-pay | Admitting: Obstetrics and Gynecology

## 2022-08-27 ENCOUNTER — Other Ambulatory Visit: Payer: Self-pay | Admitting: Obstetrics and Gynecology

## 2022-08-27 ENCOUNTER — Ambulatory Visit (INDEPENDENT_AMBULATORY_CARE_PROVIDER_SITE_OTHER): Payer: BC Managed Care – PPO | Admitting: Obstetrics and Gynecology

## 2022-08-27 VITALS — BP 124/80 | Ht 63.0 in | Wt 172.0 lb

## 2022-08-27 DIAGNOSIS — Z01419 Encounter for gynecological examination (general) (routine) without abnormal findings: Secondary | ICD-10-CM | POA: Diagnosis not present

## 2022-08-27 DIAGNOSIS — Z1231 Encounter for screening mammogram for malignant neoplasm of breast: Secondary | ICD-10-CM

## 2022-08-27 DIAGNOSIS — Z1159 Encounter for screening for other viral diseases: Secondary | ICD-10-CM | POA: Diagnosis not present

## 2022-08-27 DIAGNOSIS — Z23 Encounter for immunization: Secondary | ICD-10-CM | POA: Diagnosis not present

## 2022-08-27 MED ORDER — BETAMETHASONE VALERATE 0.1 % EX OINT: TOPICAL_OINTMENT | CUTANEOUS | 0 refills | Status: DC

## 2022-08-27 NOTE — Progress Notes (Signed)
56 y.o. G70P1001 Married Caucasian female here for annual exam.    She has been having pelvic pressure and frequent voiding.  Taking Keflex fro E Coli UTI.  Using valisone ointment as needed.   Doing ok off Paxil.  Taking Xanax as needed.  Stress with her father's health.  Her son is doing well.   PCP:   Jessica Cower, MD  Patient's last menstrual period was 12/05/2014 (approximate).           Sexually active: No.  The current method of family planning is status post hysterectomy.    Exercising: No.     Smoker:  yes  Health Maintenance: Pap:  01/02/2019 negative, neg HR HPV. 12-29-17 Neg:Neg HR HPV, 11-06-16 Neg:Neg HR HPV History of abnormal Pap:  yes Pap 09/28/13 - ASCUS and positive HR HPV, Colpo 10/25/13 - ECC Benign.  Pap 10/01/14 - ASCUS, neg HR HPV. Pap 04/11/15 - ASCUS and neg HR HPV.   MMG:  09/30/2021 BI-RADS CATEGORY  1: Negative.  Has mammogram scheduled.  Colonoscopy: 09/23/2021 - polyps - needs follow up 7 years. BMD:   n/a Result  n/a TDaP:  2011.  Will do today.  Gardasil:   no HIV:  09/15/17 NR Hep C:   today Screening Labs:  PCP and endocrinology.   Flu vaccine:  Will do today.    reports that she has been smoking cigarettes. She has a 15.50 pack-year smoking history. She has never used smokeless tobacco. She reports current alcohol use of about 6.0 standard drinks of alcohol per week. She reports that she does not use drugs.  Past Medical History:  Diagnosis Date   ALLERGIC RHINITIS    Anemia    ANXIETY    DEPRESSION    Fibroid    HYPERLIPIDEMIA    diet controlled, no meds   Hypertension    Hypothyroidism    Lichen sclerosus of female genitalia 2018   Thyroid carcinoma [facility])     Past Surgical History:  Procedure Laterality Date   ABDOMINAL HYSTERECTOMY N/A 01/01/2015   Procedure:  SUPRACERVICAL HYSTERECTOMY WITH BILATERAL SALPINGECTOMT  Surgeon: Jamey Reas de Berton Lan, MD;  Location: [facility];  Service: Gynecology;  Laterality: N/A;    BILATERAL SALPINGECTOMY Bilateral 01/01/2015   Procedure: BILATERAL SALPINGECTOMY        ;  Surgeon: Everardo All Amundson de Berton Lan, MD;  Location: [facility];  Service: Gynecology;  Laterality: Bilateral;   CESAREAN SECTION  2002   x 1   TOTAL THYROIDECTOMY  2009/2010   TUBAL LIGATION     WISDOM TOOTH EXTRACTION      Current Outpatient Medications  Medication Sig Dispense Refill   ALPRAZolam (XANAX) 0.5 MG tablet TAKE 1/2 TO 1 TABLET BY MOUTH DAILY AS NEEDED 30 tablet 2   Ascorbic Acid (VITAMIN C) 1000 MG tablet Take 1,000 mg by mouth daily.     betamethasone valerate ointment (VALISONE) 0.1 % Apply to vulva twice daily for 2 weeks prn. May use twice weekly for maintenance dose. 45 g 1   BIOTIN PO Take 1 tablet by mouth daily.      cephALEXin (KEFLEX) 500 MG capsule Take 1 capsule (500 mg total) by mouth 3 (three) times daily. 30 capsule 0   cetirizine (ZYRTEC) 10 MG tablet Take 1 tablet (10 mg total) by mouth daily. 30 tablet 11   Cholecalciferol (VITAMIN D-3 PO) Take 1 capsule by mouth daily.     clindamycin (CLEOCIN) 300 MG capsule Take  300 mg by mouth every 12 (twelve) hours.     cyanocobalamin 1000 MCG tablet Take 1,000 mcg by mouth daily.     EPINEPHrine 0.3 mg/0.3 mL IJ SOAJ injection Inject 0.3 mg into the muscle as needed for anaphylaxis. 1 each 2   fluticasone (FLONASE) 50 MCG/ACT nasal spray Place 2 sprays into both nostrils daily. 16 g 6   levothyroxine (SYNTHROID) 112 MCG tablet Take 1 tablet (112 mcg total) by mouth daily. 90 tablet 3   losartan (COZAAR) 100 MG tablet Take 1 tablet (100 mg total) by mouth daily. 90 tablet 3   Multiple Vitamin (MULTIVITAMIN) capsule Take 1 capsule by mouth daily.     Omega-3 Fatty Acids (FISH OIL PO) Take 1 capsule by mouth daily.      polyethylene glycol (MIRALAX) 17 g packet Take 34 g by mouth 2 (two) times daily. You may increase as needed 14 each 0   rosuvastatin (CRESTOR) 10 MG tablet TAKE 1 TABLET(10 MG) BY MOUTH DAILY 90 tablet 3    traMADol (ULTRAM) 50 MG tablet Take 50-100 mg by mouth every 6 (six) hours as needed.     No current facility-administered medications for this visit.    Family History  Problem Relation Age of Onset   Breast cancer Mother 73   Colon polyps Father    Diabetes Father    COPD Father    Heart failure Father    Kidney disease Father    Cancer Maternal Grandmother    Breast cancer Paternal Grandmother    Hypertension Other    Stroke Other    Colon cancer Neg Hx    Esophageal cancer Neg Hx    Stomach cancer Neg Hx    Rectal cancer Neg Hx     Review of Systems  All other systems reviewed and are negative.   Exam:   BP 124/80 (BP Location: Right Arm, Patient Position: Sitting, Cuff Size: Normal)   Ht '5\' 3"'$  (1.6 m)   Wt 172 lb (78 kg)   LMP 12/05/2014 (Approximate)   BMI 30.47 kg/m     General appearance: alert, cooperative and appears stated age Head: normocephalic, without obvious abnormality, atraumatic Neck: no adenopathy, supple, symmetrical, trachea midline and thyroid normal to inspection and palpation Lungs: clear to auscultation bilaterally Breasts: normal appearance, no masses or tenderness, No nipple retraction or dimpling, No nipple discharge or bleeding, No axillary adenopathy Heart: regular rate and rhythm Abdomen: soft, non-tender; no masses, no organomegaly Extremities: extremities normal, atraumatic, no cyanosis or edema Skin: skin color, texture, turgor normal. No rashes or lesions Lymph nodes: cervical, supraclavicular, and axillary nodes normal. Neurologic: grossly normal  Pelvic: External genitalia:  pale coloration of the bilateral medial labia minora and perineum.              No abnormal inguinal nodes palpated.              Urethra:  normal appearing urethra with no masses, tenderness or lesions              Bartholins and Skenes: normal                 Vagina: normal appearing vagina with normal color and discharge, no lesions.  Atrophy noted.                Cervix: no lesions              Pap taken: no Bimanual Exam:  Uterus: absent  Adnexa: no mass, fullness, tenderness              Rectal exam: declined.   Chaperone was present for exam:  Kimalexis, CMA  Assessment:   Well woman visit with gynecologic exam. Hx bladder cyst versus septum seen on [place] in December 2022. Had supracervical hysterectomy 01/01/15. 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  Hx recurrent ASCUS paps.  Smoker.   Lichen sclerosus.  Hx thyroid cancer.  FH breast cancer in mother.   Plan: Mammogram screening discussed. Self breast awareness reviewed. Pap and HR HPV 2025 Guidelines for Calcium, Vitamin D, regular exercise program including cardiovascular and weight bearing exercise. TDap and flu vaccine.  Hep C aby.  Refill of Valisone ointment.  Follow up annually and prn.   After visit summary provided.

## 2022-08-27 NOTE — Patient Instructions (Addendum)
Preventive Care 40-55 Years Old, Female Preventive care refers to lifestyle choices and visits with your health care provider that can promote health and wellness. Preventive care visits are also called wellness exams. What can I expect for my preventive care visit? Counseling Your health care provider may ask you questions about your: Medical history, including: Past medical problems. Family medical history. Pregnancy history. Current health, including: Menstrual cycle. Method of birth control. Emotional well-being. Home life and relationship well-being. Sexual activity and sexual health. Lifestyle, including: Alcohol, nicotine or tobacco, and drug use. Access to firearms. Diet, exercise, and sleep habits. Work and work environment. Sunscreen use. Safety issues such as seatbelt and bike helmet use. Physical exam Your health care provider will check your: Height and weight. These may be used to calculate your BMI (body mass index). BMI is a measurement that tells if you are at a healthy weight. Waist circumference. This measures the distance around your waistline. This measurement also tells if you are at a healthy weight and may help predict your risk of certain diseases, such as type 2 diabetes and high blood pressure. Heart rate and blood pressure. Body temperature. Skin for abnormal spots. What immunizations do I need?  Vaccines are usually given at various ages, according to a schedule. Your health care provider will recommend vaccines for you based on your age, medical history, and lifestyle or other factors, such as travel or where you work. What tests do I need? Screening Your health care provider may recommend screening tests for certain conditions. This may include: Lipid and cholesterol levels. Diabetes screening. This is done by checking your blood sugar (glucose) after you have not eaten for a while (fasting). Pelvic exam and Pap test. Hepatitis B test. Hepatitis C  test. HIV (human immunodeficiency virus) test. STI (sexually transmitted infection) testing, if you are at risk. Lung cancer screening. Colorectal cancer screening. Mammogram. Talk with your health care provider about when you should start having regular mammograms. This may depend on whether you have a family history of breast cancer. BRCA-related cancer screening. This may be done if you have a family history of breast, ovarian, tubal, or peritoneal cancers. Bone density scan. This is done to screen for osteoporosis. Talk with your health care provider about your test results, treatment options, and if necessary, the need for more tests. Follow these instructions at home: Eating and drinking  Eat a diet that includes fresh fruits and vegetables, whole grains, lean protein, and low-fat dairy products. Take vitamin and mineral supplements as recommended by your health care provider. Do not drink alcohol if: Your health care provider tells you not to drink. You are pregnant, may be pregnant, or are planning to become pregnant. If you drink alcohol: Limit how much you have to 0-1 drink a day. Know how much alcohol is in your drink. In the [place] one drink equals one 12 oz bottle of beer (355 mL), one 5 oz glass of wine (148 mL), or one 1 oz glass of hard liquor (44 mL). Lifestyle Brush your teeth every morning and night with fluoride toothpaste. Floss one time each day. Exercise for at least 30 minutes 5 or more days each week. Do not use any products that contain nicotine or tobacco. These products include cigarettes, chewing tobacco, and vaping devices, such as e-cigarettes. If you need help quitting, ask your health care provider. Do not use drugs. If you are sexually active, practice safe sex. Use a condom or other form of protection to   prevent STIs. If you do not wish to become pregnant, use a form of birth control. If you plan to become pregnant, see your health care provider for a  prepregnancy visit. Take aspirin only as told by your health care provider. Make sure that you understand how much to take and what form to take. Work with your health care provider to find out whether it is safe and beneficial for you to take aspirin daily. Find healthy ways to manage stress, such as: Meditation, yoga, or listening to music. Journaling. Talking to a trusted person. Spending time with friends and family. Minimize exposure to UV radiation to reduce your risk of skin cancer. Safety Always wear your seat belt while driving or riding in a vehicle. Do not drive: If you have been drinking alcohol. Do not ride with someone who has been drinking. When you are tired or distracted. While texting. If you have been using any mind-altering substances or drugs. Wear a helmet and other protective equipment during sports activities. If you have firearms in your house, make sure you follow all gun safety procedures. Seek help if you have been physically or sexually abused. What's next? Visit your health care provider once a year for an annual wellness visit. Ask your health care provider how often you should have your eyes and teeth checked. Stay up to date on all vaccines. This information is not intended to replace advice given to you by your health care provider. Make sure you discuss any questions you have with your health care provider. Document Revised: 04/30/2021 Document Reviewed: 04/30/2021 Elsevier Patient Education  2023 Elsevier Inc.  

## 2022-08-28 ENCOUNTER — Encounter: Payer: Self-pay | Admitting: Internal Medicine

## 2022-08-28 DIAGNOSIS — R300 Dysuria: Secondary | ICD-10-CM

## 2022-08-28 LAB — HEPATITIS C ANTIBODY: Hepatitis C Ab: NONREACTIVE

## 2022-08-31 NOTE — Telephone Encounter (Signed)
PCCs to see pt concern

## 2022-09-02 IMAGING — MG MM DIGITAL SCREENING BILAT W/ TOMO AND CAD
8 series · 8 of 24 positions shown · non-contrast
Comparison: Previous exam(s).

CLINICAL DATA: Screening.

EXAM:
DIGITAL SCREENING BILATERAL MAMMOGRAM WITH TOMOSYNTHESIS AND CAD
TECHNIQUE: Bilateral screening digital craniocaudal and mediolateral oblique
mammograms were obtained. Bilateral screening digital breast
tomosynthesis was performed. The images were evaluated with
computer-aided detection.

[R MLO synth-2D]
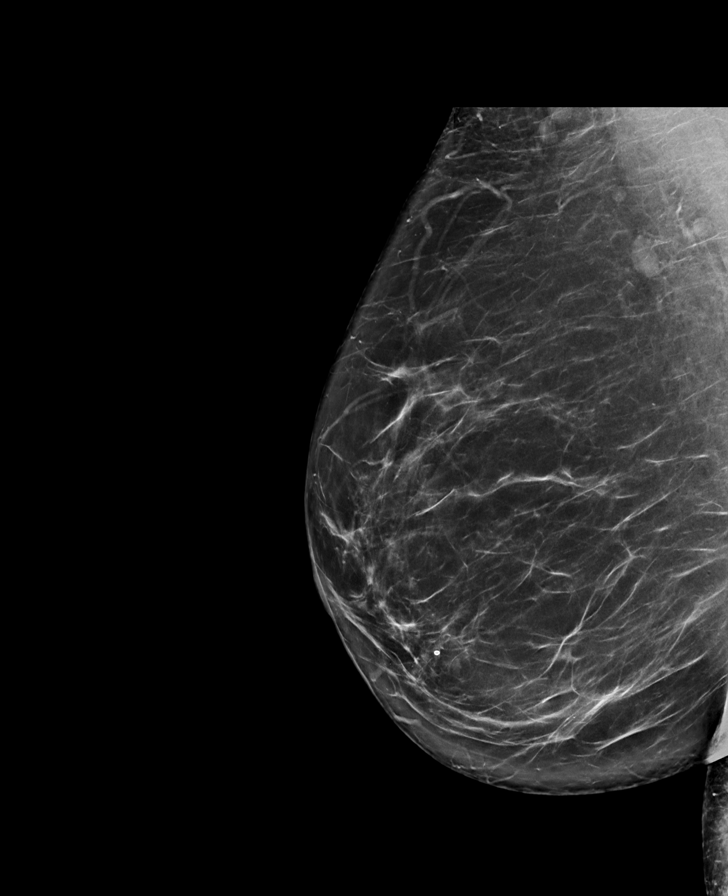

[L CC synth-2D]
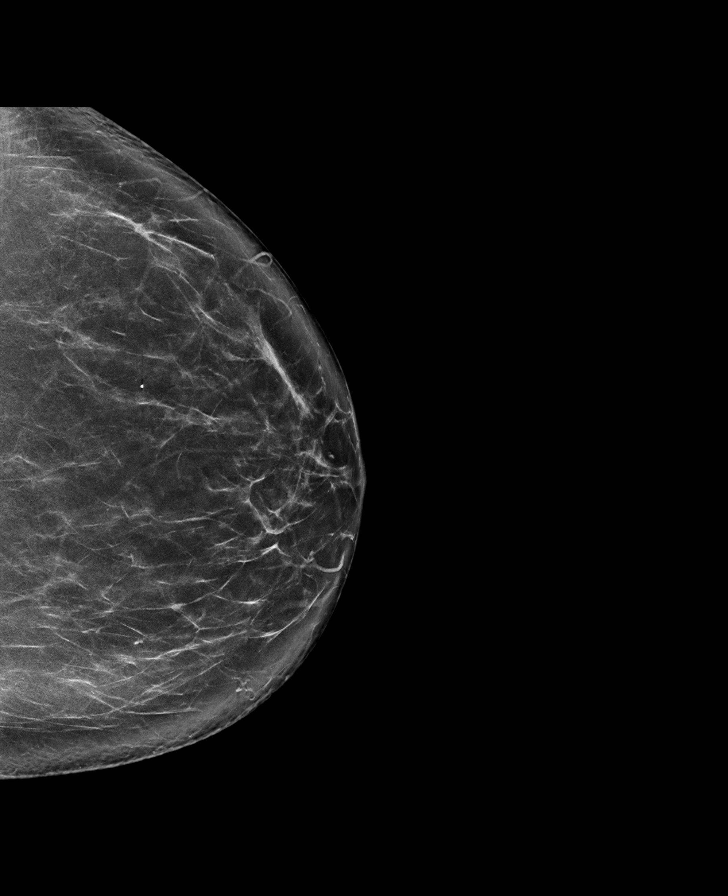

[R CC synth-2D]
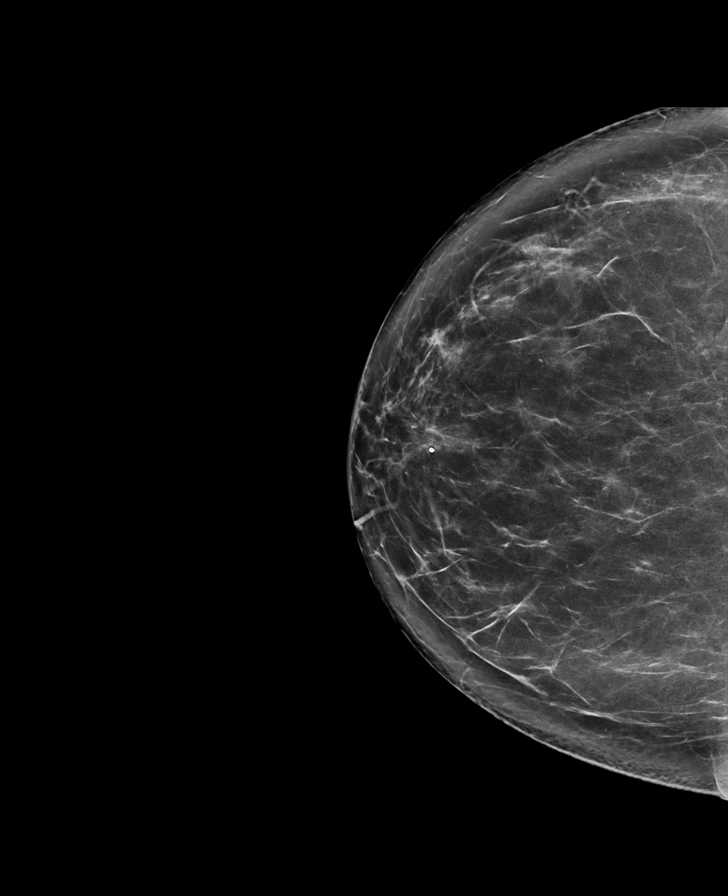

[L MLO synth-2D]
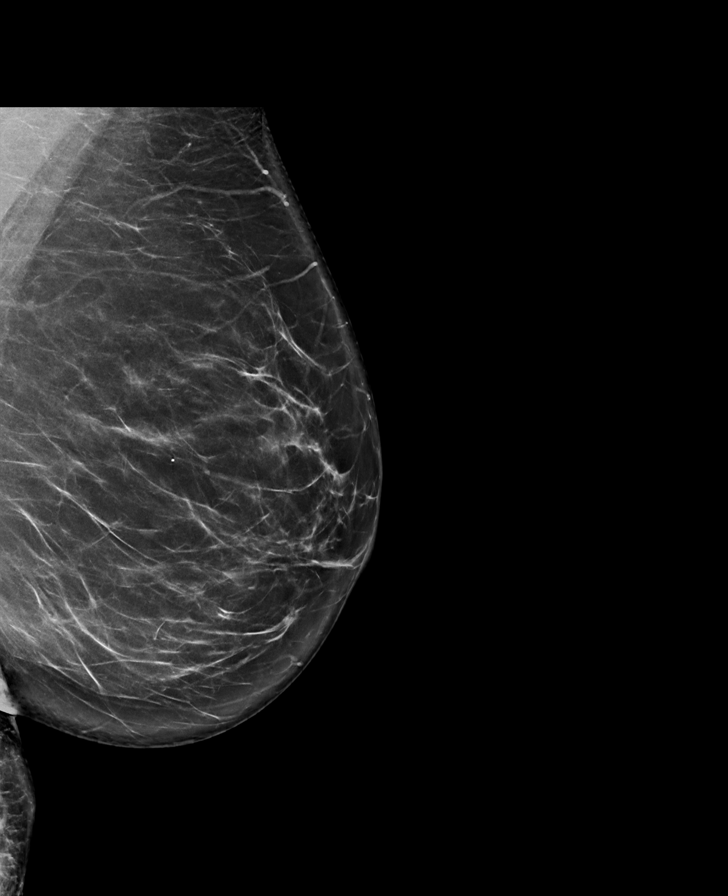

[R CC tomo · tomo slice 45/88.0]
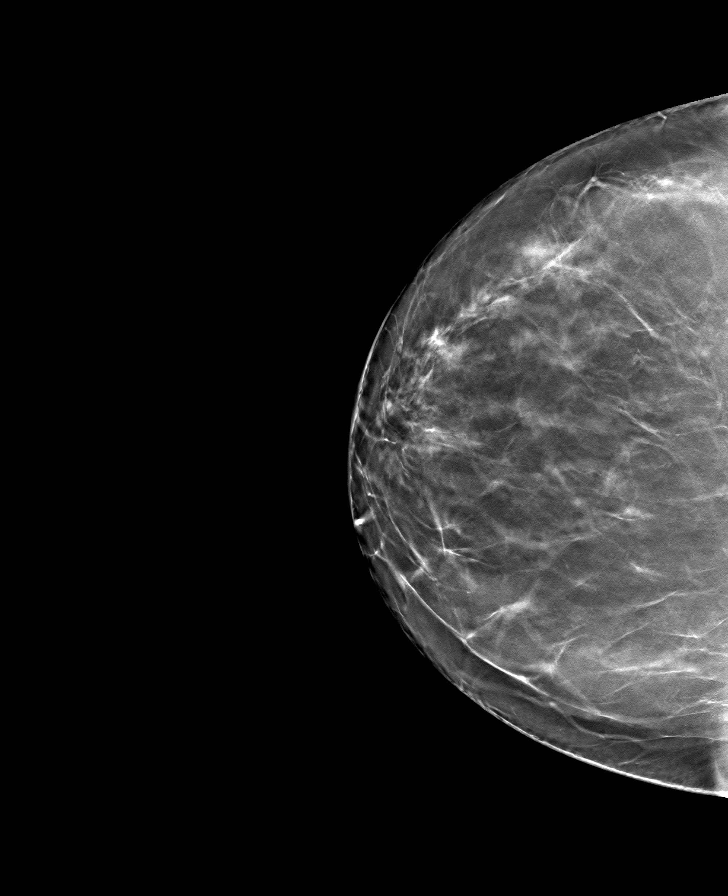

[L CC tomo · tomo slice 44/87.0]
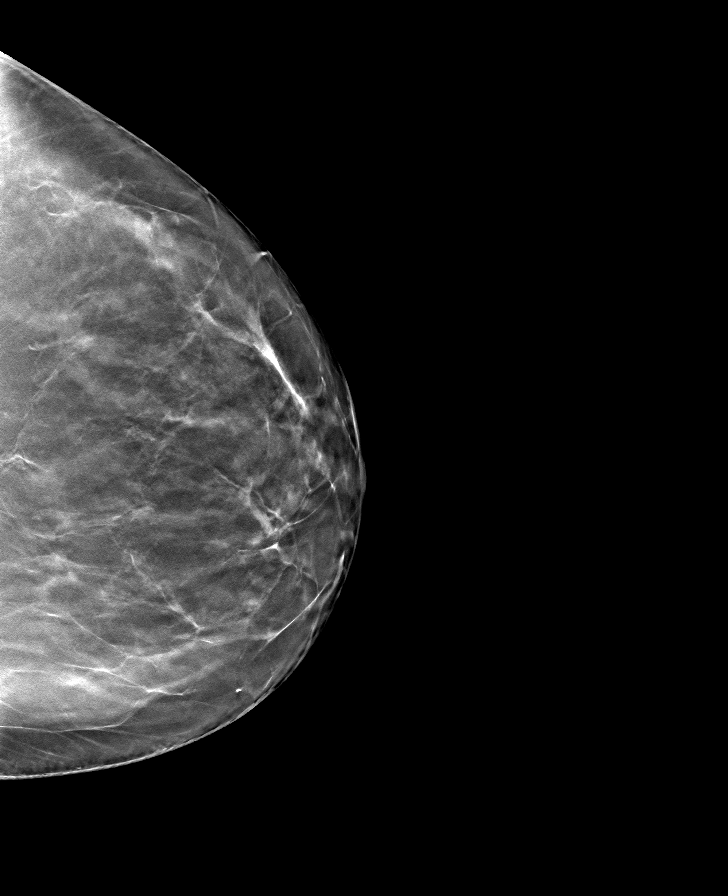

[R MLO tomo · tomo slice 47/92.0]
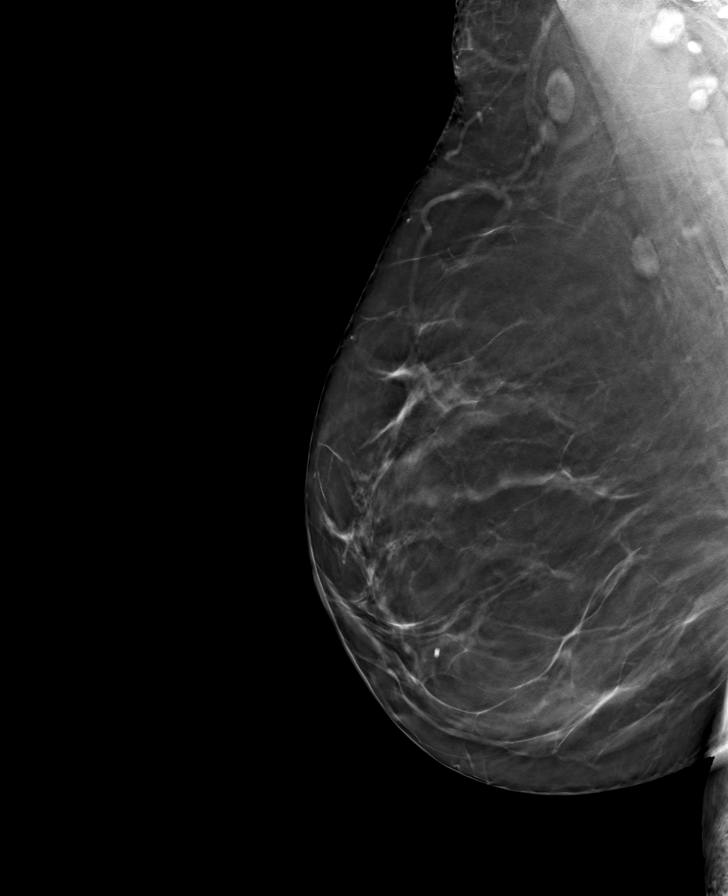

[L MLO tomo · tomo slice 46/91.0]
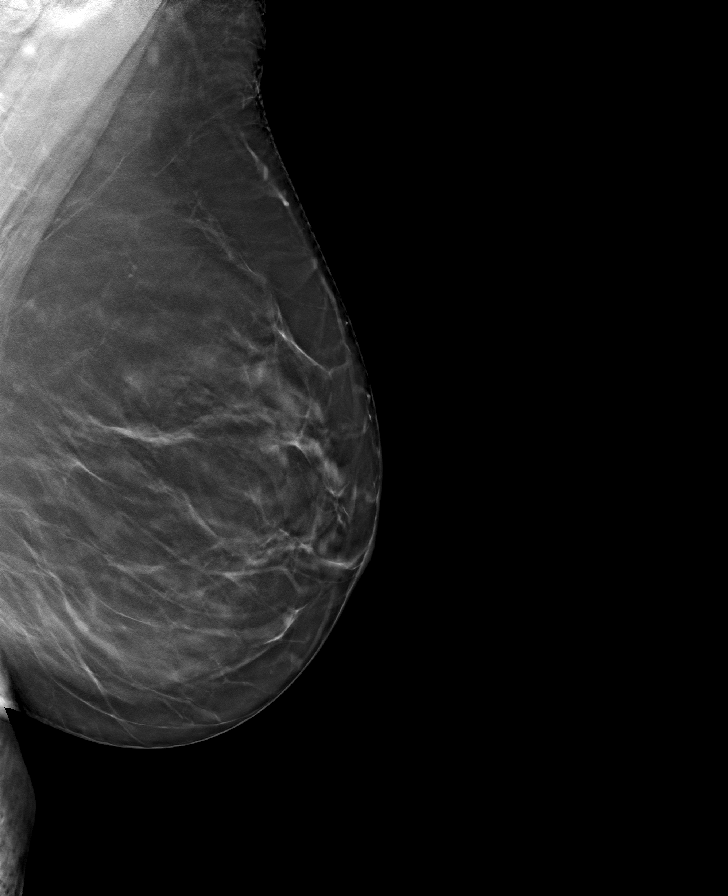

[8 of 24 positions shown; findings below may reference images not displayed]

ACR Breast Density Category b: There are scattered areas of
fibroglandular density.
FINDINGS: There are no findings suspicious for malignancy.
IMPRESSION: No mammographic evidence of malignancy. A result letter of this
screening mammogram will be mailed directly to the patient.

RECOMMENDATION:
Screening mammogram in one year. (Code:51-O-LD2)

BI-RADS CATEGORY  1: Negative.

## 2022-09-09 DIAGNOSIS — R3915 Urgency of urination: Secondary | ICD-10-CM | POA: Diagnosis not present

## 2022-09-09 DIAGNOSIS — R102 Pelvic and perineal pain: Secondary | ICD-10-CM | POA: Diagnosis not present

## 2022-09-17 ENCOUNTER — Other Ambulatory Visit: Payer: Self-pay | Admitting: Obstetrics and Gynecology

## 2022-09-18 NOTE — Telephone Encounter (Signed)
Last exam was 10/23

## 2022-10-02 ENCOUNTER — Ambulatory Visit: Payer: BC Managed Care – PPO

## 2022-10-06 ENCOUNTER — Ambulatory Visit: Payer: BC Managed Care – PPO

## 2022-10-13 ENCOUNTER — Encounter: Payer: Self-pay | Admitting: Internal Medicine

## 2022-10-15 MED ORDER — AMLODIPINE BESYLATE 5 MG PO TABS: 5.0000 mg | ORAL_TABLET | Freq: Every day | ORAL | 3 refills | Status: DC

## 2022-10-15 NOTE — Telephone Encounter (Signed)
Patient has been tracking bp, no other concerns but please advise if you'd like for her to schedule a visit.

## 2022-10-15 NOTE — Telephone Encounter (Signed)
As this is mildly high, please start amlodipine 5 mg per day - continue to monitor BP at home as she does- sent to [place] so she can start now

## 2022-10-16 NOTE — Telephone Encounter (Signed)
No, she would need to take both

## 2022-10-16 NOTE — Telephone Encounter (Signed)
Patient has gotten amlodipine but would like to know if she should stop losartan or can they be taken together.

## 2022-11-22 ENCOUNTER — Other Ambulatory Visit: Payer: Self-pay | Admitting: Internal Medicine

## 2022-11-22 NOTE — Telephone Encounter (Signed)
Please refill as per office routine med refill policy (all routine meds to be refilled for 3 mo or monthly (per pt preference) up to one year from last visit, then month to month grace period for 3 mo, then further med refills will have to be denied) ? ?

## 2022-12-01 ENCOUNTER — Ambulatory Visit: Payer: BC Managed Care – PPO

## 2022-12-10 ENCOUNTER — Ambulatory Visit
Admission: RE | Admit: 2022-12-10 | Discharge: 2022-12-10 | Disposition: A | Discharge status: Home / Self Care | Payer: BC Managed Care – PPO | Source: Ambulatory Visit | Attending: Obstetrics and Gynecology | Admitting: Obstetrics and Gynecology

## 2022-12-10 DIAGNOSIS — D2271 Melanocytic nevi of right lower limb, including hip: Secondary | ICD-10-CM | POA: Diagnosis not present

## 2022-12-10 DIAGNOSIS — D485 Neoplasm of uncertain behavior of skin: Secondary | ICD-10-CM | POA: Diagnosis not present

## 2022-12-10 DIAGNOSIS — D225 Melanocytic nevi of trunk: Secondary | ICD-10-CM | POA: Diagnosis not present

## 2022-12-10 DIAGNOSIS — L814 Other melanin hyperpigmentation: Secondary | ICD-10-CM | POA: Diagnosis not present

## 2022-12-10 DIAGNOSIS — L821 Other seborrheic keratosis: Secondary | ICD-10-CM | POA: Diagnosis not present

## 2022-12-10 DIAGNOSIS — Z1231 Encounter for screening mammogram for malignant neoplasm of breast: Secondary | ICD-10-CM

## 2022-12-10 DIAGNOSIS — D2261 Melanocytic nevi of right upper limb, including shoulder: Secondary | ICD-10-CM | POA: Diagnosis not present

## 2022-12-10 DIAGNOSIS — L570 Actinic keratosis: Secondary | ICD-10-CM | POA: Diagnosis not present

## 2022-12-17 ENCOUNTER — Ambulatory Visit (INDEPENDENT_AMBULATORY_CARE_PROVIDER_SITE_OTHER): Payer: BC Managed Care – PPO

## 2022-12-17 ENCOUNTER — Encounter: Payer: Self-pay | Admitting: Internal Medicine

## 2022-12-17 ENCOUNTER — Encounter: Payer: Self-pay | Admitting: Family Medicine

## 2022-12-17 ENCOUNTER — Ambulatory Visit: Payer: BC Managed Care – PPO | Admitting: Family Medicine

## 2022-12-17 VITALS — BP 124/86 | HR 97 | Temp 97.6°F | Ht 63.0 in | Wt 178.0 lb

## 2022-12-17 DIAGNOSIS — R059 Cough, unspecified: Secondary | ICD-10-CM | POA: Diagnosis not present

## 2022-12-17 DIAGNOSIS — R053 Chronic cough: Secondary | ICD-10-CM

## 2022-12-17 DIAGNOSIS — R0781 Pleurodynia: Secondary | ICD-10-CM

## 2022-12-17 DIAGNOSIS — R071 Chest pain on breathing: Secondary | ICD-10-CM | POA: Diagnosis not present

## 2022-12-17 DIAGNOSIS — R058 Other specified cough: Secondary | ICD-10-CM

## 2022-12-17 DIAGNOSIS — R0789 Other chest pain: Secondary | ICD-10-CM | POA: Diagnosis not present

## 2022-12-17 MED ORDER — AZITHROMYCIN 250 MG PO TABS: ORAL_TABLET | ORAL | 0 refills | Status: AC

## 2022-12-17 NOTE — Patient Instructions (Signed)
Please go downstairs for x-rays before you leave.  Take the antibiotic as prescribed  Treat your cough with Mucinex or Delsym.  I recommend using a heating pad or ice pack as well as topical over-the-counter pain medication such as Salonpas with lidocaine, lidocaine patches, Voltaren gel.  Take Tylenol or ibuprofen for pain as needed.  We will be in touch with your x-ray results.

## 2022-12-17 NOTE — Telephone Encounter (Signed)
Patient scheduled to see Vickie 12/17/22 at 2:40

## 2022-12-17 NOTE — Progress Notes (Signed)
Subjective:  [name] is a 57 y.o. female who presents for left rib pain worse with coughing, deep breaths and movement. She has been coughing x 3 wks. Coughing up yellowish sputum. No fever, dizziness, chest pain, shortness of breath, abdominal pain, N/V/D   No other aggravating or relieving factors.  No other c/o.  ROS as in subjective.   Objective: Vitals:   12/17/22 1620  BP: 124/86  Pulse: 97  Temp: 97.6 F (36.4 C)  SpO2: 98%    General appearance: Alert, WD/WN, no distress                             Skin: warm, no rash                                            Eyes: conjunctiva normal, corneas clear, PERRLA                                          Nose: mask on             Mouth/throat: mask on                           Neck: supple, normal ROM                          Heart: RRR                         Lungs: CTA bilaterally, no wheezes, rales, or rhonchi   TTP to left posterior ribs. Pain reproduced with movement.       Assessment: Persistent cough for 3 weeks or longer - Plan: azithromycin (ZITHROMAX) 250 MG tablet, DG Chest 2 View  Cough productive of yellow sputum - Plan: azithromycin (ZITHROMAX) 250 MG tablet, DG Chest 2 View  Rib pain on left side - Plan: DG Chest 2 View, DG Ribs Unilateral Left   Plan: Azithromycin prescribed for productive cough.  Chest x-ray ordered.  X-ray ordered of left ribs due to pain and tenderness to palpation.  No known injury.  Suspect musculoskeletal etiology.  Discussed symptomatic treatment. Follow-up pending x-ray results.

## 2022-12-18 ENCOUNTER — Encounter: Payer: Self-pay | Admitting: Family Medicine

## 2022-12-25 ENCOUNTER — Ambulatory Visit: Payer: BC Managed Care – PPO | Admitting: Internal Medicine

## 2022-12-25 VITALS — BP 122/76 | HR 99 | Temp 97.4°F | Ht 63.0 in | Wt 175.0 lb

## 2022-12-25 DIAGNOSIS — C73 Malignant neoplasm of thyroid gland: Secondary | ICD-10-CM

## 2022-12-25 DIAGNOSIS — E538 Deficiency of other specified B group vitamins: Secondary | ICD-10-CM

## 2022-12-25 DIAGNOSIS — E7800 Pure hypercholesterolemia, unspecified: Secondary | ICD-10-CM | POA: Diagnosis not present

## 2022-12-25 DIAGNOSIS — R739 Hyperglycemia, unspecified: Secondary | ICD-10-CM | POA: Diagnosis not present

## 2022-12-25 DIAGNOSIS — E611 Iron deficiency: Secondary | ICD-10-CM

## 2022-12-25 DIAGNOSIS — F17200 Nicotine dependence, unspecified, uncomplicated: Secondary | ICD-10-CM | POA: Diagnosis not present

## 2022-12-25 DIAGNOSIS — Z0001 Encounter for general adult medical examination with abnormal findings: Secondary | ICD-10-CM

## 2022-12-25 DIAGNOSIS — E559 Vitamin D deficiency, unspecified: Secondary | ICD-10-CM

## 2022-12-25 DIAGNOSIS — I10 Essential (primary) hypertension: Secondary | ICD-10-CM

## 2022-12-25 LAB — IBC PANEL
Iron: 67 ug/dL (ref 42–145)
Saturation Ratios: 19.3 % — ABNORMAL LOW (ref 20.0–50.0)
TIBC: 347.2 ug/dL (ref 250.0–450.0)
Transferrin: 248 mg/dL (ref 212.0–360.0)

## 2022-12-25 LAB — HEPATIC FUNCTION PANEL
ALT: 20 U/L (ref 0–35)
AST: 20 U/L (ref 0–37)
Albumin: 4.1 g/dL (ref 3.5–5.2)
Alkaline Phosphatase: 83 U/L (ref 39–117)
Bilirubin, Direct: 0 mg/dL (ref 0.0–0.3)
Total Bilirubin: 0.2 mg/dL (ref 0.2–1.2)
Total Protein: 7.4 g/dL (ref 6.0–8.3)

## 2022-12-25 LAB — URINALYSIS, ROUTINE W REFLEX MICROSCOPIC
Bilirubin Urine: NEGATIVE
Hgb urine dipstick: NEGATIVE
Ketones, ur: NEGATIVE
Nitrite: NEGATIVE
Specific Gravity, Urine: 1.015 (ref 1.000–1.030)
Total Protein, Urine: NEGATIVE
Urine Glucose: NEGATIVE
Urobilinogen, UA: 0.2 (ref 0.0–1.0)
pH: 6 (ref 5.0–8.0)

## 2022-12-25 LAB — BASIC METABOLIC PANEL
BUN: 14 mg/dL (ref 6–23)
CO2: 28 mEq/L (ref 19–32)
Calcium: 9.3 mg/dL (ref 8.4–10.5)
Chloride: 102 mEq/L (ref 96–112)
Creatinine, Ser: 0.64 mg/dL (ref 0.40–1.20)
GFR: 99.6 mL/min (ref >60.00)
Glucose, Bld: 72 mg/dL (ref 70–99)
Potassium: 3.6 mEq/L (ref 3.5–5.1)
Sodium: 140 mEq/L (ref 135–145)

## 2022-12-25 LAB — HEMOGLOBIN A1C: Hgb A1c MFr Bld: 6.1 % (ref 4.6–6.5)

## 2022-12-25 LAB — CBC WITH DIFFERENTIAL/PLATELET
Basophils Absolute: 0.1 10*3/uL (ref 0.0–0.1)
Basophils Relative: 0.8 % (ref 0.0–3.0)
Eosinophils Absolute: 0.1 10*3/uL (ref 0.0–0.7)
Eosinophils Relative: 1.2 % (ref 0.0–5.0)
HCT: 41.4 % (ref 36.0–46.0)
Hemoglobin: 14.1 g/dL (ref 12.0–15.0)
Lymphocytes Relative: 37.5 % (ref 12.0–46.0)
Lymphs Abs: 3.2 10*3/uL (ref 0.7–4.0)
MCHC: 34 g/dL (ref 30.0–36.0)
MCV: 92.7 fl (ref 78.0–100.0)
Monocytes Absolute: 0.8 10*3/uL (ref 0.1–1.0)
Monocytes Relative: 9.8 % (ref 3.0–12.0)
Neutro Abs: 4.4 10*3/uL (ref 1.4–7.7)
Neutrophils Relative %: 50.7 % (ref 43.0–77.0)
Platelets: 427 10*3/uL — ABNORMAL HIGH (ref 150.0–400.0)
RBC: 4.47 Mil/uL (ref 3.87–5.11)
RDW: 12.6 % (ref 11.5–15.5)
WBC: 8.6 10*3/uL (ref 4.0–10.5)

## 2022-12-25 LAB — LIPID PANEL
Cholesterol: 156 mg/dL (ref 0–200)
HDL: 55.3 mg/dL (ref >39.00)
LDL Cholesterol: 80 mg/dL (ref 0–99)
NonHDL: 101.16
Total CHOL/HDL Ratio: 3
Triglycerides: 104 mg/dL (ref 0.0–149.0)
VLDL: 20.8 mg/dL (ref 0.0–40.0)

## 2022-12-25 LAB — MICROALBUMIN / CREATININE URINE RATIO
Creatinine,U: 65.8 mg/dL
Microalb Creat Ratio: 1.1 mg/g (ref 0.0–30.0)
Microalb, Ur: <0.7 mg/dL (ref 0.0–1.9)

## 2022-12-25 LAB — VITAMIN D 25 HYDROXY (VIT D DEFICIENCY, FRACTURES): VITD: 37.49 ng/mL (ref 30.00–100.00)

## 2022-12-25 LAB — FERRITIN: Ferritin: 95.1 ng/mL (ref 10.0–291.0)

## 2022-12-25 LAB — VITAMIN B12: Vitamin B-12: >1500 pg/mL — ABNORMAL HIGH (ref 211–911)

## 2022-12-25 LAB — TSH: TSH: 1.22 u[IU]/mL (ref 0.35–5.50)

## 2022-12-25 NOTE — Progress Notes (Unsigned)
Patient ID: [name], female   DOB: 04-May-1966, 57 y.o.   MRN: [id number]         Chief Complaint:: wellness exam and Thyroid check and exposure to tuberculosis  , hx of thyroid ca, htn, hld, hyperglycemia, smoker       HPI:  [name] is a 57 y.o. female here for wellness exam; still smoking, not ready to quit; for shingrix at pharmacy, o/w up to date; due for pulm referral for LDCT lung cancer screening                        Also found out a co worker has remote hx of TB now s/p antibx tx, currently asympt but pt concerend about her level of risk.  Denies hyper or hypo thyroid symptoms such as voice, skin or hair change.  Pt denies chest pain, increased sob or doe, wheezing, orthopnea, PND, increased LE swelling, palpitations, dizziness or syncope.   Pt denies polydipsia, polyuria, or new focal neuro s/s.    Pt denies fever, wt loss, night sweats, loss of appetite, or other constitutional symptoms  Needs new endo referral as Dr Loanne Drilling has retired.     Wt Readings from Last 3 Encounters:  12/25/22 175 lb (79.4 kg)  12/17/22 178 lb (80.7 kg)  08/27/22 172 lb (78 kg)   BP Readings from Last 3 Encounters:  12/25/22 122/76  12/17/22 124/86  08/27/22 124/80   Immunization History  Administered Date(s) Administered   Influenza,inj,Quad PF,6+ Mos 08/13/2015, 09/15/2017, 08/23/2018, 08/24/2019, 10/24/2020, 08/27/2022   Influenza-Unspecified 08/17/2011, 09/14/2012, 09/03/2014   PFIZER Comirnaty(Gray Top)Covid-19 Tri-Sucrose Vaccine 11/27/2020, 12/18/2020   Pneumococcal Polysaccharide-23 01/02/2015   Td 03/28/2010   Tdap 09/28/2013, 08/27/2022  There are no preventive care reminders to display for this patient.    Past Medical History:  Diagnosis Date   ALLERGIC RHINITIS    Anemia    ANXIETY    DEPRESSION    Encounter for chronic pain management 10/25/2020   Fibroid    HYPERLIPIDEMIA    diet controlled, no meds   Hypertension    Hypothyroidism    Lichen sclerosus  of female genitalia 2018   Thyroid carcinoma [facility])    Past Surgical History:  Procedure Laterality Date   ABDOMINAL HYSTERECTOMY N/A 01/01/2015   Procedure:  SUPRACERVICAL HYSTERECTOMY WITH BILATERAL SALPINGECTOMT  Surgeon: Jamey Reas de Berton Lan, MD;  Location: [facility];  Service: Gynecology;  Laterality: N/A;   BILATERAL SALPINGECTOMY Bilateral 01/01/2015   Procedure: BILATERAL SALPINGECTOMY        ;  Surgeon: Everardo All Amundson de Berton Lan, MD;  Location: [facility];  Service: Gynecology;  Laterality: Bilateral;   CESAREAN SECTION  2002   x 1   TOTAL THYROIDECTOMY  2009/2010   TUBAL LIGATION     WISDOM TOOTH EXTRACTION      reports that she has been smoking cigarettes. She has a 15.50 pack-year smoking history. She has never used smokeless tobacco. She reports current alcohol use of about 6.0 standard drinks of alcohol per week. She reports that she does not use drugs. family history includes Breast cancer in her paternal grandmother; Breast cancer (age of onset: 33) in her mother; COPD in her father; Cancer in her maternal grandmother; Colon polyps in her father; Diabetes in her father; Heart failure in her father; Hypertension in an other family member; Kidney disease in her father; Stroke in an other family member. Allergies  Allergen Reactions   Doxycycline Nausea Only   Sulfonamide Derivatives Itching   Current Outpatient Medications on File Prior to Visit  Medication Sig Dispense Refill   amLODipine (NORVASC) 5 MG tablet Take 1 tablet (5 mg total) by mouth daily. 90 tablet 3   Ascorbic Acid (VITAMIN C) 1000 MG tablet Take 1,000 mg by mouth daily.     betamethasone valerate ointment (VALISONE) 0.1 % Apply to vulva twice daily for 2 weeks prn. May use twice weekly for maintenance dose. 45 g 0   BIOTIN PO Take 1 tablet by mouth daily.      cetirizine (ZYRTEC) 10 MG tablet Take 1 tablet (10 mg total) by mouth daily. 30 tablet 11   Cholecalciferol (VITAMIN D-3 PO) Take 1  capsule by mouth daily.     cyanocobalamin 1000 MCG tablet Take 1,000 mcg by mouth daily.     fluticasone (FLONASE) 50 MCG/ACT nasal spray Place 2 sprays into both nostrils daily. 16 g 6   levothyroxine (SYNTHROID) 112 MCG tablet Take 1 tablet by mouth daily. 90 tablet 2   losartan (COZAAR) 100 MG tablet Take 1 tablet (100 mg total) by mouth daily. 90 tablet 3   Multiple Vitamin (MULTIVITAMIN) capsule Take 1 capsule by mouth daily.     Omega-3 Fatty Acids (FISH OIL PO) Take 1 capsule by mouth daily.      polyethylene glycol (MIRALAX) 17 g packet Take 34 g by mouth 2 (two) times daily. You may increase as needed 14 each 0   rosuvastatin (CRESTOR) 10 MG tablet Take 1 tablet by mouth daily. 90 tablet 2   No current facility-administered medications on file prior to visit.        ROS:  All others reviewed and negative.  Objective        PE:  BP 122/76 (BP Location: Left Arm, Patient Position: Sitting, Cuff Size: Large)   Pulse 99   Temp (!) 97.4 F (36.3 C) (Oral)   Ht 5' 3"$  (1.6 m)   Wt 175 lb (79.4 kg)   LMP 12/05/2014 (Approximate)   SpO2 95%   BMI 31.00 kg/m                 Constitutional: Pt appears in NAD               HENT: Head: NCAT.                Right Ear: External ear normal.                 Left Ear: External ear normal.                Eyes: . Pupils are equal, round, and reactive to light. Conjunctivae and EOM are normal               Nose: without d/c or deformity               Neck: Neck supple. Gross normal ROM               Cardiovascular: Normal rate and regular rhythm.                 Pulmonary/Chest: Effort normal and breath sounds without rales or wheezing.                Abd:  Soft, NT, ND, + BS, no organomegaly               Neurological: Pt is  alert. At baseline orientation, motor grossly intact               Skin: Skin is warm. No rashes, no other new lesions, LE edema - none               Psychiatric: Pt behavior is normal without agitation   Micro:  none  Cardiac tracings I have personally interpreted today:  none  Pertinent Radiological findings (summarize): none   Lab Results  Component Value Date   WBC 8.6 12/25/2022   HGB 14.1 12/25/2022   HCT 41.4 12/25/2022   PLT 427.0 (H) 12/25/2022   GLUCOSE 72 12/25/2022   CHOL 156 12/25/2022   TRIG 104.0 12/25/2022   HDL 55.30 12/25/2022   LDLDIRECT 131.0 03/10/2007   LDLCALC 80 12/25/2022   ALT 20 12/25/2022   AST 20 12/25/2022   NA 140 12/25/2022   K 3.6 12/25/2022   CL 102 12/25/2022   CREATININE 0.64 12/25/2022   BUN 14 12/25/2022   CO2 28 12/25/2022   TSH 1.22 12/25/2022   HGBA1C 6.1 12/25/2022   MICROALBUR <0.7 12/25/2022   Assessment/Plan:  [name] is a 57 y.o. White or Caucasian [1] female with  has a past medical history of ALLERGIC RHINITIS, Anemia, ANXIETY, DEPRESSION, Encounter for chronic pain management (10/25/2020), Fibroid, HYPERLIPIDEMIA, Hypertension, Hypothyroidism, Lichen sclerosus of female genitalia (2018), and Thyroid carcinoma ([place]).  Encounter for well adult exam with abnormal findings Age and sex appropriate education and counseling updated with regular exercise and diet Referrals for preventative services - none needed Immunizations addressed - for shingrix at pharmacy Smoking counseling  - pt counseled to quit, pt not ready Evidence for depression or other mood disorder - chronic anxiety stable Most recent labs reviewed. I have personally reviewed and have noted: 1) the patient's medical and social history 2) The patient's current medications and supplements 3) The patient's height, weight, and BMI have been recorded in the chart   Malignant neoplasm of thyroid gland Pt will need new endo referral due to recent endo retired, for TFTs with labs  HTN (hypertension) BP Readings from Last 3 Encounters:  12/25/22 122/76  12/17/22 124/86  08/27/22 124/80   Stable, pt to continue medical treatment norvasc 5 mg qd, losartan 100 mg  qd   Hyperglycemia Lab Results  Component Value Date   HGBA1C 6.1 12/25/2022   Stable, pt to continue current medical treatment  - diet, wt control  Hyperlipidemia Lab Results  Component Value Date   LDLCALC 80 12/25/2022   Stable, pt to continue current statin crestor 10 mg qd   Smoker Pt counsled to quit, pt not ready; now for pulm referral for LDCT  Vitamin D deficiency Last vitamin D Lab Results  Component Value Date   VD25OH 37.49 12/25/2022   Low, to start oral replacement  Followup: No follow-ups on file.  Cathlean Cower, MD 12/27/2022 6:20 AM [facility]

## 2022-12-25 NOTE — Patient Instructions (Signed)
Please have your Shingrix (shingles) shots done at your local pharmacy.  Please continue all other medications as before, and refills have been done if requested.  Please have the pharmacy call with any other refills you may need.  Please continue your efforts at being more active, low cholesterol diet, and weight control.  You are otherwise up to date with prevention measures today.  Please keep your appointments with your specialists as you may have planned  You will be contacted regarding the referral for: pulmonary for the yearly CT scan, and Dr Buddy Duty - Endo  Please go to the LAB at the blood drawing area for the tests to be done  You will be contacted by phone if any changes need to be made immediately.  Otherwise, you will receive a letter about your results with an explanation, but please check with MyChart first.  Please remember to sign up for MyChart if you have not done so, as this will be important to you in the future with finding out test results, communicating by private email, and scheduling acute appointments online when needed.  Please make an Appointment to return in 6 months, or sooner if needed

## 2022-12-27 ENCOUNTER — Encounter: Payer: Self-pay | Admitting: Internal Medicine

## 2022-12-27 NOTE — Assessment & Plan Note (Signed)
Lab Results  Component Value Date   LDLCALC 80 12/25/2022   Stable, pt to continue current statin crestor 10 mg qd

## 2022-12-27 NOTE — Assessment & Plan Note (Signed)
Last vitamin D Lab Results  Component Value Date   VD25OH 37.49 12/25/2022   Low, to start oral replacement

## 2022-12-27 NOTE — Assessment & Plan Note (Signed)
Lab Results  Component Value Date   HGBA1C 6.1 12/25/2022   Stable, pt to continue current medical treatment  - diet, wt control

## 2022-12-27 NOTE — Assessment & Plan Note (Signed)
BP Readings from Last 3 Encounters:  12/25/22 122/76  12/17/22 124/86  08/27/22 124/80   Stable, pt to continue medical treatment norvasc 5 mg qd, losartan 100 mg qd

## 2022-12-27 NOTE — Assessment & Plan Note (Signed)
Pt will need new endo referral due to recent endo retired, for TFTs with labs

## 2022-12-27 NOTE — Assessment & Plan Note (Addendum)
Pt counsled to quit, pt not ready; now for pulm referral for LDCT

## 2022-12-27 NOTE — Assessment & Plan Note (Signed)
Age and sex appropriate education and counseling updated with regular exercise and diet Referrals for preventative services - none needed Immunizations addressed - for shingrix at pharmacy Smoking counseling  - pt counseled to quit, pt not ready Evidence for depression or other mood disorder - chronic anxiety stable Most recent labs reviewed. I have personally reviewed and have noted: 1) the patient's medical and social history 2) The patient's current medications and supplements 3) The patient's height, weight, and BMI have been recorded in the chart

## 2022-12-28 LAB — THYROGLOBULIN LEVEL: Thyroglobulin: 0.1 ng/mL — ABNORMAL LOW

## 2022-12-28 LAB — THYROGLOBULIN ANTIBODY: Thyroglobulin Ab: <1 IU/mL (ref <=1)

## 2023-01-04 ENCOUNTER — Other Ambulatory Visit: Payer: Self-pay | Admitting: Internal Medicine

## 2023-01-04 NOTE — Telephone Encounter (Signed)
Please refill as per office routine med refill policy (all routine meds to be refilled for 3 mo or monthly (per pt preference) up to one year from last visit, then month to month grace period for 3 mo, then further med refills will have to be denied) ? ?

## 2023-01-06 ENCOUNTER — Encounter: Payer: Self-pay | Admitting: Internal Medicine

## 2023-01-06 NOTE — Telephone Encounter (Signed)
I dont have anything to offer as I cannot see on notes or encouters or procedures or labs to what she is referring, thanks

## 2023-02-15 ENCOUNTER — Other Ambulatory Visit: Payer: Self-pay | Admitting: Internal Medicine

## 2023-02-18 ENCOUNTER — Encounter: Payer: Self-pay | Admitting: Internal Medicine

## 2023-02-18 MED ORDER — AMLODIPINE BESYLATE 5 MG PO TABS: 5.0000 mg | ORAL_TABLET | Freq: Every day | ORAL | 2 refills | Status: DC

## 2023-02-18 MED ORDER — LOSARTAN POTASSIUM 100 MG PO TABS: 100.0000 mg | ORAL_TABLET | Freq: Every day | ORAL | 2 refills | Status: DC

## 2023-02-18 MED ORDER — ROSUVASTATIN CALCIUM 10 MG PO TABS: ORAL_TABLET | ORAL | 2 refills | Status: DC

## 2023-02-18 MED ORDER — LEVOTHYROXINE SODIUM 112 MCG PO TABS: 112.0000 ug | ORAL_TABLET | Freq: Every day | ORAL | 2 refills | Status: DC

## 2023-03-11 ENCOUNTER — Encounter: Payer: Self-pay | Admitting: Internal Medicine

## 2023-03-11 ENCOUNTER — Ambulatory Visit: Payer: BC Managed Care – PPO | Admitting: Internal Medicine

## 2023-03-11 VITALS — BP 124/78 | HR 94 | Ht 63.0 in | Wt 176.4 lb

## 2023-03-11 DIAGNOSIS — C73 Malignant neoplasm of thyroid gland: Secondary | ICD-10-CM

## 2023-03-11 DIAGNOSIS — E890 Postprocedural hypothyroidism: Secondary | ICD-10-CM

## 2023-03-11 NOTE — Patient Instructions (Signed)
Please continue  Levothyroxine 112 mcg daily.  Take the thyroid hormone every day, with water, at least 30 minutes before breakfast, separated by at least 4 hours from: - acid reflux medications - calcium - iron - multivitamins  We will check a neck ultrasound.   You should have an endocrinology follow-up appointment in 6 months.

## 2023-03-11 NOTE — Progress Notes (Signed)
Patient ID: [name], female   DOB: [birth year], 57 y.o.   MRN: [id number]  HPI  [name] is a 57 y.o.-year-old female, returning for follow-up for metastatic papillary thyroid cancer and postsurgical hypothyroidism.  She previously saw Dr. Everardo All, last visit with him 1 year ago.  Pt. was dx with thyroid cancer in 2012.   Reviewed and addended history per review of the chart and Dr. George Hugh last note: 10/12: Total thyroidectomy (Dr. Ezzard Standing): 1.7 cm left lobe PTC, with 2 pos nodes (T1b N1 M0):  10/12: RAI rx 158 mci 09/25/2011: post-therapy WBS: pos at right neck, nodes vs remnant.   1.  Focal uptake within the thyroid bed just right of midline.  Differential includes residual thyroid tissue versus lymph node activity.  2. No evidence of distant metastasis.   Reviewed thyroglobulin and Tg antibodies: Lab Results  Component Value Date   THYROGLB 0.1 (L) 12/25/2022   THYROGLB 0.3 (L) 03/09/2022   THYROGLB 0.1 (L) 01/10/2021   THYROGLB 0.2 (L) 12/14/2019   THYROGLB 0.1 (L) 01/24/2019   THYROGLB 0.2 (L) 02/01/2017   THYROGLB 0.2 (L) 12/11/2015   THYROGLB 0.1 (L) 02/06/2015   THYROGLB <0.2 02/01/2014   THYROGLB <0.2 01/03/2013   THYROGLB 0.7 05/10/2012   THYROGLB 2.3 01/05/2012   Lab Results  Component Value Date   THGAB <1 12/25/2022   THGAB <1 03/09/2022   THGAB <1 01/10/2021   THGAB <1 12/14/2019   THGAB <1 01/24/2019   THGAB <1 02/01/2017   THGAB <1 12/11/2015   THGAB <1 02/06/2015   THGAB <20.0 02/01/2014   THGAB <20.0 01/03/2013   THGAB <20.0 01/05/2012   Pt denies: - feeling nodules in neck - hoarseness - dysphagia - choking  Postsurgical hypothyroidism:  Pt is on levothyroxine 112 mcg daily, taken: - in am, with coffee - fasting - at least 30 min from b'fast - no calcium - no iron - stopped multivitamins - no PPIs - not on Biotin - on vitamin D, vitamin C and fish oil; hair vitamins - with Biotin ? amount  I reviewed pt's thyroid  tests: Lab Results  Component Value Date   TSH 1.22 12/25/2022   TSH 1.46 02/23/2022   TSH 2.70 06/11/2021   TSH 0.95 01/10/2021   TSH 0.09 (L) 12/14/2019   TSH 0.08 (L) 09/25/2019   TSH 0.36 09/20/2018   TSH 0.41 11/01/2017   TSH 0.07 (L) 09/15/2017   TSH 0.82 02/01/2017   FREET4 1.14 01/10/2021   FREET4 1.40 12/14/2019   FREET4 1.14 09/20/2018   FREET4 1.17 11/01/2017   FREET4 1.14 08/13/2015   FREET4 1.56 05/04/2013    Pt denies: - fatigue - weight gain - cold intolerance - constipation  She has: - hair loss - dry skin  She has + FH of thyroid disorders -  thyroid cancer in sister, father. No h/o radiation tx to head or neck except for RAI treatment.  ROS: + see HPI  Past Medical History:  Diagnosis Date   ALLERGIC RHINITIS    Anemia    ANXIETY    DEPRESSION    Encounter for chronic pain management 10/25/2020   Fibroid    HYPERLIPIDEMIA    diet controlled, no meds   Hypertension    Hypothyroidism    Lichen sclerosus of female genitalia 2018   Thyroid carcinoma [facility])    Past Surgical History:  Procedure Laterality Date   ABDOMINAL HYSTERECTOMY N/A 01/01/2015   Procedure:  SUPRACERVICAL HYSTERECTOMY WITH BILATERAL  SALPINGECTOMT  Surgeon: Jacqualin Combes de Gwenevere Ghazi, MD;  Location: [facility];  Service: Gynecology;  Laterality: N/A;   BILATERAL SALPINGECTOMY Bilateral 01/01/2015   Procedure: BILATERAL SALPINGECTOMY        ;  Surgeon: Forrestine Him Amundson de Gwenevere Ghazi, MD;  Location: [facility];  Service: Gynecology;  Laterality: Bilateral;   CESAREAN SECTION  2002   x 1   TOTAL THYROIDECTOMY  2009/2010   TUBAL LIGATION     WISDOM TOOTH EXTRACTION     Social History   Socioeconomic History   Marital status: Married    Spouse name: Not on file   Number of children: 1   Years of education: Not on file   Highest education level: Not on file  Occupational History   Occupation: customer service  Tobacco Use   Smoking status: Every Day     Packs/day: 0.50    Years: 31.00    Additional pack years: 0.00    Total pack years: 15.50    Types: Cigarettes   Smokeless tobacco: Never  Vaping Use   Vaping Use: Former  Substance and Sexual Activity   Alcohol use: Yes    Alcohol/week: 6.0 standard drinks of alcohol    Types: 6 Cans of beer per week    Comment: 6 beers per week   Drug use: No   Sexual activity: Not Currently    Partners: Male    Birth control/protection: Surgical    Comment: tubal/TAH  Other Topics Concern   Not on file  Social History Narrative   Not on file   Social Determinants of Health   Financial Resource Strain: Not on file  Food Insecurity: Not on file  Transportation Needs: Not on file  Physical Activity: Not on file  Stress: Not on file  Social Connections: Not on file  Intimate Partner Violence: Not on file   Current Outpatient Medications on File Prior to Visit  Medication Sig Dispense Refill   amLODipine (NORVASC) 5 MG tablet Take 1 tablet (5 mg total) by mouth daily. 90 tablet 2   Ascorbic Acid (VITAMIN C) 1000 MG tablet Take 1,000 mg by mouth daily.     betamethasone valerate ointment (VALISONE) 0.1 % Apply to vulva twice daily for 2 weeks prn. May use twice weekly for maintenance dose. 45 g 0   BIOTIN PO Take 1 tablet by mouth daily.      cetirizine (ZYRTEC) 10 MG tablet Take 1 tablet (10 mg total) by mouth daily. 30 tablet 11   Cholecalciferol (VITAMIN D-3 PO) Take 1 capsule by mouth daily.     cyanocobalamin 1000 MCG tablet Take 1,000 mcg by mouth daily.     fluticasone (FLONASE) 50 MCG/ACT nasal spray Place 2 sprays into both nostrils daily. 16 g 6   levothyroxine (SYNTHROID) 112 MCG tablet Take 1 tablet (112 mcg total) by mouth daily. 90 tablet 2   losartan (COZAAR) 100 MG tablet Take 1 tablet (100 mg total) by mouth daily. 90 tablet 2   Multiple Vitamin (MULTIVITAMIN) capsule Take 1 capsule by mouth daily.     Omega-3 Fatty Acids (FISH OIL PO) Take 1 capsule by mouth daily.       polyethylene glycol (MIRALAX) 17 g packet Take 34 g by mouth 2 (two) times daily. You may increase as needed 14 each 0   rosuvastatin (CRESTOR) 10 MG tablet Take 1 tablet by mouth daily. 90 tablet 2   No current facility-administered medications on file prior  to visit.   Allergies  Allergen Reactions   Doxycycline Nausea Only   Sulfonamide Derivatives Itching   Family History  Problem Relation Age of Onset   Breast cancer Mother 45   Colon polyps Father    Diabetes Father    COPD Father    Heart failure Father    Kidney disease Father    Cancer Maternal Grandmother    Breast cancer Paternal Grandmother    Hypertension Other    Stroke Other    Colon cancer Neg Hx    Esophageal cancer Neg Hx    Stomach cancer Neg Hx    Rectal cancer Neg Hx    PE: BP 124/78 (BP Location: Left Arm, Patient Position: Sitting, Cuff Size: Normal)   Pulse 94   Ht 5\' 3"  (1.6 m)   Wt 176 lb 6.4 oz (80 kg)   LMP 12/05/2014 (Approximate)   SpO2 99%   BMI 31.25 kg/m  Wt Readings from Last 3 Encounters:  03/11/23 176 lb 6.4 oz (80 kg)  12/25/22 175 lb (79.4 kg)  12/17/22 178 lb (80.7 kg)   Constitutional: overweight, in NAD Eyes:  EOMI, no exophthalmos ENT: no neck masses, thyroidectomy scar healed, some scar tissue palpated in the thyroidectomy bed, no cervical lymphadenopathy Cardiovascular: tachycardia, RR, No MRG Respiratory: CTA B Musculoskeletal: no deformities Skin:no rashes Neurological: no tremor with outstretched hands  ASSESSMENT: 1. Thyroid cancer - see HPI  2. Postsurgical Hypothyroidism  PLAN:  1. Thyroid cancer - papillary - I had a long discussion with the patient about her recent diagnosis of thyroid cancer. We reviewed together the pathology >> she is clinical stage 1 TNM (due to age) - I reassured her that papillary thyroid cancer is a slow growing cancer with good prognosis; her life expectancy or quality of life is unlikely to be reduced due to the cancer.  - since  the tumor was >1.5 cm and it was metastatic to the lymph nodes (she was not aware of this), radioactive iodine was indicated for post-op thyroid remnant ablation. I explained that the main role of this is to facilitate monitoring in the long run (by checking thyroglobulin).  However, it can also ablate possible metastasis. - reviewed her thyroglobulin levels and they have been detectable, but low, and without an upward trend.  Latest level was obtained by PCP 2 months ago and it was 0.1, barely detectable, improved.  Her ATA antibodies remain undetectable. - We discussed that these are fair results, however, since she had lymph node metastases and did not have any imaging in 12 years, it is definitely time to check this.  We discussed about further plan, if any ultrasound will identify any abnormal neck masses: Biopsy, and possibly surgery. - I will then see the patient in approximately 6 months  2.  Patient with h/o total thyroidectomy for cancer, now with iatrogenic hypothyroidism, on levothyroxine therapy.  - latest thyroid labs reviewed with pt. >> normal: Lab Results  Component Value Date   TSH 1.22 12/25/2022  - she continues on LT4 112 mcg daily - pt feels good on this dose, without complaints other than hair loss - we discussed about taking the thyroid hormone every day, with water, >30 minutes before breakfast, separated by >4 hours from acid reflux medications, calcium, iron, multivitamins. Pt. is taking it correctly.   - She is on biotin and we discussed about staying off biotin for at least 1 week before thyroid labs - will check thyroid tests at  next visit  - Total time spent for the visit: 40 min, in precharting, postcharting, reviewing Dr. George Hugh last note, obtaining medical information from the chart and from the pt, reviewing her  previous labs, surgical reports, evaluations, and treatments, reviewing her symptoms, counseling her about her thyroid cancer and hypothyroidism (please  see the discussed topics above), and developing a plan to further investigate and treat them.  Component     Latest Ref Rng 12/25/2022  Thyroglobulin     ng/mL 0.1 (L)   Comment --   Thyroglobulin Ab     < or = 1 IU/mL <1   Thyroglobulin is barely detectable, at 0.1, thyroglobulin antibodies are undetectable.  Ultrasound is pending.  Orders Placed This Encounter  Procedures   US THYROID   Neck U/S (04/07/2023):  Surgical changes of total thyroidectomy without evidence of residual or recurrent thyroid tissue, nodularity or lymphadenopathy.  Carlus Pavlov, MD PhD [facility] Endocrinology

## 2023-04-01 DIAGNOSIS — J209 Acute bronchitis, unspecified: Secondary | ICD-10-CM | POA: Diagnosis not present

## 2023-04-01 DIAGNOSIS — R509 Fever, unspecified: Secondary | ICD-10-CM | POA: Diagnosis not present

## 2023-04-01 DIAGNOSIS — R0981 Nasal congestion: Secondary | ICD-10-CM | POA: Diagnosis not present

## 2023-04-01 DIAGNOSIS — R051 Acute cough: Secondary | ICD-10-CM | POA: Diagnosis not present

## 2023-04-07 ENCOUNTER — Ambulatory Visit
Admission: RE | Admit: 2023-04-07 | Discharge: 2023-04-07 | Disposition: A | Discharge status: Home / Self Care | Payer: BC Managed Care – PPO | Source: Ambulatory Visit | Attending: Internal Medicine | Admitting: Internal Medicine

## 2023-04-07 DIAGNOSIS — Z85850 Personal history of malignant neoplasm of thyroid: Secondary | ICD-10-CM | POA: Diagnosis not present

## 2023-04-07 DIAGNOSIS — C73 Malignant neoplasm of thyroid gland: Secondary | ICD-10-CM

## 2023-04-08 ENCOUNTER — Encounter: Payer: Self-pay | Admitting: Internal Medicine

## 2023-04-08 NOTE — Telephone Encounter (Signed)
Pt is not on a diuretic in her med list  Ok to continue current meds as is, but if possible, ask her to check BP twice per day at home until seen; call for any BP < 110/60  Also if she is able, have her check BP at home after lying down for 5 min, then just after standing up, and let us know the results  There is also a type of stroke in the back of the brain (cerebellum) that can cause dizziness - if she has a persistent pretty much constant dizziness, she should go to ED

## 2023-04-08 NOTE — Telephone Encounter (Signed)
Called and let Pt know she will be calling the office back next week to schedule an appt.

## 2023-04-29 DIAGNOSIS — R350 Frequency of micturition: Secondary | ICD-10-CM | POA: Diagnosis not present

## 2023-04-29 DIAGNOSIS — R3915 Urgency of urination: Secondary | ICD-10-CM | POA: Diagnosis not present

## 2023-04-29 DIAGNOSIS — N3091 Cystitis, unspecified with hematuria: Secondary | ICD-10-CM | POA: Diagnosis not present

## 2023-05-06 ENCOUNTER — Encounter: Payer: BC Managed Care – PPO | Admitting: Physician Assistant

## 2023-05-06 ENCOUNTER — Encounter: Payer: Self-pay | Admitting: Internal Medicine

## 2023-05-06 DIAGNOSIS — R3989 Other symptoms and signs involving the genitourinary system: Secondary | ICD-10-CM

## 2023-05-06 MED ORDER — NITROFURANTOIN MONOHYD MACRO 100 MG PO CAPS
100.0000 mg | ORAL_CAPSULE | Freq: Two times a day (BID) | ORAL | 0 refills | Status: DC
Start: 2023-05-06 — End: 2023-08-25

## 2023-05-06 NOTE — Progress Notes (Signed)
Error, PCP sent in medication already  This encounter was created in error - please disregard.

## 2023-06-04 ENCOUNTER — Encounter: Payer: Self-pay | Admitting: Internal Medicine

## 2023-06-04 DIAGNOSIS — R1013 Epigastric pain: Secondary | ICD-10-CM

## 2023-06-28 DIAGNOSIS — N130 Hydronephrosis with ureteropelvic junction obstruction: Secondary | ICD-10-CM | POA: Diagnosis not present

## 2023-06-28 DIAGNOSIS — R109 Unspecified abdominal pain: Secondary | ICD-10-CM | POA: Diagnosis not present

## 2023-06-28 DIAGNOSIS — R102 Pelvic and perineal pain: Secondary | ICD-10-CM | POA: Diagnosis not present

## 2023-06-28 DIAGNOSIS — R3915 Urgency of urination: Secondary | ICD-10-CM | POA: Diagnosis not present

## 2023-06-28 DIAGNOSIS — Q6231 Congenital ureterocele, orthotopic: Secondary | ICD-10-CM | POA: Diagnosis not present

## 2023-07-09 ENCOUNTER — Encounter: Payer: Self-pay | Admitting: Internal Medicine

## 2023-07-09 MED ORDER — REPATHA SURECLICK 140 MG/ML ~~LOC~~ SOAJ: 140.0000 mg | SUBCUTANEOUS | 3 refills | Status: DC

## 2023-07-13 ENCOUNTER — Other Ambulatory Visit: Payer: Self-pay | Admitting: Internal Medicine

## 2023-07-13 MED ORDER — ALPRAZOLAM 0.5 MG PO TABS: 0.5000 mg | ORAL_TABLET | Freq: Two times a day (BID) | ORAL | 2 refills | Status: DC | PRN

## 2023-07-13 NOTE — Addendum Note (Signed)
Addended by: Corwin Levins on: 07/13/2023 06:55 PM   Modules accepted: Orders

## 2023-07-14 ENCOUNTER — Other Ambulatory Visit (HOSPITAL_COMMUNITY): Payer: Self-pay

## 2023-07-14 ENCOUNTER — Telehealth: Payer: Self-pay

## 2023-07-14 DIAGNOSIS — Z90710 Acquired absence of both cervix and uterus: Secondary | ICD-10-CM | POA: Diagnosis not present

## 2023-07-14 DIAGNOSIS — N130 Hydronephrosis with ureteropelvic junction obstruction: Secondary | ICD-10-CM | POA: Diagnosis not present

## 2023-07-14 DIAGNOSIS — I10 Essential (primary) hypertension: Secondary | ICD-10-CM

## 2023-07-14 DIAGNOSIS — E7800 Pure hypercholesterolemia, unspecified: Secondary | ICD-10-CM

## 2023-07-14 DIAGNOSIS — R739 Hyperglycemia, unspecified: Secondary | ICD-10-CM

## 2023-07-14 DIAGNOSIS — R9431 Abnormal electrocardiogram [ECG] [EKG]: Secondary | ICD-10-CM

## 2023-07-14 DIAGNOSIS — K769 Liver disease, unspecified: Secondary | ICD-10-CM | POA: Diagnosis not present

## 2023-07-14 NOTE — Telephone Encounter (Signed)
*  Primary  Pharmacy Patient Advocate Encounter   Received notification from CoverMyMeds that prior authorization for Repatha SureClick 140MG /ML auto-injectors  is required/requested.   Insurance verification completed.   The patient is insured through [facility] .   Per test claim: PA required; PA started via CoverMyMeds. KEY [id number] . Waiting for clinical questions to populate.

## 2023-07-14 NOTE — Progress Notes (Signed)
This encounter was created in error - please disregard.

## 2023-07-15 NOTE — Telephone Encounter (Signed)
 Clinical questions answered and PA submitted

## 2023-07-20 NOTE — Telephone Encounter (Signed)
Pharmacy Patient Advocate Encounter  Received notification from [facility] [place]  that Prior Authorization for Repatha SureClick 140MG /ML auto-injectors has been DENIED.  Full denial letter will be uploaded to the media tab. See denial reason below.   PA #/Case ID/Reference #: [id number]   DENIAL REASON:

## 2023-07-23 ENCOUNTER — Telehealth: Payer: Self-pay | Admitting: Internal Medicine

## 2023-07-23 NOTE — Telephone Encounter (Signed)
As this is an unusual request and MRI are I believe over $2000, we should try to get the documented results of the imaging to which the patient refers.    Please obtain this result     thanks

## 2023-07-23 NOTE — Telephone Encounter (Signed)
Patient had imaging done at her urology appointment and they said they saw a spot on her liver. They said she should contact her PCP to get a MRI. Patient would like a call back at [phone].

## 2023-07-26 ENCOUNTER — Encounter: Payer: Self-pay | Admitting: Internal Medicine

## 2023-07-26 DIAGNOSIS — K769 Liver disease, unspecified: Secondary | ICD-10-CM

## 2023-07-26 NOTE — Telephone Encounter (Signed)
Called and let Pt know to have her urology office fax over the results.

## 2023-07-26 NOTE — Telephone Encounter (Signed)
Please to note pt concern about needing an MRI  It appears we need to be proactive since the results apparently were not forwarded to [place], to contact the provider mentioned by the patient to get the documentation regarding what she is referring to     thanks

## 2023-07-26 NOTE — Telephone Encounter (Signed)
Already called Pt to get results faxed over.

## 2023-07-28 NOTE — Addendum Note (Signed)
Addended by: Corwin Levins on: 07/28/2023 03:46 PM   Modules accepted: Orders

## 2023-08-09 ENCOUNTER — Encounter: Payer: Self-pay | Admitting: Internal Medicine

## 2023-08-12 ENCOUNTER — Other Ambulatory Visit: Payer: BC Managed Care – PPO

## 2023-08-17 NOTE — Progress Notes (Signed)
57 y.o. G37P1001 Married Caucasian female here for annual exam.    Hx of liver and pancreatic cyst.  Will do a follow up MRI.  She is being followed by [facility].   She had a cystoscopy by Dr. Arita Miss for UTI symptoms.  She has not done follow up but will call to check on this.   Not using Valisone ointment.   Quit smoking several weeks ago.   Father is not well.  Son is a Copywriter, advertising.   PCP: Dr. Jonny Ruiz    Patient's last menstrual period was 12/05/2014 (approximate).           Sexually active: No.  The current method of family planning is status post hysterectomy.    Exercising: No.   Smoker:  former  Health Maintenance: Pap:  01/02/19 neg: HR HPV neg, 12/29/17 neg: HR HPV neg History of abnormal Pap:  yes, Pap 09/28/13 - ASCUS and positive HR HPV, Colpo 10/25/13 - ECC Benign.  Pap 10/01/14 - ASCUS, neg HR HPV. Pap 04/11/15 - ASCUS and neg HR HPV.  MMG:  12/10/22 Breast Density Cat B, BI-RADS CAT 1 neg Colonoscopy:  09/23/21 BMD:   n/a  Result  n/a TDaP:  08/27/22 Gardasil:   no HIV: 08/27/22 NR Hep C: 09/15/17 NR Screening Labs: PCP   reports that she quit smoking about 6 months ago. Her smoking use included cigarettes. She started smoking about 31 years ago. She has a 15.5 pack-year smoking history. She has never used smokeless tobacco. She reports current alcohol use of about 6.0 standard drinks of alcohol per week. She reports that she does not use drugs.  Past Medical History:  Diagnosis Date   ALLERGIC RHINITIS    Anemia    ANXIETY    DEPRESSION    Encounter for chronic pain management 10/25/2020   Fibroid    HYPERLIPIDEMIA    diet controlled, no meds   Hypertension    Hypothyroidism    Lichen sclerosus of female genitalia 2018   Thyroid carcinoma [facility])     Past Surgical History:  Procedure Laterality Date   ABDOMINAL HYSTERECTOMY N/A 01/01/2015   Procedure:  SUPRACERVICAL HYSTERECTOMY WITH BILATERAL SALPINGECTOMT  Surgeon: Jacqualin Combes de Gwenevere Ghazi, MD;   Location: [facility];  Service: Gynecology;  Laterality: N/A;   BILATERAL SALPINGECTOMY Bilateral 01/01/2015   Procedure: BILATERAL SALPINGECTOMY        ;  Surgeon: Forrestine Him Amundson de Gwenevere Ghazi, MD;  Location: [facility];  Service: Gynecology;  Laterality: Bilateral;   CESAREAN SECTION  2002   x 1   TOTAL THYROIDECTOMY  2009/2010   TUBAL LIGATION     WISDOM TOOTH EXTRACTION      Current Outpatient Medications  Medication Sig Dispense Refill   ALPRAZolam (XANAX) 0.5 MG tablet Take 1 tablet (0.5 mg total) by mouth 2 (two) times daily as needed for anxiety. 60 tablet 2   amLODipine (NORVASC) 5 MG tablet Take 1 tablet (5 mg total) by mouth daily. 90 tablet 2   Ascorbic Acid (VITAMIN C) 1000 MG tablet Take 1,000 mg by mouth daily.     betamethasone valerate ointment (VALISONE) 0.1 % Apply to vulva twice daily for 2 weeks prn. May use twice weekly for maintenance dose. 45 g 0   BIOTIN PO Take 1 tablet by mouth daily.      Cholecalciferol (VITAMIN D-3 PO) Take 1 capsule by mouth daily.     cyanocobalamin 1000 MCG tablet Take 1,000 mcg by  mouth daily.     Evolocumab (REPATHA SURECLICK) 140 MG/ML SOAJ Inject 140 mg into the skin every 14 (fourteen) days. 6 mL 3   fluticasone (FLONASE) 50 MCG/ACT nasal spray Place 2 sprays into both nostrils daily. 16 g 6   levothyroxine (SYNTHROID) 112 MCG tablet Take 1 tablet (112 mcg total) by mouth daily. 90 tablet 2   losartan (COZAAR) 100 MG tablet Take 1 tablet (100 mg total) by mouth daily. 90 tablet 2   Multiple Vitamin (MULTIVITAMIN) capsule Take 1 capsule by mouth daily.     Omega-3 Fatty Acids (FISH OIL PO) Take 1 capsule by mouth daily.      polyethylene glycol (MIRALAX) 17 g packet Take 34 g by mouth 2 (two) times daily. You may increase as needed 14 each 0   cetirizine (ZYRTEC) 10 MG tablet Take 1 tablet (10 mg total) by mouth daily. 30 tablet 11   No current facility-administered medications for this visit.    Family History  Problem Relation  Age of Onset   Breast cancer Mother 37   Colon polyps Father    Diabetes Father    COPD Father    Heart failure Father    Kidney disease Father    Cancer Maternal Grandmother    Breast cancer Paternal Grandmother    Hypertension Other    Stroke Other    Colon cancer Neg Hx    Esophageal cancer Neg Hx    Stomach cancer Neg Hx    Rectal cancer Neg Hx     Review of Systems  All other systems reviewed and are negative.   Exam:   BP 120/84 (BP Location: Left Arm, Patient Position: Sitting, Cuff Size: Normal)   Pulse (!) 104   Ht 5\' 4"  (1.626 m)   Wt 174 lb (78.9 kg)   LMP 12/05/2014 (Approximate)   SpO2 98%   BMI 29.87 kg/m     General appearance: alert, cooperative and appears stated age Head: normocephalic, without obvious abnormality, atraumatic Neck: no adenopathy, supple, symmetrical, trachea midline and thyroid normal to inspection and palpation Lungs: clear to auscultation bilaterally Breasts: normal appearance, no masses or tenderness, No nipple retraction or dimpling, No nipple discharge or bleeding, No axillary adenopathy Heart: regular rate and rhythm Abdomen: soft, non-tender; no masses, no organomegaly Extremities: extremities normal, atraumatic, no cyanosis or edema Skin: skin color, texture, turgor normal. No rashes or lesions Lymph nodes: cervical, supraclavicular, and axillary nodes normal. Neurologic: grossly normal  Pelvic: External genitalia:  labia minor and perineum with white skin change.              No abnormal inguinal nodes palpated.              Urethra:  normal appearing urethra with no masses, tenderness or lesions              Bartholins and Skenes: normal                 Vagina: normal appearing vagina with normal color and discharge, no lesions              Cervix: no lesions              Pap taken: yes Bimanual Exam:  Uterus:  absent              Adnexa: no mass, fullness, tenderness              Rectal exam: yes.  Confirms.  Anus:  normal sphincter tone, no lesions  Chaperone was present for exam:  Warren Lacy, CMA  Assessment:   Well woman visit with gynecologic exam. Hx bladder cyst versus septum seen on [place] in December 2022. Had supracervical hysterectomy 01/01/15. 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  Hx recurrent ASCUS paps.  Quit tobacco.  Lichen sclerosus.  Hx thyroid cancer.  FH breast cancer in mother.   Plan: Mammogram screening discussed. Self breast awareness reviewed. Pap and HR HPV collected. Guidelines for Calcium, Vitamin D, regular exercise program including cardiovascular and weight bearing exercise. Refill of Valisone.  Follow up annually and prn.

## 2023-08-19 ENCOUNTER — Ambulatory Visit
Admission: RE | Admit: 2023-08-19 | Discharge: 2023-08-19 | Disposition: A | Discharge status: Home / Self Care | Payer: BC Managed Care – PPO | Source: Ambulatory Visit | Attending: Internal Medicine | Admitting: Internal Medicine

## 2023-08-19 DIAGNOSIS — I700 Atherosclerosis of aorta: Secondary | ICD-10-CM | POA: Diagnosis not present

## 2023-08-19 DIAGNOSIS — K769 Liver disease, unspecified: Secondary | ICD-10-CM

## 2023-08-19 DIAGNOSIS — K862 Cyst of pancreas: Secondary | ICD-10-CM | POA: Diagnosis not present

## 2023-08-19 DIAGNOSIS — K7689 Other specified diseases of liver: Secondary | ICD-10-CM | POA: Diagnosis not present

## 2023-08-19 MED ORDER — GADOPICLENOL 0.5 MMOL/ML IV SOLN
8.0000 mL | Freq: Once | INTRAVENOUS | Status: AC | PRN
  Administered 2023-08-19: 8 mL via INTRAVENOUS

## 2023-08-21 ENCOUNTER — Other Ambulatory Visit: Payer: Self-pay | Admitting: Internal Medicine

## 2023-08-21 DIAGNOSIS — K869 Disease of pancreas, unspecified: Secondary | ICD-10-CM

## 2023-08-23 ENCOUNTER — Encounter: Payer: Self-pay | Admitting: Internal Medicine

## 2023-08-24 MED ORDER — REPATHA SURECLICK 140 MG/ML ~~LOC~~ SOAJ: 140.0000 mg | SUBCUTANEOUS | 3 refills | Status: DC

## 2023-08-25 ENCOUNTER — Encounter: Payer: Self-pay | Admitting: Nurse Practitioner

## 2023-08-25 ENCOUNTER — Ambulatory Visit: Payer: BC Managed Care – PPO | Admitting: Nurse Practitioner

## 2023-08-25 VITALS — BP 128/86 | HR 88 | Ht 63.0 in | Wt 172.6 lb

## 2023-08-25 DIAGNOSIS — K7689 Other specified diseases of liver: Secondary | ICD-10-CM | POA: Diagnosis not present

## 2023-08-25 DIAGNOSIS — K862 Cyst of pancreas: Secondary | ICD-10-CM | POA: Diagnosis not present

## 2023-08-25 NOTE — Progress Notes (Signed)
Agree with assessment and plan as outlined. Other close surveillance MRCP versus EUS.  If this cyst is considered to be complex, will see if Dr. Meridee Score would prefer to proceed with EUS if the patient is willing.

## 2023-08-25 NOTE — Patient Instructions (Signed)
Colleen,NP will contact you with Dr.Mansouraty's recommendations regarding further evaluation of your pancreatic cyst.  Reduce alcohol intake.  Avoid weight gain.  Due to recent changes in healthcare laws, you may see the results of your imaging and laboratory studies on MyChart before your provider has had a chance to review them.  We understand that in some cases there may be results that are confusing or concerning to you. Not all laboratory results come back in the same time frame and the provider may be waiting for multiple results in order to interpret others.  Please give [place] 48 hours in order for your provider to thoroughly review all the results before contacting the office for clarification of your results.   Thank you for trusting me with your gastrointestinal care!   Alcide Evener, CRNP

## 2023-08-25 NOTE — Progress Notes (Signed)
08/25/2023 [name] [id number] September 24, 1966   Chief Complaint: Liver cyst, pancreatic cyst   History of Present Illness: [name] is a 57 year old female with a past medical history of anxiety, depression, hypertension, hyperlipidemia, hypothyroidism, thyroid carcinoma and colon polyps.  She is known by Dr. Adela Lank.  She presents to our office today as referred by Dr. Oliver Barre for further evaluation regarding a pancreatic cyst identified per abdominal MRI.  She has a history of recurrent UTIs followed by urology, cystoscopy several months ago was unrevealing and during one of her urology follow-up appointments she mentioned having abdominal bloat.  An abdominal CTAP with and without contrast was done 07/14/2023 by [facility] which showed a 13 mm hypoenhancing lesion in segment 4 of the liver which was indeterminate. An abdominal MRI with and without contrast 08/19/2023 identified the liver lesion in segment 4 most likely be a benign mildly proteinaceous cyst and no further image follow-up was recommended and a mildly complex cystic lesion to the junction of the body and tail of the pancreas measuring 1.8 x 2.4 x 1.5 cm was identified. She presents to our office today for further evaluation regarding this pancreatic cyst and she stated she was specifically referred to see Dr. Meridee Score. She has infrequent nausea, once every blue moon without vomiting.  She has intermittent abdominal bloat, sometimes feels like she ate 8 meals. No abdominal pain. She is passing a normal formed brown bowel movement every other day. No floating or greasy stools.  She sometimes takes MiraLAX if she feels constipated.  No weight loss.  No prior history of pancreatitis.  No known family history of pancreatic disease.  Her most recent colonoscopy was 09/23/2021 which identified 2 hyperplastic polyps removed from the colon.  She drinks 14 to 20 glasses of wine or beer weekly.  She stopped smoking  cigarettes 6 weeks ago.      Latest Ref Rng & Units 12/25/2022    3:11 PM 02/23/2022    9:06 AM 06/11/2021    3:08 PM  CBC  WBC 4.0 - 10.5 K/uL 8.6  7.9  8.1   Hemoglobin 12.0 - 15.0 g/dL 44.0  10.2  72.5   Hematocrit 36.0 - 46.0 % 41.4  44.2  40.9   Platelets 150.0 - 400.0 K/uL 427.0  348.0  328.0        Latest Ref Rng & Units 12/25/2022    3:11 PM 02/23/2022    9:06 AM 06/11/2021    3:08 PM  CMP  Glucose 70 - 99 mg/dL 72  98  366   BUN 6 - 23 mg/dL 14  14  17    Creatinine 0.40 - 1.20 mg/dL 4.40  3.47  4.25   Sodium 135 - 145 mEq/L 140  139  136   Potassium 3.5 - 5.1 mEq/L 3.6  4.3  3.9   Chloride 96 - 112 mEq/L 102  101  101   CO2 19 - 32 mEq/L 28  27  27    Calcium 8.4 - 10.5 mg/dL 9.3  9.5  9.2   Total Protein 6.0 - 8.3 g/dL 7.4  7.6  7.2   Total Bilirubin 0.2 - 1.2 mg/dL 0.2  0.4  0.3   Alkaline Phos 39 - 117 U/L 83  73  83   AST 0 - 37 U/L 20  19  14    ALT 0 - 35 U/L 20  18  15      Abdominal MRI with  and without contrast 08/19/2023: FINDINGS: Lower chest: Unremarkable.   Hepatobiliary: The lesion of concern is located in segment 4A of the liver (axial image 33 of series 13) and is high signal intensity on pre gadolinium T1 and T2 weighted images. Post gadolinium subtraction images demonstrate no definite internal enhancement within the lesion, and there is no diffusion restriction, indicative of a benign lesion (presumably a mildly proteinaceous cyst). No other suspicious appearing hepatic lesions are noted. No intra or extrahepatic biliary ductal dilatation. Gallbladder is normal in appearance. Common bile duct measures 3 mm in the porta hepatis.   Pancreas: No solid pancreatic mass. Extending exophytically from the pancreas in the region of the junction of the body and tail (axial image 16 of series 10 and coronal image 18 of series 6) there is a T1 hypointense, T2 hyperintense, nonenhancing lesion which measures approximately 1.8 x 2.4 x 1.5 cm which does not  appear to communicate with the main pancreatic duct, but does appear to have some thin internal septations, and has some dependent T1 hyperintense and T2 hypointense material, likely internal debris. No pancreatic ductal dilatation. No peripancreatic fluid collections or inflammatory changes.   Spleen:  Unremarkable.   Adrenals/Urinary Tract: Bilateral kidneys and adrenal glands are normal in appearance. No hydroureteronephrosis.   Stomach/Bowel: Unremarkable.   Vascular/Lymphatic: Aortic atherosclerosis. No aneurysm identified in the visualized abdominal vasculature. No lymphadenopathy noted in the abdomen.   Other: No significant volume of ascites noted in the visualized portions of the peritoneal cavity.   Musculoskeletal: No aggressive appearing osseous lesions are noted in the visualized portions of the skeleton.   IMPRESSION: 1. The lesion of concern in segment 4A of the liver has benign imaging characteristics, presumably a mildly proteinaceous cyst. No imaging follow-up is recommended. 2. However, there is a mildly complex cystic lesion in the pancreas at the junction of body and tail measuring 1.8 x 2.4 x 1.5 cm. Although this has generally benign imaging characteristics, there is some mild complexity of the lesion. Further evaluation with endoscopic ultrasound should be considered to exclude small pancreatic malignancy. Alternatively, close attention on follow-up repeat abdominal MRI with and without IV gadolinium with MRCP in 3-6 months could be considered. 3. Aortic atherosclerosis.  Colonoscopy 09/23/2021: - The examined portion of the ileum was normal. - Two 3 to 4 mm polyps at the hepatic flexure, removed with a cold snare. Resected and retrieved.  -Internal hemorrhoids  - 7 year recall HYPERPLASTIC POLYPS. NEGATIVE FOR DYSPLASIA  Past Medical History:  Diagnosis Date   ALLERGIC RHINITIS    Anemia    ANXIETY    DEPRESSION    Encounter for chronic pain  management 10/25/2020   Fibroid    HYPERLIPIDEMIA    diet controlled, no meds   Hypertension    Hypothyroidism    Lichen sclerosus of female genitalia 2018   Thyroid carcinoma [facility])    Past Surgical History:  Procedure Laterality Date   ABDOMINAL HYSTERECTOMY N/A 01/01/2015   Procedure:  SUPRACERVICAL HYSTERECTOMY WITH BILATERAL SALPINGECTOMT  Surgeon: Jacqualin Combes de Gwenevere Ghazi, MD;  Location: [facility];  Service: Gynecology;  Laterality: N/A;   BILATERAL SALPINGECTOMY Bilateral 01/01/2015   Procedure: BILATERAL SALPINGECTOMY        ;  Surgeon: Forrestine Him Amundson de Gwenevere Ghazi, MD;  Location: [facility];  Service: Gynecology;  Laterality: Bilateral;   CESAREAN SECTION  2002   x 1   TOTAL THYROIDECTOMY  2009/2010   TUBAL LIGATION  WISDOM TOOTH EXTRACTION     Current Outpatient Medications on File Prior to Visit  Medication Sig Dispense Refill   ALPRAZolam (XANAX) 0.5 MG tablet Take 1 tablet (0.5 mg total) by mouth 2 (two) times daily as needed for anxiety. 60 tablet 2   amLODipine (NORVASC) 5 MG tablet Take 1 tablet (5 mg total) by mouth daily. 90 tablet 2   Ascorbic Acid (VITAMIN C) 1000 MG tablet Take 1,000 mg by mouth daily.     betamethasone valerate ointment (VALISONE) 0.1 % Apply to vulva twice daily for 2 weeks prn. May use twice weekly for maintenance dose. 45 g 0   BIOTIN PO Take 1 tablet by mouth daily.      Cholecalciferol (VITAMIN D-3 PO) Take 1 capsule by mouth daily.     cyanocobalamin 1000 MCG tablet Take 1,000 mcg by mouth daily.     fluticasone (FLONASE) 50 MCG/ACT nasal spray Place 2 sprays into both nostrils daily. 16 g 6   levothyroxine (SYNTHROID) 112 MCG tablet Take 1 tablet (112 mcg total) by mouth daily. 90 tablet 2   losartan (COZAAR) 100 MG tablet Take 1 tablet (100 mg total) by mouth daily. 90 tablet 2   Multiple Vitamin (MULTIVITAMIN) capsule Take 1 capsule by mouth daily.     Omega-3 Fatty Acids (FISH OIL PO) Take 1 capsule by mouth daily.       polyethylene glycol (MIRALAX) 17 g packet Take 34 g by mouth 2 (two) times daily. You may increase as needed 14 each 0   rosuvastatin (CRESTOR) 10 MG tablet Take 1 tablet by mouth daily. 90 tablet 2   cetirizine (ZYRTEC) 10 MG tablet Take 1 tablet (10 mg total) by mouth daily. 30 tablet 11   No current facility-administered medications on file prior to visit.   Allergies  Allergen Reactions   Doxycycline Nausea Only   Sulfonamide Derivatives Itching   Current Medications, Allergies, Past Medical History, Past Surgical History, Family History and Social History were reviewed in [employer] record.  Review of Systems:   Constitutional: Negative for fever, sweats, chills or weight loss.  Respiratory: Negative for shortness of breath.   Cardiovascular: Negative for chest pain, palpitations and leg swelling.  Gastrointestinal: See HPI.  Musculoskeletal: Negative for back pain or muscle aches.  Neurological: Negative for dizziness, headaches or paresthesias.   Physical Exam: BP 128/86   Pulse 88   Ht 5\' 3"  (1.6 m)   Wt 172 lb 9.6 oz (78.3 kg)   LMP 12/05/2014 (Approximate)   BMI 30.57 kg/m  General: 57 year old female in no acute distress. Head: Normocephalic and atraumatic. Eyes: No scleral icterus. Conjunctiva pink . Ears: Normal auditory acuity. Mouth: Dentition intact. No ulcers or lesions.  Lungs: Clear throughout to auscultation. Heart: Regular rate and rhythm, no murmur. Abdomen: Soft, nontender and nondistended. No masses or hepatomegaly. Normal bowel sounds x 4 quadrants.  Rectal: Deferred. Musculoskeletal: Symmetrical with no gross deformities. Extremities: No edema. Neurological: Alert oriented x 4. No focal deficits.  Psychological: Alert and cooperative. Normal mood and affect  Assessment and Recommendations:  57 year old female with a pancreatic cyst per MRI. CTAP with and without contrast 07/14/2023 per [facility] due patient to having  abdominal bloat which showed a 13 mm hypoenhancing lesion in segment 4 of the liver which was indeterminate. An abdominal MRI with and without contrast 08/19/2023 identified the liver lesion in segment 4 most likely be a benign mildly proteinaceous cyst and no further image  follow-up was recommended and a mildly complex cystic lesion to the junction of the body and tail of the pancreas measuring 1.8 x 2.4 x 1.5 cm was identified.  -I will consult with Dr. Meridee Score to review the patient's MRI and to determine interval of surveillance MRI versus EUS evaluation -Patient encouraged to reduce alcohol intake -Avoid weight gain  Personal history of colon polyps.  Colonoscopy 09/2021 identified 2 hyperplastic polyps removed from the colon. -Next colonoscopy due 09/2028 secondary to prior history of a tubular adenomatous polyp per colonoscopy in 2017

## 2023-08-26 ENCOUNTER — Other Ambulatory Visit: Payer: Self-pay

## 2023-08-26 ENCOUNTER — Other Ambulatory Visit: Payer: BC Managed Care – PPO

## 2023-08-26 DIAGNOSIS — K7689 Other specified diseases of liver: Secondary | ICD-10-CM

## 2023-08-26 DIAGNOSIS — K862 Cyst of pancreas: Secondary | ICD-10-CM

## 2023-08-26 NOTE — Progress Notes (Signed)
I called patient and discussed Dr. Elesa Hacker recommendations. Patient agreeable to having Ca19-9 lab done in our lab. I will contact Dr. Adela Lank, Dr. Meridee Score and patient with results when received.   Kaira, pls enter lab order for CA19-9. DX: Pancreatic cyst. THX.

## 2023-08-26 NOTE — Progress Notes (Signed)
Order in epic for CA 19-9. Pt notified she may come to the lab for blood draw now.

## 2023-08-26 NOTE — Progress Notes (Signed)
Dr. Meridee Score reviewed this case with [place] - he recommends CA 19-9 level and if normal repeat MRCP in 3-4 months. If the CA 19-9 is elevated can then proceed with EUS.  Jill Side can you help coordinate with your staff to get the CA 19-9 level done and let her know recommendations? Thanks

## 2023-08-27 LAB — CANCER ANTIGEN 19-9: CA 19-9: 8 U/mL (ref <34)

## 2023-08-27 MED ORDER — REPATHA SURECLICK 140 MG/ML ~~LOC~~ SOAJ: 140.0000 mg | SUBCUTANEOUS | 3 refills | Status: DC

## 2023-08-27 NOTE — Addendum Note (Signed)
Addended by: Corwin Levins on: 08/27/2023 03:05 PM   Modules accepted: Orders

## 2023-08-31 ENCOUNTER — Other Ambulatory Visit (HOSPITAL_COMMUNITY)
Admission: RE | Admit: 2023-08-31 | Discharge: 2023-08-31 | Disposition: A | Discharge status: Home / Self Care | Payer: BC Managed Care – PPO | Source: Ambulatory Visit | Attending: Obstetrics and Gynecology | Admitting: Obstetrics and Gynecology

## 2023-08-31 ENCOUNTER — Encounter: Payer: Self-pay | Admitting: Obstetrics and Gynecology

## 2023-08-31 ENCOUNTER — Ambulatory Visit (INDEPENDENT_AMBULATORY_CARE_PROVIDER_SITE_OTHER): Payer: BC Managed Care – PPO | Admitting: Obstetrics and Gynecology

## 2023-08-31 VITALS — BP 120/84 | HR 104 | Ht 64.0 in | Wt 174.0 lb

## 2023-08-31 DIAGNOSIS — Z124 Encounter for screening for malignant neoplasm of cervix: Secondary | ICD-10-CM

## 2023-08-31 DIAGNOSIS — Z01419 Encounter for gynecological examination (general) (routine) without abnormal findings: Secondary | ICD-10-CM

## 2023-08-31 MED ORDER — BETAMETHASONE VALERATE 0.1 % EX OINT: TOPICAL_OINTMENT | CUTANEOUS | 0 refills | Status: DC

## 2023-08-31 NOTE — Patient Instructions (Signed)

## 2023-09-02 LAB — CYTOLOGY - PAP
Comment: NEGATIVE
Diagnosis: NEGATIVE
High risk HPV: NEGATIVE

## 2023-09-02 NOTE — Telephone Encounter (Signed)
Ok noted  Please ask pt if she is willing to have a 5 min CT scan for the heart to check for blockages in the arteries (this would be $99 cash pay)  If not now, this can be discussed at her next visit, thanks

## 2023-09-03 NOTE — Addendum Note (Signed)
Addended by: Corwin Levins on: 09/03/2023 04:51 PM   Modules accepted: Orders

## 2023-09-03 NOTE — Telephone Encounter (Signed)
Ok  the order is done, thanks

## 2023-09-06 ENCOUNTER — Encounter: Payer: Self-pay | Admitting: Internal Medicine

## 2023-09-06 ENCOUNTER — Ambulatory Visit: Payer: BC Managed Care – PPO | Admitting: Internal Medicine

## 2023-09-06 VITALS — BP 120/80 | HR 89 | Ht 64.0 in | Wt 174.6 lb

## 2023-09-06 DIAGNOSIS — C73 Malignant neoplasm of thyroid gland: Secondary | ICD-10-CM

## 2023-09-06 DIAGNOSIS — E890 Postprocedural hypothyroidism: Secondary | ICD-10-CM | POA: Diagnosis not present

## 2023-09-06 NOTE — Progress Notes (Addendum)
Patient ID: [name], female   DOB: 11/21/1965, 57 y.o.   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  She previously saw Dr. Everardo All, but last visit with me 8 months ago.  Interim history: She feels well at today's visit, without complaints other than hair thinning. She quit smoking 7 weeks ago. She is taking care of her father, who is sick.  Reviewed and addended history: Pt. was dx with thyroid cancer in 2012.   10/12: Total thyroidectomy (Dr. Ezzard Standing): 1.7 cm left lobe PTC, with 2 pos nodes (T1b N1 M0):    10/12: RAI rx 158 mCi  09/25/2011: post-therapy WBS: pos at right neck, nodes vs remnant.   1.  Focal uptake within the thyroid bed just right of midline.  Differential includes residual thyroid tissue versus lymph node activity.  2. No evidence of distant metastasis.   Neck U/S (04/07/2023):  Surgical changes of total thyroidectomy without evidence of residual or recurrent thyroid tissue, nodularity or lymphadenopathy.  Reviewed thyroglobulin and Tg antibodies: Lab Results  Component Value Date   THYROGLB 0.1 (L) 12/25/2022   THYROGLB 0.3 (L) 03/09/2022   THYROGLB 0.1 (L) 01/10/2021   THYROGLB 0.2 (L) 12/14/2019   THYROGLB 0.1 (L) 01/24/2019   THYROGLB 0.2 (L) 02/01/2017   THYROGLB 0.2 (L) 12/11/2015   THYROGLB 0.1 (L) 02/06/2015   THYROGLB <0.2 02/01/2014   THYROGLB <0.2 01/03/2013   THYROGLB 0.7 05/10/2012   THYROGLB 2.3 01/05/2012   Lab Results  Component Value Date   THGAB <1 12/25/2022   THGAB <1 03/09/2022   THGAB <1 01/10/2021   THGAB <1 12/14/2019   THGAB <1 01/24/2019   THGAB <1 02/01/2017   THGAB <1 12/11/2015   THGAB <1 02/06/2015   THGAB <20.0 02/01/2014   THGAB <20.0 01/03/2013   THGAB <20.0 01/05/2012   Pt denies: - feeling nodules in neck - hoarseness - dysphagia - choking  Postsurgical  hypothyroidism:  Pt is on levothyroxine 112 mcg daily, taken: - in am(4:30 am), with coffee - fasting - at least 30 min (maybe 2 hours) from b'fast - no calcium - no iron - stopped multivitamins - no PPIs - on vitamin D, vitamin C and fish oil; hair vitamins - with Biotin - 2500 mcg  - last dose today!  I reviewed pt's thyroid tests: Lab Results  Component Value Date   TSH 1.22 12/25/2022   TSH 1.46 02/23/2022   TSH 2.70 06/11/2021   TSH 0.95 01/10/2021   TSH 0.09 (L) 12/14/2019   TSH 0.08 (L) 09/25/2019   TSH 0.36 09/20/2018   TSH 0.41 11/01/2017   TSH 0.07 (L) 09/15/2017   TSH 0.82 02/01/2017   FREET4 1.14 01/10/2021   FREET4 1.40 12/14/2019   FREET4 1.14 09/20/2018   FREET4 1.17 11/01/2017   FREET4 1.14 08/13/2015   FREET4 1.56 05/04/2013    Pt denies: - fatigue - weight gain - cold intolerance - constipation  She has: - hair loss - dry skin  She has + FH of thyroid disorders -  thyroid cancer in sister, father. No h/o radiation tx to head or neck except for RAI treatment.  ROS: + see HPI  Past Medical History:  Diagnosis Date   ALLERGIC RHINITIS    Anemia    ANXIETY    DEPRESSION    Encounter for chronic pain management 10/25/2020   Fibroid    HYPERLIPIDEMIA  diet controlled, no meds   Hypertension    Hypothyroidism    Lichen sclerosus of female genitalia 2018   Thyroid carcinoma [facility])    Past Surgical History:  Procedure Laterality Date   ABDOMINAL HYSTERECTOMY N/A 01/01/2015   Procedure:  SUPRACERVICAL HYSTERECTOMY WITH BILATERAL SALPINGECTOMT  Surgeon: Jacqualin Combes de Gwenevere Ghazi, MD;  Location: [facility];  Service: Gynecology;  Laterality: N/A;   BILATERAL SALPINGECTOMY Bilateral 01/01/2015   Procedure: BILATERAL SALPINGECTOMY        ;  Surgeon: Forrestine Him Amundson de Gwenevere Ghazi, MD;  Location: [facility];  Service: Gynecology;  Laterality: Bilateral;   CESAREAN SECTION  2002   x 1   TOTAL THYROIDECTOMY  2009/2010   TUBAL LIGATION      WISDOM TOOTH EXTRACTION     Social History   Socioeconomic History   Marital status: Married    Spouse name: Not on file   Number of children: 1   Years of education: Not on file   Highest education level: Not on file  Occupational History   Occupation: customer service  Tobacco Use   Smoking status: Former    Current packs/day: 0.00    Average packs/day: 0.5 packs/day for 31.0 years (15.5 ttl pk-yrs)    Types: Cigarettes    Start date: 02/21/1992    Quit date: 02/21/2023    Years since quitting: 0.5   Smokeless tobacco: Never  Vaping Use   Vaping status: Former  Substance and Sexual Activity   Alcohol use: Yes    Alcohol/week: 6.0 standard drinks of alcohol    Types: 6 Cans of beer per week    Comment: 6 beers per week   Drug use: No   Sexual activity: Not Currently    Partners: Male    Birth control/protection: Surgical    Comment: tubal/TAH  Other Topics Concern   Not on file  Social History Narrative   Not on file   Social Determinants of Health   Financial Resource Strain: Not on file  Food Insecurity: Not on file  Transportation Needs: Not on file  Physical Activity: Not on file  Stress: Not on file  Social Connections: Not on file  Intimate Partner Violence: Not on file   Current Outpatient Medications on File Prior to Visit  Medication Sig Dispense Refill   ALPRAZolam (XANAX) 0.5 MG tablet Take 1 tablet (0.5 mg total) by mouth 2 (two) times daily as needed for anxiety. 60 tablet 2   amLODipine (NORVASC) 5 MG tablet Take 1 tablet (5 mg total) by mouth daily. 90 tablet 2   Ascorbic Acid (VITAMIN C) 1000 MG tablet Take 1,000 mg by mouth daily.     betamethasone valerate ointment (VALISONE) 0.1 % Apply to vulva twice daily for 2 weeks prn. May use twice weekly for maintenance dose. 45 g 0   BIOTIN PO Take 1 tablet by mouth daily.      cetirizine (ZYRTEC) 10 MG tablet Take 1 tablet (10 mg total) by mouth daily. 30 tablet 11   Cholecalciferol (VITAMIN D-3 PO)  Take 1 capsule by mouth daily.     cyanocobalamin 1000 MCG tablet Take 1,000 mcg by mouth daily.     Evolocumab (REPATHA SURECLICK) 140 MG/ML SOAJ Inject 140 mg into the skin every 14 (fourteen) days. 6 mL 3   fluticasone (FLONASE) 50 MCG/ACT nasal spray Place 2 sprays into both nostrils daily. 16 g 6   levothyroxine (SYNTHROID) 112 MCG tablet Take 1 tablet (  112 mcg total) by mouth daily. 90 tablet 2   losartan (COZAAR) 100 MG tablet Take 1 tablet (100 mg total) by mouth daily. 90 tablet 2   Multiple Vitamin (MULTIVITAMIN) capsule Take 1 capsule by mouth daily.     Omega-3 Fatty Acids (FISH OIL PO) Take 1 capsule by mouth daily.      polyethylene glycol (MIRALAX) 17 g packet Take 34 g by mouth 2 (two) times daily. You may increase as needed 14 each 0   No current facility-administered medications on file prior to visit.   Allergies  Allergen Reactions   Doxycycline Nausea Only   Sulfonamide Derivatives Itching   Family History  Problem Relation Age of Onset   Breast cancer Mother 46   Colon polyps Father    Diabetes Father    COPD Father    Heart failure Father    Kidney disease Father    Cancer Maternal Grandmother    Breast cancer Paternal Grandmother    Hypertension Other    Stroke Other    Colon cancer Neg Hx    Esophageal cancer Neg Hx    Stomach cancer Neg Hx    Rectal cancer Neg Hx    PE: BP 120/80   Pulse 89   Ht 5\' 4"  (1.626 m)   Wt 174 lb 9.6 oz (79.2 kg)   LMP 12/05/2014 (Approximate)   SpO2 99%   BMI 29.97 kg/m  Wt Readings from Last 3 Encounters:  09/06/23 174 lb 9.6 oz (79.2 kg)  08/31/23 174 lb (78.9 kg)  08/25/23 172 lb 9.6 oz (78.3 kg)   Constitutional: overweight, in NAD Eyes:  EOMI, no exophthalmos ENT: no neck masses, thyroidectomy scar healed, some scar tissue palpated in the thyroidectomy bed, no cervical lymphadenopathy Cardiovascular:  RRR, No MRG Respiratory: CTA B Musculoskeletal: no deformities Skin:no rashes Neurological: no tremor  with outstretched hands  ASSESSMENT: 1. Thyroid cancer - see HPI  2. Postsurgical Hypothyroidism  PLAN:  1. Thyroid cancer - papillary -She is clinically stage I TNM (due to young age at diagnosis and lack of distant metastasis) but likely intermediate risk due to presence of positive lymph nodes (she was not aware of this at the time of our first visit) -She had RAI treatment in 2012 and we discussed that the main role of this is to facilitate monitoring in the long run, by checking thyroglobulin. Also, we can ablate positive lymph nodes and metastasis sites. -Her thyroglobulin is not undetectable, but low above the detection point, and without an upward trend. -At last visit, thyroglobulin was lower, barely detectable, at 0.1, decreased from 0.3.  ATA antibodies were undetectable -At last visit, since she did not have imaging in 12 years, we obtained a neck ultrasound.  This was negative for any suspicious masses -At today's visit, she has no neck compression symptoms.  Will check a thyroglobulin and ATA antibodies at the next lab draw and would have a low threshold for whole-body scan if Tg is higher  2.  Patient with history of total thyroidectomy for thyroid cancer, now with iatrogenic hypothyroidism, on levothyroxine therapy - latest thyroid labs reviewed with pt. >> normal: Lab Results  Component Value Date   TSH 1.22 12/25/2022  - she continues on LT4 112 mcg daily - pt feels good on this dose with no complaints other than hair loss - we discussed about taking the thyroid hormone every day, with water, >30 minutes before breakfast, separated by >4 hours from acid reflux medications,  calcium, iron, multivitamins. Pt. is taking it correctly. - will check thyroid tests in several days (we cannot check today because she took 2500 mcg biotin this morning): TSH and fT4 - If labs are abnormal, she will need to return for repeat TFTs in 1.5 months - OTW, I will see her back in 1  year  Orders Placed This Encounter  Procedures   T4, free   TSH   Thyroglobulin antibody   Thyroglobulin Level  Needs refills after the results are back-for 1 year.  Component     Latest Ref Rng 09/30/2023  Thyroglobulin Ab     < or = 1 IU/mL <1   Thyroglobulin     ng/mL 0.1 (L)   Comment --   TSH     0.35 - 5.50 uIU/mL 0.77   T4,Free(Direct)     0.60 - 1.60 ng/dL 1.61   Tests are at goal.  Carlus Pavlov, MD PhD [facility]

## 2023-09-06 NOTE — Patient Instructions (Addendum)
Please continue  Levothyroxine 112 mcg daily.  Take the thyroid hormone every day, with water, at least 30 minutes before breakfast, separated by at least 4 hours from: - acid reflux medications - calcium - iron - multivitamins  Please stop Biotin and come back for labs in ~4 days.  Please return to the clinic for another visit in 1 year.

## 2023-09-07 ENCOUNTER — Encounter: Payer: Self-pay | Admitting: Obstetrics and Gynecology

## 2023-09-30 ENCOUNTER — Other Ambulatory Visit (INDEPENDENT_AMBULATORY_CARE_PROVIDER_SITE_OTHER): Payer: BC Managed Care – PPO

## 2023-09-30 ENCOUNTER — Ambulatory Visit (INDEPENDENT_AMBULATORY_CARE_PROVIDER_SITE_OTHER): Payer: BC Managed Care – PPO | Admitting: Internal Medicine

## 2023-09-30 ENCOUNTER — Encounter: Payer: Self-pay | Admitting: Internal Medicine

## 2023-09-30 VITALS — BP 122/70 | HR 80 | Temp 97.8°F | Ht 64.0 in | Wt 174.0 lb

## 2023-09-30 DIAGNOSIS — J309 Allergic rhinitis, unspecified: Secondary | ICD-10-CM

## 2023-09-30 DIAGNOSIS — I10 Essential (primary) hypertension: Secondary | ICD-10-CM | POA: Diagnosis not present

## 2023-09-30 DIAGNOSIS — E7800 Pure hypercholesterolemia, unspecified: Secondary | ICD-10-CM | POA: Diagnosis not present

## 2023-09-30 DIAGNOSIS — R739 Hyperglycemia, unspecified: Secondary | ICD-10-CM | POA: Diagnosis not present

## 2023-09-30 DIAGNOSIS — C73 Malignant neoplasm of thyroid gland: Secondary | ICD-10-CM

## 2023-09-30 DIAGNOSIS — E559 Vitamin D deficiency, unspecified: Secondary | ICD-10-CM | POA: Diagnosis not present

## 2023-09-30 DIAGNOSIS — E890 Postprocedural hypothyroidism: Secondary | ICD-10-CM | POA: Diagnosis not present

## 2023-09-30 LAB — T4, FREE: Free T4: 1.03 ng/dL (ref 0.60–1.60)

## 2023-09-30 LAB — TSH: TSH: 0.77 u[IU]/mL (ref 0.35–5.50)

## 2023-09-30 MED ORDER — NEXLETOL 180 MG PO TABS: ORAL_TABLET | ORAL | 3 refills | Status: DC

## 2023-09-30 NOTE — Progress Notes (Signed)
Patient ID: [name], female   DOB: 10/15/1966, 57 y.o.   MRN: [id number]        Chief Complaint: follow up HTN, HLD and hyperglycemia, low vit d, allergies       HPI:  [name] is a 57 y.o. female here overall doing ok,  Pt denies chest pain, increased sob or doe, wheezing, orthopnea, PND, increased LE swelling, palpitations, dizziness or syncope.   Pt denies polydipsia, polyuria, or new focal neuro s/s.    Pt denies fever, wt loss, night sweats, loss of appetite, or other constitutional symptoms  Has had myalgias with statin, unable to tolerate.  Also has 1 mo worsening bilateral groin pain unclear etioloyg, worse to walk, better to sit. Quit smoking x 10 wks. No wt gain despite more stress with father dying soon.    Due for Card Ct score in dec 2024,  Does have several wks ongoing nasal allergy symptoms with clearish congestion, itch and sneezing, without fever, pain, ST, cough, swelling or wheezing.  Wt Readings from Last 3 Encounters:  09/30/23 174 lb (78.9 kg)  09/06/23 174 lb 9.6 oz (79.2 kg)  08/31/23 174 lb (78.9 kg)   BP Readings from Last 3 Encounters:  09/30/23 122/70  09/06/23 120/80  08/31/23 120/84         Past Medical History:  Diagnosis Date   ALLERGIC RHINITIS    Anemia    ANXIETY    DEPRESSION    Encounter for chronic pain management 10/25/2020   Fibroid    HYPERLIPIDEMIA    diet controlled, no meds   Hypertension    Hypothyroidism    Lichen sclerosus of female genitalia 2018   Thyroid carcinoma [facility])    Past Surgical History:  Procedure Laterality Date   ABDOMINAL HYSTERECTOMY N/A 01/01/2015   Procedure:  SUPRACERVICAL HYSTERECTOMY WITH BILATERAL SALPINGECTOMT  Surgeon: Forrestine Him Amundson de Gwenevere Ghazi, MD;  Location: [facility];  Service: Gynecology;  Laterality: N/A;   BILATERAL SALPINGECTOMY Bilateral 01/01/2015   Procedure: BILATERAL SALPINGECTOMY        ;  Surgeon: Forrestine Him Amundson de Gwenevere Ghazi, MD;  Location: [facility];  Service:  Gynecology;  Laterality: Bilateral;   CESAREAN SECTION  2002   x 1   TOTAL THYROIDECTOMY  2009/2010   TUBAL LIGATION     WISDOM TOOTH EXTRACTION      reports that she quit smoking about 7 months ago. Her smoking use included cigarettes. She started smoking about 31 years ago. She has a 15.5 pack-year smoking history. She has never used smokeless tobacco. She reports current alcohol use of about 6.0 standard drinks of alcohol per week. She reports that she does not use drugs. family history includes Breast cancer in her paternal grandmother; Breast cancer (age of onset: 40) in her mother; COPD in her father; Cancer in her maternal grandmother; Colon polyps in her father; Diabetes in her father; Heart failure in her father; Hypertension in an other family member; Kidney disease in her father; Stroke in an other family member. Allergies  Allergen Reactions   Doxycycline Nausea Only   Sulfonamide Derivatives Itching   Current Outpatient Medications on File Prior to Visit  Medication Sig Dispense Refill   ALPRAZolam (XANAX) 0.5 MG tablet Take 1 tablet (0.5 mg total) by mouth 2 (two) times daily as needed for anxiety. 60 tablet 2   Ascorbic Acid (VITAMIN C) 1000 MG tablet Take 1,000 mg by mouth daily.  betamethasone valerate ointment (VALISONE) 0.1 % Apply to vulva twice daily for 2 weeks prn. May use twice weekly for maintenance dose. 45 g 0   BIOTIN PO Take 1 tablet by mouth daily.      Cholecalciferol (VITAMIN D-3 PO) Take 1 capsule by mouth daily.     cyanocobalamin 1000 MCG tablet Take 1,000 mcg by mouth daily.     Evolocumab (REPATHA SURECLICK) 140 MG/ML SOAJ Inject 140 mg into the skin every 14 (fourteen) days. 6 mL 3   fluticasone (FLONASE) 50 MCG/ACT nasal spray Place 2 sprays into both nostrils daily. 16 g 6   levothyroxine (SYNTHROID) 112 MCG tablet Take 1 tablet (112 mcg total) by mouth daily. 90 tablet 2   losartan (COZAAR) 100 MG tablet Take 1 tablet (100 mg total) by mouth daily.  90 tablet 2   Multiple Vitamin (MULTIVITAMIN) capsule Take 1 capsule by mouth daily.     Omega-3 Fatty Acids (FISH OIL PO) Take 1 capsule by mouth daily.      polyethylene glycol (MIRALAX) 17 g packet Take 34 g by mouth 2 (two) times daily. You may increase as needed 14 each 0   amLODipine (NORVASC) 5 MG tablet Take 1 tablet (5 mg total) by mouth daily. (Patient not taking: Reported on 09/06/2023) 90 tablet 2   cetirizine (ZYRTEC) 10 MG tablet Take 1 tablet (10 mg total) by mouth daily. 30 tablet 11   No current facility-administered medications on file prior to visit.        ROS:  All others reviewed and negative.  Objective        PE:  BP 122/70 (BP Location: Left Arm, Patient Position: Sitting, Cuff Size: Normal)   Pulse 80   Temp 97.8 F (36.6 C) (Oral)   Ht 5\' 4"  (1.626 m)   Wt 174 lb (78.9 kg)   LMP 12/05/2014 (Approximate)   SpO2 98%   BMI 29.87 kg/m                 Constitutional: Pt appears in NAD               HENT: Head: NCAT.                Right Ear: External ear normal.                 Left Ear: External ear normal.                Eyes: . Pupils are equal, round, and reactive to light. Conjunctivae and EOM are normal               Nose: without d/c or deformity               Neck: Neck supple. Gross normal ROM               Cardiovascular: Normal rate and regular rhythm.                 Pulmonary/Chest: Effort normal and breath sounds without rales or wheezing.                Abd:  Soft, NT, ND, + BS, no organomegaly               Neurological: Pt is alert. At baseline orientation, motor grossly intact               Skin: Skin is warm. No rashes, no other new lesions, LE edema -  none               Psychiatric: Pt behavior is normal without agitation   Micro: none  Cardiac tracings I have personally interpreted today:  none  Pertinent Radiological findings (summarize): none   Lab Results  Component Value Date   WBC 8.6 12/25/2022   HGB 14.1 12/25/2022    HCT 41.4 12/25/2022   PLT 427.0 (H) 12/25/2022   GLUCOSE 72 12/25/2022   CHOL 156 12/25/2022   TRIG 104.0 12/25/2022   HDL 55.30 12/25/2022   LDLDIRECT 131.0 03/10/2007   LDLCALC 80 12/25/2022   ALT 20 12/25/2022   AST 20 12/25/2022   NA 140 12/25/2022   K 3.6 12/25/2022   CL 102 12/25/2022   CREATININE 0.64 12/25/2022   BUN 14 12/25/2022   CO2 28 12/25/2022   TSH 0.77 09/30/2023   HGBA1C 6.1 12/25/2022   MICROALBUR <0.7 12/25/2022   Assessment/Plan:  Bliss S Phariss is a 57 y.o. White or Caucasian [1] female with  has a past medical history of ALLERGIC RHINITIS, Anemia, ANXIETY, DEPRESSION, Encounter for chronic pain management (10/25/2020), Fibroid, HYPERLIPIDEMIA, Hypertension, Hypothyroidism, Lichen sclerosus of female genitalia (2018), and Thyroid carcinoma (HCC).  Vitamin D deficiency Last vitamin D Lab Results  Component Value Date   VD25OH 37.49 12/25/2022   Low, to start oral replacement   Hyperlipidemia Lab Results  Component Value Date   LDLCALC 80 12/25/2022   Stable, pt to change statin to nexlitol as just does not want to take repatha shots   Hyperglycemia Lab Results  Component Value Date   HGBA1C 6.1 12/25/2022   Stable, pt to continue current medical treatment  - diet, wt control   HTN (hypertension) BP Readings from Last 3 Encounters:  09/30/23 122/70  09/06/23 120/80  08/31/23 120/84   Stable, pt to continue medical treatment losartan 100 every day, norvasc 5 qd   Allergic rhinitis Mild to mod, for otc allegra and/or nasacort asd,  to f/u any worsening symptoms or concerns  Followup: Return in about 3 months (around 12/31/2023).  Oliver Barre, MD 10/02/2023 5:04 PM [facility]

## 2023-09-30 NOTE — Patient Instructions (Signed)
Ok to change the statin to the Nexlitol if affordable  Please continue all other medications as before, and refills have been done if requested.  Please have the pharmacy call with any other refills you may need.  Please continue your efforts at being more active, low cholesterol diet, and weight control.  You are otherwise up to date with prevention measures today.  Please keep your appointments with your specialists as you may have planned  Please make an Appointment to return in 3 months, or sooner if needed, also with Lab Appointment for testing done 3-5 days before at the FIRST FLOOR Lab (so this is for TWO appointments - please see the scheduling desk as you leave)

## 2023-10-01 LAB — THYROGLOBULIN LEVEL: Thyroglobulin: 0.1 ng/mL — ABNORMAL LOW

## 2023-10-01 LAB — THYROGLOBULIN ANTIBODY: Thyroglobulin Ab: <1 [IU]/mL (ref <=1)

## 2023-10-02 ENCOUNTER — Encounter: Payer: Self-pay | Admitting: Internal Medicine

## 2023-10-02 NOTE — Assessment & Plan Note (Signed)
Lab Results  Component Value Date   LDLCALC 80 12/25/2022   Stable, pt to change statin to nexlitol as just does not want to take repatha shots

## 2023-10-02 NOTE — Assessment & Plan Note (Signed)
Last vitamin D Lab Results  Component Value Date   VD25OH 37.49 12/25/2022   Low, to start oral replacement

## 2023-10-02 NOTE — Assessment & Plan Note (Signed)
Lab Results  Component Value Date   HGBA1C 6.1 12/25/2022   Stable, pt to continue current medical treatment  - diet, wt control

## 2023-10-02 NOTE — Assessment & Plan Note (Signed)
BP Readings from Last 3 Encounters:  09/30/23 122/70  09/06/23 120/80  08/31/23 120/84   Stable, pt to continue medical treatment losartan 100 every day, norvasc 5 qd

## 2023-10-02 NOTE — Assessment & Plan Note (Signed)
Mild to mod, for otc allegra and/or nasacort asd, to f/u any worsening symptoms or concerns

## 2023-10-02 NOTE — Addendum Note (Signed)
Addended by: Corwin Levins on: 10/02/2023 05:05 PM   Modules accepted: Level of Service

## 2023-10-04 MED ORDER — LEVOTHYROXINE SODIUM 112 MCG PO TABS: 112.0000 ug | ORAL_TABLET | Freq: Every day | ORAL | 3 refills | Status: DC

## 2023-10-04 NOTE — Addendum Note (Signed)
Addended by: Carlus Pavlov on: 10/04/2023 02:06 PM   Modules accepted: Orders

## 2023-10-05 ENCOUNTER — Other Ambulatory Visit (HOSPITAL_COMMUNITY): Payer: Self-pay

## 2023-10-27 ENCOUNTER — Telehealth: Payer: Self-pay

## 2023-10-27 NOTE — Telephone Encounter (Signed)
*  Primary  Pharmacy Patient Advocate Encounter  Received notification from [facility] [place]  that Prior Authorization for Nexletol 180MG  tablets  has been APPROVED from 10/27/2023 to unknown   PA #/Case ID/Reference #: [id number]

## 2023-11-04 ENCOUNTER — Ambulatory Visit (HOSPITAL_BASED_OUTPATIENT_CLINIC_OR_DEPARTMENT_OTHER)
Admission: RE | Admit: 2023-11-04 | Discharge: 2023-11-04 | Disposition: A | Discharge status: Home / Self Care | Payer: Self-pay | Source: Ambulatory Visit | Attending: Internal Medicine | Admitting: Internal Medicine

## 2023-11-04 DIAGNOSIS — R739 Hyperglycemia, unspecified: Secondary | ICD-10-CM | POA: Insufficient documentation

## 2023-11-04 DIAGNOSIS — I10 Essential (primary) hypertension: Secondary | ICD-10-CM | POA: Insufficient documentation

## 2023-11-04 DIAGNOSIS — R9431 Abnormal electrocardiogram [ECG] [EKG]: Secondary | ICD-10-CM | POA: Insufficient documentation

## 2023-11-04 DIAGNOSIS — E7800 Pure hypercholesterolemia, unspecified: Secondary | ICD-10-CM | POA: Insufficient documentation

## 2023-11-05 ENCOUNTER — Encounter: Payer: Self-pay | Admitting: Internal Medicine

## 2023-11-05 ENCOUNTER — Ambulatory Visit: Payer: Self-pay | Admitting: Internal Medicine

## 2023-11-05 ENCOUNTER — Other Ambulatory Visit: Payer: Self-pay | Admitting: Internal Medicine

## 2023-11-05 DIAGNOSIS — R931 Abnormal findings on diagnostic imaging of heart and coronary circulation: Secondary | ICD-10-CM

## 2023-11-05 DIAGNOSIS — I2510 Atherosclerotic heart disease of native coronary artery without angina pectoris: Secondary | ICD-10-CM

## 2023-11-05 MED ORDER — REPATHA SURECLICK 140 MG/ML ~~LOC~~ SOAJ: 140.0000 mg | SUBCUTANEOUS | 3 refills | Status: DC

## 2023-11-05 NOTE — Telephone Encounter (Signed)
Copied from CRM [id number]. Topic: Clinical - Red Word Triage >> Nov 05, 2023  3:39 PM Pascal Lux wrote: Red Word that prompted transfer to Nurse Triage: Patient stated her cholesterol is high and needs to know if she should double up on her Statins before she goes to the heart doctor because she's been feeling dizzy when she gets up after bending down. Offered if would like to speak with a nurse and transferred.   Chief Complaint: Medication Question  Disposition: [] ED /[] Urgent Care (no appt availability in office) / [] Appointment(In office/virtual)/ []  [facility]/ [] Home Care/ [] Refused Recommended Disposition /[] [facility]/ [x]  Follow-up with PCP Additional Notes: Patient states that she had a heart MRI done yesterday and the score was 175.  She states she was referred to a heart doctor.  She also states that her PCP told her to continue taking two medications that she does not have.  She states that she is only on Rosuvastatin 10mg .  The other two medications are Nexletol and Repatha that she was told to continue taking and she doesn't have either of these and states that her insurance denied the Repatha.  Patient is very concerned about her heart MRI result and wants to know if she can take double of her Rosuvastatin 10mg  medication until she can see the heart doctor.  Patient is very concerned.  She also added that the only thing she has had at all symptom wise is two episodes where she squatted down, got up, and felt a little dizzy but that feeling quickly went away.  This RN called the clinical access line and spoke with Maralyn Sago about the situation.  She advised that she would go speak with the patient's PCP's Nurse and tell them that the patient called wanting to speak with her PCP about the medications.  Reason for Disposition  [1] Caller has NON-URGENT medicine question about med that PCP prescribed AND [2] triager unable to answer question  Answer Assessment - Initial  Assessment Questions 1. NAME of MEDICINE: "What medicine(s) are you calling about?"     Rosuvastatin 10mg  2. QUESTION: "What is your question?" (e.g., double dose of medicine, side effect)     Wants to know if she can double the dose because you had a heart MRI yesterday and the score was 175 and was referred to a heart doctor. Patient states she was told by her PCP to continue two medications that she is not on.  Rapatha was denied and Nexatrol was not prescribed 3. PRESCRIBER: "Who prescribed the medicine?" Reason: if prescribed by specialist, call should be referred to that group.     Patient states her PCP told her to take these and she was prescribed Rosuvastatin by her PCP 4. SYMPTOMS: "Do you have any symptoms?" If Yes, ask: "What symptoms are you having?"  "How bad are the symptoms (e.g., mild, moderate, severe)     No, but patient states she had two times where she bent down and got up and felt dizzy.  Approximately three days ago was the last time this happened but patient states that went away and she was not concerned about that.  Protocols used: Medication Question Call-A-AH

## 2023-11-09 NOTE — Telephone Encounter (Signed)
No need for med changes at this time, just needs to f/u with cardiology as referred, thanks

## 2023-11-11 ENCOUNTER — Encounter: Payer: Self-pay | Admitting: Radiology

## 2023-11-12 ENCOUNTER — Other Ambulatory Visit (HOSPITAL_COMMUNITY): Payer: Self-pay

## 2023-11-12 ENCOUNTER — Telehealth: Payer: Self-pay | Admitting: Pharmacy Technician

## 2023-11-12 NOTE — Telephone Encounter (Signed)
Ok to hold on the pA then, thanks

## 2023-11-12 NOTE — Telephone Encounter (Signed)
Prior Authorization form/request asks a question that requires your assistance. Please see the question below and advise accordingly.   **Please see previous denial of Repatha back in August.**  PA is asking questions that I believe will leave to a denial again. I am not finding that the patient has any of these requirements. Please advise if we should continue with the PA.

## 2023-11-18 ENCOUNTER — Other Ambulatory Visit: Payer: Self-pay

## 2023-11-18 DIAGNOSIS — K862 Cyst of pancreas: Secondary | ICD-10-CM

## 2023-12-03 ENCOUNTER — Ambulatory Visit: Payer: BC Managed Care – PPO | Admitting: Nurse Practitioner

## 2023-12-03 ENCOUNTER — Other Ambulatory Visit: Payer: Self-pay | Admitting: Nurse Practitioner

## 2023-12-03 VITALS — BP 128/96 | HR 86 | Temp 98.1°F | Ht 64.0 in | Wt 173.0 lb

## 2023-12-03 DIAGNOSIS — R1013 Epigastric pain: Secondary | ICD-10-CM | POA: Diagnosis not present

## 2023-12-03 DIAGNOSIS — E7800 Pure hypercholesterolemia, unspecified: Secondary | ICD-10-CM

## 2023-12-03 MED ORDER — NEXLETOL 180 MG PO TABS: 1.0000 | ORAL_TABLET | Freq: Every day | ORAL | 1 refills | Status: DC

## 2023-12-03 MED ORDER — OMEPRAZOLE 20 MG PO CPDR: 20.0000 mg | DELAYED_RELEASE_CAPSULE | Freq: Every day | ORAL | 3 refills | Status: DC

## 2023-12-03 NOTE — Assessment & Plan Note (Signed)
Acute, intermittent Appears to be GERD or gastritis based on history and physical exam.  EKG completed today which identified normal sinus rhythm and no ST segment abnormalities.  Discomfort is not triggered by physical exertion or improved with physical rest.  Patient does have risk factors for coronary artery disease but based on presentation today this does not seem consistent with acute coronary syndrome.  I have recommended trialing omeprazole 20 mg a mouth daily x 6 weeks then slow taper over 2 weeks with return to clinic in 2 weeks for further evaluation.  At that time if symptoms are still present may consider referral to gastroenterology. In the meantime patient was educated that if her symptoms continue and get more severe or seem to last longer than they have been then she should proceed to the emergency department to rule out acute coronary syndrome.  She reports her understanding.  She was also encouraged to follow-up with cardiology as scheduled.

## 2023-12-03 NOTE — Progress Notes (Signed)
Established Patient Office Visit  Subjective   Patient ID: [name], female    DOB: Jun 20, 1966  Age: 58 y.o. MRN: [id number]  Chief Complaint  Patient presents with   Chest Pain    Started Wednesday, dull pain starts right in the middle of abdomin. Notice it after she eats. Pain does come and goes     Epigastric Pain: Patient arrives today for 2-day episode of epigastric pain.  She reports her pain is intermittent often triggered by food.  Feels like a pulling sensation and sometimes a dullness.  Pain is not triggered by exertion or relieved by rest.  Pain seems to come and go whether she is moving about or laying down.  Has even woken her up out of sleep before.  She is a previous smoker, but has quit.  She has been experiencing abdominal bloating and underwent MRI of the abdomen back in October 2024.  This identified cyst in the liver, complex cystic lesion in the pancreas (has repeat MRI scheduled per radiology recommendations), and aortic atherosclerosis.  Gallbladder, spleen, stomach and bowel were unremarkable.  She later underwent CT calcium score which identified a CT calcium score of 175 which was 97th percentile for her age.  She reports that she is currently on rosuvastatin 10 mg by mouth daily.  She has an upcoming appoint with cardiology in about 1 month for further evaluation.  Last LDL was 80.  Of note, prior authorization was attempted for Repatha and bempedoic acid.  Repatha was not approved but it appears bempedoic acid was approved.  Patient is not taking bempedoic acid currently.  I will reach out to her PCP to determine if she should start this.    ROS: see HPI    Objective:     BP (!) 128/96 (BP Location: Left Arm, Patient Position: Sitting, Cuff Size: Normal)   Pulse 86   Temp 98.1 F (36.7 C) (Oral)   Ht 5\' 4"  (1.626 m)   Wt 173 lb (78.5 kg)   LMP 12/05/2014 (Approximate)   SpO2 94%   BMI 29.70 kg/m  BP Readings from Last 3 Encounters:  12/03/23  (!) 128/96  09/30/23 122/70  09/06/23 120/80   Wt Readings from Last 3 Encounters:  12/03/23 173 lb (78.5 kg)  09/30/23 174 lb (78.9 kg)  09/06/23 174 lb 9.6 oz (79.2 kg)      Physical Exam Vitals reviewed.  Constitutional:      General: She is not in acute distress.    Appearance: Normal appearance.  HENT:     Head: Normocephalic and atraumatic.  Neck:     Vascular: No carotid bruit.  Cardiovascular:     Rate and Rhythm: Normal rate and regular rhythm.     Pulses: Normal pulses.     Heart sounds: Normal heart sounds.  Pulmonary:     Effort: Pulmonary effort is normal.     Breath sounds: Normal breath sounds.  Abdominal:     General: Abdomen is flat. Bowel sounds are normal. There is no distension.     Palpations: Abdomen is soft.     Tenderness: There is abdominal tenderness in the left lower quadrant.  Skin:    General: Skin is warm and dry.  Neurological:     General: No focal deficit present.     Mental Status: She is alert and oriented to person, place, and time.  Psychiatric:        Mood and Affect: Mood normal.  Behavior: Behavior normal.        Judgment: Judgment normal.    EKG: Normal sinus rhythm, no evidence of ST segment abnormalities.   No results found for any visits on 12/03/23.    The 10-year ASCVD risk score (Arnett DK, et al., 2019) is: 2.3%    Assessment & Plan:   Problem List Items Addressed This Visit       Other   Epigastric pain - Primary   Acute, intermittent Appears to be GERD or gastritis based on history and physical exam.  EKG completed today which identified normal sinus rhythm and no ST segment abnormalities.  Discomfort is not triggered by physical exertion or improved with physical rest.  Patient does have risk factors for coronary artery disease but based on presentation today this does not seem consistent with acute coronary syndrome.  I have recommended trialing omeprazole 20 mg a mouth daily x 6 weeks then slow  taper over 2 weeks with return to clinic in 2 weeks for further evaluation.  At that time if symptoms are still present may consider referral to gastroenterology. In the meantime patient was educated that if her symptoms continue and get more severe or seem to last longer than they have been then she should proceed to the emergency department to rule out acute coronary syndrome.  She reports her understanding.  She was also encouraged to follow-up with cardiology as scheduled.      Relevant Medications   omeprazole (PRILOSEC) 20 MG capsule   Other Relevant Orders   EKG 12-Lead    Return in about 8 weeks (around 01/28/2024) for F/U with Dr. Jonny Ruiz.    Elenore Paddy, NP

## 2023-12-03 NOTE — Progress Notes (Signed)
Please call patient or send her a MyChart message letting her know that I did reach out to Dr. Jonny Ruiz about whether or not he thinks she should start on the Nexletol (bempedoic acid) in addition to her rosuvastatin to help further stabilize cholesterol.  He does recommend that she starts the [facility] while waiting to see cardiology next month.  I have sent in prescription to her pharmacy.  It is 1 tablet by mouth daily.  Please let me know if she has any questions.

## 2023-12-07 ENCOUNTER — Other Ambulatory Visit: Payer: Self-pay | Admitting: Nurse Practitioner

## 2023-12-07 ENCOUNTER — Ambulatory Visit (HOSPITAL_COMMUNITY)
Admission: RE | Admit: 2023-12-07 | Discharge: 2023-12-07 | Disposition: A | Discharge status: Home / Self Care | Payer: BC Managed Care – PPO | Source: Ambulatory Visit | Attending: Nurse Practitioner | Admitting: Nurse Practitioner

## 2023-12-07 DIAGNOSIS — K862 Cyst of pancreas: Secondary | ICD-10-CM | POA: Diagnosis not present

## 2023-12-07 DIAGNOSIS — I700 Atherosclerosis of aorta: Secondary | ICD-10-CM | POA: Diagnosis not present

## 2023-12-07 DIAGNOSIS — R932 Abnormal findings on diagnostic imaging of liver and biliary tract: Secondary | ICD-10-CM | POA: Diagnosis not present

## 2023-12-07 MED ORDER — GADOBUTROL 1 MMOL/ML IV SOLN
7.5000 mL | Freq: Once | INTRAVENOUS | Status: AC | PRN
  Administered 2023-12-07: 7.5 mL via INTRAVENOUS

## 2023-12-09 NOTE — Progress Notes (Signed)
Made pt aware, pt had no further questions

## 2024-01-04 ENCOUNTER — Ambulatory Visit: Payer: BC Managed Care – PPO | Attending: Cardiovascular Disease | Admitting: Cardiovascular Disease

## 2024-01-04 ENCOUNTER — Encounter: Payer: Self-pay | Admitting: Cardiovascular Disease

## 2024-01-04 VITALS — BP 118/80 | HR 97 | Ht 64.5 in | Wt 174.0 lb

## 2024-01-04 DIAGNOSIS — E7800 Pure hypercholesterolemia, unspecified: Secondary | ICD-10-CM | POA: Diagnosis not present

## 2024-01-04 DIAGNOSIS — I10 Essential (primary) hypertension: Secondary | ICD-10-CM

## 2024-01-04 DIAGNOSIS — Z7289 Other problems related to lifestyle: Secondary | ICD-10-CM

## 2024-01-04 DIAGNOSIS — R7303 Prediabetes: Secondary | ICD-10-CM

## 2024-01-04 DIAGNOSIS — R931 Abnormal findings on diagnostic imaging of heart and coronary circulation: Secondary | ICD-10-CM | POA: Diagnosis not present

## 2024-01-04 MED ORDER — ROSUVASTATIN CALCIUM 20 MG PO TABS: 20.0000 mg | ORAL_TABLET | Freq: Every day | ORAL | 3 refills | Status: DC

## 2024-01-04 MED ORDER — EZETIMIBE 10 MG PO TABS: 10.0000 mg | ORAL_TABLET | Freq: Every day | ORAL | 3 refills | Status: DC

## 2024-01-04 NOTE — Progress Notes (Signed)
Cardiology Office Note:    Date:  01/04/2024   ID:  [name], DOB 05-May-1966, MRN [id number]  PCP:  Corwin Levins, MD   [facility] Providers Cardiologist:  Thurmon Fair, MD     Referring MD: Corwin Levins, MD   Chief Complaint  Patient presents with   Consult  [name] is a 58 y.o. female who is being seen today for the evaluation of elevated coronary calcium score at the request of Corwin Levins, MD.   History of Present Illness:    [name] is a 58 y.o. female with a hx of hyperlipidemia, hypertension, prediabetes who recent underwent a coronary calcium score which was quite high for age (absolute score 175, 97th percentile).  She has had some epigastric discomfort that is clearly related to meals and improved with acid blocking medications.  She has no exertional chest discomfort or dyspnea.  She has good exercise capacity.  The patient specifically denies any chest pain at rest or with exertion, dyspnea at rest or with exertion, orthopnea, paroxysmal nocturnal dyspnea, syncope, palpitations, focal neurological deficits, intermittent claudication, lower extremity edema, unexplained weight gain, cough, hemoptysis or wheezing.  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  She is euthyroid on levothyroxine supplementation and is followed by the endocrinologist.  2020 she had a nuclear stress test that showed normal findings.  Hard to remember exactly what was done but she thinks she was just having palpitations at the time.  Her most recent lipid profile showed an LDL cholesterol of 81 on rosuvastatin 10 mg once daily.  She has normal HDL and triglyceride levels but her hemoglobin A1c is borderline elevated at 6.1%.  She quit smoking about a year ago and is working on reducing vaping now.  Her father has a history of CHF and COPD and is  quite ill at age 50, but has not had a clear diagnosis of myocardial infarction or coronary disease.  Her recent ECG from 12/03/2023 shows normal sinus rhythm.  There is a QS pattern in leads V1-V2 but suspect this is due to lead placement.  There are no repolarization abnormalities.  She had a normal nuclear stress test in 2020.  Past Medical History:  Diagnosis Date   ALLERGIC RHINITIS    Anemia    ANXIETY    DEPRESSION    Encounter for chronic pain management 10/25/2020   Fibroid    HYPERLIPIDEMIA    diet controlled, no meds   Hypertension    Hypothyroidism    Lichen sclerosus of female genitalia 2018   Thyroid carcinoma [facility])     Past Surgical History:  Procedure Laterality Date   ABDOMINAL HYSTERECTOMY N/A 01/01/2015   Procedure:  SUPRACERVICAL HYSTERECTOMY WITH BILATERAL SALPINGECTOMT  Surgeon: Jacqualin Combes de Gwenevere Ghazi, MD;  Location: [facility];  Service: Gynecology;  Laterality: N/A;   BILATERAL SALPINGECTOMY Bilateral 01/01/2015   Procedure: BILATERAL SALPINGECTOMY        ;  Surgeon: Forrestine Him Amundson de Gwenevere Ghazi, MD;  Location: [facility];  Service: Gynecology;  Laterality: Bilateral;   CESAREAN SECTION  2002   x 1   TOTAL THYROIDECTOMY  2009/2010   TUBAL LIGATION     WISDOM TOOTH EXTRACTION      Current Medications: Current Meds  Medication Sig   ALPRAZolam (XANAX) 0.5 MG tablet Take 1 tablet (0.5 mg total) by  mouth 2 (two) times daily as needed for anxiety.   Ascorbic Acid (VITAMIN C) 1000 MG tablet Take 1,000 mg by mouth daily.   betamethasone valerate ointment (VALISONE) 0.1 % Apply to vulva twice daily for 2 weeks prn. May use twice weekly for maintenance dose.   BIOTIN PO Take 1 tablet by mouth daily.    Cholecalciferol (VITAMIN D-3 PO) Take 1 capsule by mouth daily.   cyanocobalamin 1000 MCG tablet Take 1,000 mcg by mouth daily.   ezetimibe (ZETIA) 10 MG tablet Take 1 tablet (10 mg total) by mouth daily.   levothyroxine (SYNTHROID) 112 MCG tablet  Take 1 tablet (112 mcg total) by mouth daily.   losartan (COZAAR) 100 MG tablet Take 1 tablet (100 mg total) by mouth daily.   Multiple Vitamin (MULTIVITAMIN) capsule Take 1 capsule by mouth daily.   Omega-3 Fatty Acids (FISH OIL PO) Take 1 capsule by mouth daily.    omeprazole (PRILOSEC) 20 MG capsule Take 1 capsule (20 mg total) by mouth daily.   polyethylene glycol (MIRALAX) 17 g packet Take 34 g by mouth 2 (two) times daily. You may increase as needed   rosuvastatin (CRESTOR) 20 MG tablet Take 1 tablet (20 mg total) by mouth daily.   [DISCONTINUED] rosuvastatin (CRESTOR) 10 MG tablet Take 10 mg by mouth daily.     Allergies:   Doxycycline and Sulfonamide derivatives   Social History   Socioeconomic History   Marital status: Married    Spouse name: Not on file   Number of children: 1   Years of education: Not on file   Highest education level: Not on file  Occupational History   Occupation: customer service  Tobacco Use   Smoking status: Former    Current packs/day: 0.00    Average packs/day: 0.5 packs/day for 31.0 years (15.5 ttl pk-yrs)    Types: Cigarettes    Start date: 02/21/1992    Quit date: 02/21/2023    Years since quitting: 0.8   Smokeless tobacco: Current   Tobacco comments:    Vape  Vaping Use   Vaping status: Former  Substance and Sexual Activity   Alcohol use: Yes    Alcohol/week: 6.0 standard drinks of alcohol    Types: 6 Cans of beer per week    Comment: 6 beers per week   Drug use: No   Sexual activity: Not Currently    Partners: Male    Birth control/protection: Surgical    Comment: tubal/TAH  Other Topics Concern   Not on file  Social History Narrative   Not on file   Social Drivers of Health   Financial Resource Strain: Not on file  Food Insecurity: Not on file  Transportation Needs: Not on file  Physical Activity: Not on file  Stress: Not on file  Social Connections: Not on file     Family History: The patient's family history includes  Breast cancer in her paternal grandmother; Breast cancer (age of onset: 8) in her mother; COPD in her father; Cancer in her maternal grandmother; Colon polyps in her father; Diabetes in her father; Heart failure in her father; Hypertension in an other family member; Kidney disease in her father; Stroke in an other family member. There is no history of Colon cancer, Esophageal cancer, Stomach cancer, or Rectal cancer.  ROS:   Please see the history of present illness.     All other systems reviewed and are negative.  EKGs/Labs/Other Studies Reviewed:    The following studies were  reviewed today: Tracing from 12/03/2023 is personally reviewed and shows normal sinus rhythm.  There are no ischemic repolarization abnormalities.  QS pattern in lead V1-V2 is likely due to lead placement.  EKG Interpretation Date/Time:    Ventricular Rate:    PR Interval:    QRS Duration:    QT Interval:    QTC Calculation:   R Axis:      Text Interpretation:               Recent Labs: 09/30/2023: TSH 0.77  Recent Lipid Panel    Component Value Date/Time   CHOL 156 12/25/2022 1511   TRIG 104.0 12/25/2022 1511   HDL 55.30 12/25/2022 1511   CHOLHDL 3 12/25/2022 1511   VLDL 20.8 12/25/2022 1511   LDLCALC 80 12/25/2022 1511   LDLDIRECT 131.0 03/10/2007 1024     Risk Assessment/Calculations:                Physical Exam:    VS:  BP 118/80 (BP Location: Left Arm, Patient Position: Sitting)   Pulse 97   Ht 5' 4.5" (1.638 m)   Wt 174 lb (78.9 kg)   LMP 12/05/2014 (Approximate)   SpO2 97%   BMI 29.41 kg/m     Wt Readings from Last 3 Encounters:  01/04/24 174 lb (78.9 kg)  12/03/23 173 lb (78.5 kg)  09/30/23 174 lb (78.9 kg)     GEN:  Well nourished, well developed in no acute distress HEENT: Normal other than thyroidectomy scar. NECK: No JVD; No carotid bruits LYMPHATICS: No lymphadenopathy CARDIAC: RRR, no murmurs, rubs, gallops RESPIRATORY:  Clear to auscultation without rales,  wheezing or rhonchi  ABDOMEN: Soft, non-tender, non-distended MUSCULOSKELETAL:  No edema; No deformity  SKIN: Warm and dry NEUROLOGIC:  Alert and oriented x 3 PSYCHIATRIC:  Normal affect   ASSESSMENT:    1. Elevated coronary artery calcium score   2. Hypercholesterolemia   3. Prediabetes   4. Essential hypertension   5. Engages in vaping    PLAN:    In order of problems listed above:  Elevated coronary calcium score: She has no symptoms of coronary disease.  The calcium score is very high for age and gender, but not in the range where we would necessarily expect to find obstructive disease.  She had a normal nuclear stress test a few years ago.  Discussed the fact that the elevation in coronary calcium has prognostic value, not diagnostic value.  She has a very active lifestyle without exertional complaints.  No additional testing at this time.  Focus on risk factor modification. HLP: Agree that the target LDL cholesterol should be less than 70.  She is very worried about the potential status of Nexletol.  I think we can reach target with ezetimibe 10 mg once daily added to rosuvastatin 20 mg daily.  Gave her these new prescriptions and plan to recheck lipid profile in a couple of months. Prediabetes: She is working hard to lose weight.  Discussed healthy diet with limited amounts of carbs and saturated fat and increased amount of lean protein and saturated fat, especially vegetable sources. HTN: Well-controlled on the current medications. Vaping: To be congratulated for completely quitting cigarettes, but I think she should gradually wean herself off vapes as well.           Medication Adjustments/Labs and Tests Ordered: Current medicines are reviewed at length with the patient today.  Concerns regarding medicines are outlined above.  No orders of the defined  types were placed in this encounter.  Meds ordered this encounter  Medications   ezetimibe (ZETIA) 10 MG tablet    Sig:  Take 1 tablet (10 mg total) by mouth daily.    Dispense:  90 tablet    Refill:  3   rosuvastatin (CRESTOR) 20 MG tablet    Sig: Take 1 tablet (20 mg total) by mouth daily.    Dispense:  90 tablet    Refill:  3    Patient Instructions  Medication Instructions:  - STOP Bempedoic Acid (NEXLETOL) 180 MG TABS  - START Zetia 10mg  daily    *If you need a refill on your cardiac medications before your next appointment, please call your pharmacy*   Lab Work: None    If you have labs (blood work) drawn today and your tests are completely normal, you will receive your results only by: MyChart Message (if you have MyChart) OR A paper copy in the mail If you have any lab test that is abnormal or we need to change your treatment, we will call you to review the results.   Testing/Procedures: None    Follow-Up: At [facility], you and your health needs are our priority.  As part of our continuing mission to provide you with exceptional heart care, we have created designated Provider Care Teams.  These Care Teams include your primary Cardiologist (physician) and Advanced Practice Providers (APPs -  Physician Assistants and Nurse Practitioners) who all work together to provide you with the care you need, when you need it.  We recommend signing up for the patient portal called "MyChart".  Sign up information is provided on this After Visit Summary.  MyChart is used to connect with patients for Virtual Visits (Telemedicine).  Patients are able to view lab/test results, encounter notes, upcoming appointments, etc.  Non-urgent messages can be sent to your provider as well.   To learn more about what you can do with MyChart, go to [email or web address].    Your next appointment:   1 year(s)  The format for your next appointment:   In Person  Provider:   Thurmon Fair, MD    Other Instructions    Signed, Thurmon Fair, MD  01/04/2024 2:31 PM    [facility]

## 2024-01-04 NOTE — Patient Instructions (Addendum)
Medication Instructions:  - STOP Bempedoic Acid (NEXLETOL) 180 MG TABS  - START Zetia 10mg  daily    *If you need a refill on your cardiac medications before your next appointment, please call your pharmacy*   Lab Work: None    If you have labs (blood work) drawn today and your tests are completely normal, you will receive your results only by: MyChart Message (if you have MyChart) OR A paper copy in the mail If you have any lab test that is abnormal or we need to change your treatment, we will call you to review the results.   Testing/Procedures: None    Follow-Up: At [facility], you and your health needs are our priority.  As part of our continuing mission to provide you with exceptional heart care, we have created designated Provider Care Teams.  These Care Teams include your primary Cardiologist (physician) and Advanced Practice Providers (APPs -  Physician Assistants and Nurse Practitioners) who all work together to provide you with the care you need, when you need it.  We recommend signing up for the patient portal called "MyChart".  Sign up information is provided on this After Visit Summary.  MyChart is used to connect with patients for Virtual Visits (Telemedicine).  Patients are able to view lab/test results, encounter notes, upcoming appointments, etc.  Non-urgent messages can be sent to your provider as well.   To learn more about what you can do with MyChart, go to [email or web address].    Your next appointment:   1 year(s)  The format for your next appointment:   In Person  Provider:   Thurmon Fair, MD    Other Instructions

## 2024-01-12 ENCOUNTER — Other Ambulatory Visit: Payer: Self-pay | Admitting: Internal Medicine

## 2024-01-12 DIAGNOSIS — Z1231 Encounter for screening mammogram for malignant neoplasm of breast: Secondary | ICD-10-CM

## 2024-02-24 ENCOUNTER — Ambulatory Visit: Payer: BC Managed Care – PPO

## 2024-02-29 ENCOUNTER — Ambulatory Visit (INDEPENDENT_AMBULATORY_CARE_PROVIDER_SITE_OTHER)

## 2024-02-29 ENCOUNTER — Ambulatory Visit: Payer: BC Managed Care – PPO | Admitting: Internal Medicine

## 2024-02-29 VITALS — BP 128/72 | HR 75 | Temp 98.1°F | Ht 64.5 in | Wt 170.0 lb

## 2024-02-29 DIAGNOSIS — Z0001 Encounter for general adult medical examination with abnormal findings: Secondary | ICD-10-CM

## 2024-02-29 DIAGNOSIS — M25552 Pain in left hip: Secondary | ICD-10-CM | POA: Insufficient documentation

## 2024-02-29 DIAGNOSIS — E538 Deficiency of other specified B group vitamins: Secondary | ICD-10-CM | POA: Diagnosis not present

## 2024-02-29 DIAGNOSIS — I10 Essential (primary) hypertension: Secondary | ICD-10-CM

## 2024-02-29 DIAGNOSIS — I878 Other specified disorders of veins: Secondary | ICD-10-CM | POA: Diagnosis not present

## 2024-02-29 DIAGNOSIS — E890 Postprocedural hypothyroidism: Secondary | ICD-10-CM

## 2024-02-29 DIAGNOSIS — M160 Bilateral primary osteoarthritis of hip: Secondary | ICD-10-CM | POA: Diagnosis not present

## 2024-02-29 DIAGNOSIS — Z0000 Encounter for general adult medical examination without abnormal findings: Secondary | ICD-10-CM | POA: Diagnosis not present

## 2024-02-29 DIAGNOSIS — M25551 Pain in right hip: Secondary | ICD-10-CM | POA: Diagnosis not present

## 2024-02-29 DIAGNOSIS — E7800 Pure hypercholesterolemia, unspecified: Secondary | ICD-10-CM

## 2024-02-29 DIAGNOSIS — R739 Hyperglycemia, unspecified: Secondary | ICD-10-CM

## 2024-02-29 DIAGNOSIS — E559 Vitamin D deficiency, unspecified: Secondary | ICD-10-CM

## 2024-02-29 LAB — HEPATIC FUNCTION PANEL
ALT: 18 U/L (ref 0–35)
AST: 20 U/L (ref 0–37)
Albumin: 4.5 g/dL (ref 3.5–5.2)
Alkaline Phosphatase: 78 U/L (ref 39–117)
Bilirubin, Direct: 0.1 mg/dL (ref 0.0–0.3)
Total Bilirubin: 0.4 mg/dL (ref 0.2–1.2)
Total Protein: 7.5 g/dL (ref 6.0–8.3)

## 2024-02-29 LAB — CBC WITH DIFFERENTIAL/PLATELET
Basophils Absolute: 0.1 10*3/uL (ref 0.0–0.1)
Basophils Relative: 0.7 % (ref 0.0–3.0)
Eosinophils Absolute: 0.1 10*3/uL (ref 0.0–0.7)
Eosinophils Relative: 1 % (ref 0.0–5.0)
HCT: 41.7 % (ref 36.0–46.0)
Hemoglobin: 14.1 g/dL (ref 12.0–15.0)
Lymphocytes Relative: 45.6 % (ref 12.0–46.0)
Lymphs Abs: 3.7 10*3/uL (ref 0.7–4.0)
MCHC: 33.7 g/dL (ref 30.0–36.0)
MCV: 92.2 fl (ref 78.0–100.0)
Monocytes Absolute: 0.7 10*3/uL (ref 0.1–1.0)
Monocytes Relative: 9.2 % (ref 3.0–12.0)
Neutro Abs: 3.5 10*3/uL (ref 1.4–7.7)
Neutrophils Relative %: 43.5 % (ref 43.0–77.0)
Platelets: 353 10*3/uL (ref 150.0–400.0)
RBC: 4.53 Mil/uL (ref 3.87–5.11)
RDW: 13 % (ref 11.5–15.5)
WBC: 8.1 10*3/uL (ref 4.0–10.5)

## 2024-02-29 LAB — VITAMIN B12: Vitamin B-12: 479 pg/mL (ref 211–911)

## 2024-02-29 LAB — LIPID PANEL
Cholesterol: 125 mg/dL (ref 0–200)
HDL: 55.6 mg/dL (ref >39.00)
LDL Cholesterol: 55 mg/dL (ref 0–99)
NonHDL: 69.12
Total CHOL/HDL Ratio: 2
Triglycerides: 72 mg/dL (ref 0.0–149.0)
VLDL: 14.4 mg/dL (ref 0.0–40.0)

## 2024-02-29 LAB — VITAMIN D 25 HYDROXY (VIT D DEFICIENCY, FRACTURES): VITD: 45.47 ng/mL (ref 30.00–100.00)

## 2024-02-29 LAB — BASIC METABOLIC PANEL WITH GFR
BUN: 14 mg/dL (ref 6–23)
CO2: 29 meq/L (ref 19–32)
Calcium: 9.4 mg/dL (ref 8.4–10.5)
Chloride: 100 meq/L (ref 96–112)
Creatinine, Ser: 0.66 mg/dL (ref 0.40–1.20)
GFR: 98.05 mL/min (ref >60.00)
Glucose, Bld: 87 mg/dL (ref 70–99)
Potassium: 3.9 meq/L (ref 3.5–5.1)
Sodium: 136 meq/L (ref 135–145)

## 2024-02-29 LAB — HEMOGLOBIN A1C: Hgb A1c MFr Bld: 6 % (ref 4.6–6.5)

## 2024-02-29 LAB — TSH: TSH: 0.68 u[IU]/mL (ref 0.35–5.50)

## 2024-02-29 MED ORDER — ALPRAZOLAM 0.5 MG PO TABS: 0.5000 mg | ORAL_TABLET | Freq: Two times a day (BID) | ORAL | 2 refills | Status: DC | PRN

## 2024-02-29 MED ORDER — ROSUVASTATIN CALCIUM 20 MG PO TABS: 20.0000 mg | ORAL_TABLET | Freq: Every day | ORAL | 3 refills | Status: DC

## 2024-02-29 MED ORDER — LOSARTAN POTASSIUM 100 MG PO TABS: 100.0000 mg | ORAL_TABLET | Freq: Every day | ORAL | 3 refills | Status: DC

## 2024-02-29 NOTE — Patient Instructions (Signed)
 Please continue all other medications as before, and refills have been done if requested.  Please have the pharmacy call with any other refills you may need.  Please continue your efforts at being more active, low cholesterol diet, and weight control.  You are otherwise up to date with prevention measures today.  Please keep your appointments with your specialists as you may have planned  Please go to the XRAY Department in the first floor for the x-ray testing  Please go to the LAB at the blood drawing area for the tests to be done  You will be contacted by phone if any changes need to be made immediately.  Otherwise, you will receive a letter about your results with an explanation, but please check with MyChart first.  Please make an Appointment to return for your 1 year visit, or sooner if needed

## 2024-02-29 NOTE — Progress Notes (Unsigned)
 Patient ID: [name], female   DOB: 08-26-1966, 58 y.o.   MRN: [id number]         Chief Complaint:: wellness exam and bilateral hip pain, low vit d, hyperglycemia, htn       HPI:  [name] is a 58 y.o. female here for wellness exam; for prevnar 20 and shingrix at pharmacy, o/w up to date; quit tobacco aug 2024.                         Also Pt denies chest pain, increased sob or doe, wheezing, orthopnea, PND, increased LE swelling, palpitations, dizziness or syncope.   Pt denies polydipsia, polyuria, or new focal neuro s/s.   Pt denies fever, wt loss, night sweats, loss of appetite, or other constitutional symptoms  Does have new several wks of stiffness and discomfort at bilateral groin / hip areas unusual for her, no trauma or falls.   Wt Readings from Last 3 Encounters:  02/29/24 170 lb (77.1 kg)  01/04/24 174 lb (78.9 kg)  12/03/23 173 lb (78.5 kg)   BP Readings from Last 3 Encounters:  02/29/24 128/72  01/04/24 118/80  12/03/23 (!) 128/96   Immunization History  Administered Date(s) Administered   Influenza,inj,Quad PF,6+ Mos 08/13/2015, 09/15/2017, 08/23/2018, 08/24/2019, 10/24/2020, 08/27/2022   Influenza-Unspecified 08/17/2011, 09/14/2012, 09/03/2014   PFIZER Comirnaty(Gray Top)Covid-19 Tri-Sucrose Vaccine 11/27/2020, 12/18/2020   Pneumococcal Polysaccharide-23 01/02/2015   Td 03/28/2010   Tdap 09/28/2013, 08/27/2022   Health Maintenance Due  Topic Date Due   Zoster Vaccines- Shingrix (1 of 2) Never done   Pneumococcal Vaccine 86-12 Years old (2 of 2 - PCV) 01/03/2016      Past Medical History:  Diagnosis Date   ALLERGIC RHINITIS    Anemia    ANXIETY    DEPRESSION    Encounter for chronic pain management 10/25/2020   Fibroid    HYPERLIPIDEMIA    diet controlled, no meds   Hypertension    Hypothyroidism    Lichen sclerosus of female genitalia 2018   Thyroid carcinoma (HCC)    Past Surgical History:  Procedure Laterality Date   ABDOMINAL  HYSTERECTOMY N/A 01/01/2015   Procedure:  SUPRACERVICAL HYSTERECTOMY WITH BILATERAL SALPINGECTOMT  Surgeon: Debbe Bales E Amundson de Gwenevere Ghazi, MD;  Location: [facility];  Service: Gynecology;  Laterality: N/A;   BILATERAL SALPINGECTOMY Bilateral 01/01/2015   Procedure: BILATERAL SALPINGECTOMY        ;  Surgeon: Forrestine Him Amundson de Gwenevere Ghazi, MD;  Location: [facility];  Service: Gynecology;  Laterality: Bilateral;   CESAREAN SECTION  2002   x 1   TOTAL THYROIDECTOMY  2009/2010   TUBAL LIGATION     WISDOM TOOTH EXTRACTION      reports that she quit smoking about a year ago. Her smoking use included cigarettes. She started smoking about 32 years ago. She has a 15.5 pack-year smoking history. She uses smokeless tobacco. She reports current alcohol use of about 6.0 standard drinks of alcohol per week. She reports that she does not use drugs. family history includes Breast cancer in her paternal grandmother; Breast cancer (age of onset: 24) in her mother; COPD in her father; Cancer in her maternal grandmother; Colon polyps in her father; Diabetes in her father; Heart failure in her father; Hypertension in an other family member; Kidney disease in her father; Stroke in an other family member. Allergies  Allergen Reactions   Doxycycline Nausea Only  Sulfonamide Derivatives Itching   Current Outpatient Medications on File Prior to Visit  Medication Sig Dispense Refill   Ascorbic Acid (VITAMIN C) 1000 MG tablet Take 1,000 mg by mouth daily.     betamethasone valerate ointment (VALISONE) 0.1 % Apply to vulva twice daily for 2 weeks prn. May use twice weekly for maintenance dose. 45 g 0   BIOTIN PO Take 1 tablet by mouth daily.      Cholecalciferol (VITAMIN D-3 PO) Take 1 capsule by mouth daily.     cyanocobalamin 1000 MCG tablet Take 1,000 mcg by mouth daily.     ezetimibe (ZETIA) 10 MG tablet Take 1 tablet (10 mg total) by mouth daily. 90 tablet 3   levothyroxine (SYNTHROID) 112 MCG tablet Take 1  tablet (112 mcg total) by mouth daily. 90 tablet 3   Multiple Vitamin (MULTIVITAMIN) capsule Take 1 capsule by mouth daily.     Omega-3 Fatty Acids (FISH OIL PO) Take 1 capsule by mouth daily.      omeprazole (PRILOSEC) 20 MG capsule Take 1 capsule (20 mg total) by mouth daily. 30 capsule 3   polyethylene glycol (MIRALAX) 17 g packet Take 34 g by mouth 2 (two) times daily. You may increase as needed 14 each 0   No current facility-administered medications on file prior to visit.        ROS:  All others reviewed and negative.  Objective        PE:  BP 128/72 (BP Location: Right Arm, Patient Position: Sitting, Cuff Size: Normal)   Pulse 75   Temp 98.1 F (36.7 C) (Oral)   Ht 5' 4.5" (1.638 m)   Wt 170 lb (77.1 kg)   LMP 12/05/2014 (Approximate)   SpO2 98%   BMI 28.73 kg/m                 Constitutional: Pt appears in NAD               HENT: Head: NCAT.                Right Ear: External ear normal.                 Left Ear: External ear normal.                Eyes: . Pupils are equal, round, and reactive to light. Conjunctivae and EOM are normal               Nose: without d/c or deformity               Neck: Neck supple. Gross normal ROM               Cardiovascular: Normal rate and regular rhythm.                 Pulmonary/Chest: Effort normal and breath sounds without rales or wheezing.                Abd:  Soft, NT, ND, + BS, no organomegaly               Neurological: Pt is alert. At baseline orientation, motor grossly intact               Skin: Skin is warm. No rashes, no other new lesions, LE edema - none               Psychiatric: Pt behavior is normal without agitation   Micro: none  Cardiac tracings I have personally interpreted today:  none  Pertinent Radiological findings (summarize): none   Lab Results  Component Value Date   WBC 8.1 02/29/2024   HGB 14.1 02/29/2024   HCT 41.7 02/29/2024   PLT 353.0 02/29/2024   GLUCOSE 87 02/29/2024   CHOL 125 02/29/2024    TRIG 72.0 02/29/2024   HDL 55.60 02/29/2024   LDLDIRECT 131.0 03/10/2007   LDLCALC 55 02/29/2024   ALT 18 02/29/2024   AST 20 02/29/2024   NA 136 02/29/2024   K 3.9 02/29/2024   CL 100 02/29/2024   CREATININE 0.66 02/29/2024   BUN 14 02/29/2024   CO2 29 02/29/2024   TSH 0.68 02/29/2024   HGBA1C 6.0 02/29/2024   MICROALBUR <0.7 12/25/2022   Assessment/Plan:  Tamberly S Lose is a 58 y.o. White or Caucasian [1] female with  has a past medical history of ALLERGIC RHINITIS, Anemia, ANXIETY, DEPRESSION, Encounter for chronic pain management (10/25/2020), Fibroid, HYPERLIPIDEMIA, Hypertension, Hypothyroidism, Lichen sclerosus of female genitalia (2018), and Thyroid carcinoma (HCC).  Encounter for well adult exam with abnormal findings Age and sex appropriate education and counseling updated with regular exercise and diet Referrals for preventative services - none needed Immunizations addressed - for prevnar 20 and shingrix at pharmacy Smoking counseling  - none needed Evidence for depression or other mood disorder - none significant Most recent labs reviewed. I have personally reviewed and have noted: 1) the patient's medical and social history 2) The patient's current medications and supplements 3) The patient's height, weight, and BMI have been recorded in the chart   Hyperlipidemia Lab Results  Component Value Date   LDLCALC 55 02/29/2024   Stable, pt to continue current statin crestor 20 mg every day, zetia 10 mg qd   Postsurgical hypothyroidism Lab Results  Component Value Date   TSH 0.68 02/29/2024   Stable, pt to continue levothyroxine 112 mcg qd   Vitamin D deficiency Last vitamin D Lab Results  Component Value Date   VD25OH 45.47 02/29/2024   Stable, cont oral replacement   Hyperglycemia Lab Results  Component Value Date   HGBA1C 6.0 02/29/2024   Stable, pt to continue current medical treatment  - diet, wt control   HTN (hypertension) BP  Readings from Last 3 Encounters:  02/29/24 128/72  01/04/24 118/80  12/03/23 (!) 128/96   Stable, pt to continue medical treatment losartan 100 mg qd   Bilateral hip pain ? Msk strain vs other - for bilateral hip and pelvis films,  to f/u any worsening symptoms or concerns  Followup: Return in about 1 year (around 02/28/2025).  Rosalia Colonel, MD 03/01/2024 8:11 PM [facility]

## 2024-03-01 ENCOUNTER — Encounter: Payer: Self-pay | Admitting: Internal Medicine

## 2024-03-01 LAB — URINALYSIS, ROUTINE W REFLEX MICROSCOPIC
Bilirubin Urine: NEGATIVE
Hgb urine dipstick: NEGATIVE
Ketones, ur: NEGATIVE
Leukocytes,Ua: NEGATIVE
Nitrite: NEGATIVE
RBC / HPF: NONE SEEN (ref 0–?)
Specific Gravity, Urine: 1.01 (ref 1.000–1.030)
Total Protein, Urine: NEGATIVE
Urine Glucose: NEGATIVE
Urobilinogen, UA: 0.2 (ref 0.0–1.0)
pH: 6 (ref 5.0–8.0)

## 2024-03-01 NOTE — Assessment & Plan Note (Signed)
 Lab Results  Component Value Date   HGBA1C 6.0 02/29/2024   Stable, pt to continue current medical treatment  - diet, wt control

## 2024-03-01 NOTE — Assessment & Plan Note (Signed)
 BP Readings from Last 3 Encounters:  02/29/24 128/72  01/04/24 118/80  12/03/23 (!) 128/96   Stable, pt to continue medical treatment losartan 100 mg qd

## 2024-03-01 NOTE — Assessment & Plan Note (Signed)
 Lab Results  Component Value Date   LDLCALC 55 02/29/2024   Stable, pt to continue current statin crestor 20 mg every day, zetia 10 mg qd

## 2024-03-01 NOTE — Assessment & Plan Note (Signed)
 Age and sex appropriate education and counseling updated with regular exercise and diet Referrals for preventative services - none needed Immunizations addressed - for prevnar 20 and shingrix at pharmacy Smoking counseling  - none needed Evidence for depression or other mood disorder - none significant Most recent labs reviewed. I have personally reviewed and have noted: 1) the patient's medical and social history 2) The patient's current medications and supplements 3) The patient's height, weight, and BMI have been recorded in the chart

## 2024-03-01 NOTE — Assessment & Plan Note (Signed)
 Last vitamin D Lab Results  Component Value Date   VD25OH 45.47 02/29/2024   Stable, cont oral replacement

## 2024-03-01 NOTE — Progress Notes (Signed)
 The test results show that your current treatment is OK, as the tests are stable.  Please continue the same plan.  There is no other need for change of treatment or further evaluation based on these results, at this time.  thanks

## 2024-03-01 NOTE — Assessment & Plan Note (Signed)
 Lab Results  Component Value Date   TSH 0.68 02/29/2024   Stable, pt to continue levothyroxine 112 mcg qd

## 2024-03-01 NOTE — Assessment & Plan Note (Signed)
?   Msk strain vs other - for bilateral hip and pelvis films,  to f/u any worsening symptoms or concerns

## 2024-03-14 ENCOUNTER — Encounter: Payer: Self-pay | Admitting: Internal Medicine

## 2024-03-16 DIAGNOSIS — L821 Other seborrheic keratosis: Secondary | ICD-10-CM | POA: Diagnosis not present

## 2024-03-16 DIAGNOSIS — D225 Melanocytic nevi of trunk: Secondary | ICD-10-CM | POA: Diagnosis not present

## 2024-03-16 DIAGNOSIS — L814 Other melanin hyperpigmentation: Secondary | ICD-10-CM | POA: Diagnosis not present

## 2024-03-16 DIAGNOSIS — D2271 Melanocytic nevi of right lower limb, including hip: Secondary | ICD-10-CM | POA: Diagnosis not present

## 2024-03-21 ENCOUNTER — Ambulatory Visit
Admission: RE | Admit: 2024-03-21 | Discharge: 2024-03-21 | Disposition: A | Discharge status: Home / Self Care | Source: Ambulatory Visit | Attending: Internal Medicine | Admitting: Internal Medicine

## 2024-03-21 DIAGNOSIS — Z1231 Encounter for screening mammogram for malignant neoplasm of breast: Secondary | ICD-10-CM | POA: Diagnosis not present

## 2024-03-24 ENCOUNTER — Encounter: Payer: Self-pay | Admitting: Obstetrics and Gynecology

## 2024-05-08 ENCOUNTER — Ambulatory Visit (INDEPENDENT_AMBULATORY_CARE_PROVIDER_SITE_OTHER)

## 2024-05-08 ENCOUNTER — Ambulatory Visit: Admitting: Internal Medicine

## 2024-05-08 ENCOUNTER — Ambulatory Visit: Payer: Self-pay | Admitting: Internal Medicine

## 2024-05-08 ENCOUNTER — Encounter: Payer: Self-pay | Admitting: Internal Medicine

## 2024-05-08 VITALS — BP 128/76 | HR 69 | Temp 97.7°F | Ht 64.5 in | Wt 168.0 lb

## 2024-05-08 DIAGNOSIS — R059 Cough, unspecified: Secondary | ICD-10-CM | POA: Diagnosis not present

## 2024-05-08 DIAGNOSIS — R739 Hyperglycemia, unspecified: Secondary | ICD-10-CM | POA: Diagnosis not present

## 2024-05-08 DIAGNOSIS — J320 Chronic maxillary sinusitis: Secondary | ICD-10-CM | POA: Diagnosis not present

## 2024-05-08 DIAGNOSIS — R051 Acute cough: Secondary | ICD-10-CM | POA: Diagnosis not present

## 2024-05-08 DIAGNOSIS — I10 Essential (primary) hypertension: Secondary | ICD-10-CM

## 2024-05-08 MED ORDER — METHYLPREDNISOLONE 4 MG PO TBPK: ORAL_TABLET | ORAL | 0 refills | Status: DC

## 2024-05-08 MED ORDER — CETIRIZINE HCL 10 MG PO TABS: 10.0000 mg | ORAL_TABLET | Freq: Every day | ORAL | 11 refills | Status: AC

## 2024-05-08 NOTE — Assessment & Plan Note (Signed)
 BP Readings from Last 3 Encounters:  05/08/24 128/76  02/29/24 128/72  01/04/24 118/80   Stable, pt to continue medical treatment losartan  100 mg qd

## 2024-05-08 NOTE — Progress Notes (Signed)
 Patient ID: [name], female   DOB: 12-13-1965, 58 y.o.   MRN: [id number]        Chief Complaint: follow up left maxillary pain swelling       HPI:  [name] is a 58 y.o. female here with c/o above, empirically tx with antibx for possible dental infection, pain improved but still vague swelling x 3 days to left maxilary area, but no overt nasal obstruction or drainage or blood.  Does have occasional cough, has switched from tobacco to vaping x 8 mo and asks for cxr today.  Pt denies chest pain, increased sob or doe, wheezing, orthopnea, PND, increased LE swelling, palpitations, dizziness or syncope.   Pt denies polydipsia, polyuria, or new focal neuro s/s.    Pt denies recent wt loss, night sweats, loss of appetite, or other constitutional symptoms         Wt Readings from Last 3 Encounters:  05/08/24 168 lb (76.2 kg)  02/29/24 170 lb (77.1 kg)  01/04/24 174 lb (78.9 kg)   BP Readings from Last 3 Encounters:  05/08/24 128/76  02/29/24 128/72  01/04/24 118/80         Past Medical History:  Diagnosis Date   ALLERGIC RHINITIS    Anemia    ANXIETY    DEPRESSION    Encounter for chronic pain management 10/25/2020   Fibroid    HYPERLIPIDEMIA    diet controlled, no meds   Hypertension    Hypothyroidism    Lichen sclerosus of female genitalia 2018   Thyroid  carcinoma [facility])    Past Surgical History:  Procedure Laterality Date   ABDOMINAL HYSTERECTOMY N/A 01/01/2015   Procedure:  SUPRACERVICAL HYSTERECTOMY WITH BILATERAL SALPINGECTOMT  Surgeon: Bobie BRAVO Amundson de Charlynn BRAVO Cary, MD;  Location: [facility];  Service: Gynecology;  Laterality: N/A;   BILATERAL SALPINGECTOMY Bilateral 01/01/2015   Procedure: BILATERAL SALPINGECTOMY        ;  Surgeon: Bobie BRAVO Amundson de Charlynn BRAVO Cary, MD;  Location: [facility];  Service: Gynecology;  Laterality: Bilateral;   CESAREAN SECTION  2002   x 1   TOTAL THYROIDECTOMY  2009/2010   TUBAL LIGATION     WISDOM TOOTH EXTRACTION       reports that she quit smoking about 14 months ago. Her smoking use included cigarettes. She started smoking about 32 years ago. She has a 15.5 pack-year smoking history. She uses smokeless tobacco. She reports current alcohol use of about 6.0 standard drinks of alcohol per week. She reports that she does not use drugs. family history includes Breast cancer in her paternal grandmother; Breast cancer (age of onset: 66) in her mother; COPD in her father; Cancer in her maternal grandmother; Colon polyps in her father; Diabetes in her father; Heart failure in her father; Hypertension in an other family member; Kidney disease in her father; Stroke in an other family member. Allergies  Allergen Reactions   Doxycycline  Nausea Only   Sulfonamide Derivatives Itching   Current Outpatient Medications on File Prior to Visit  Medication Sig Dispense Refill   ALPRAZolam  (XANAX ) 0.5 MG tablet Take 1 tablet (0.5 mg total) by mouth 2 (two) times daily as needed for anxiety. 60 tablet 2   Ascorbic Acid (VITAMIN C) 1000 MG tablet Take 1,000 mg by mouth daily.     betamethasone  valerate ointment (VALISONE ) 0.1 % Apply to vulva twice daily for 2 weeks prn. May use twice weekly for maintenance dose. 45 g 0  BIOTIN PO Take 1 tablet by mouth daily.      Cholecalciferol (VITAMIN D -3 PO) Take 1 capsule by mouth daily.     clindamycin (CLEOCIN) 300 MG capsule Take 300 mg by mouth every 12 (twelve) hours.     cyanocobalamin  1000 MCG tablet Take 1,000 mcg by mouth daily.     ezetimibe  (ZETIA ) 10 MG tablet Take 1 tablet (10 mg total) by mouth daily. 90 tablet 3   levothyroxine  (SYNTHROID ) 112 MCG tablet Take 1 tablet (112 mcg total) by mouth daily. 90 tablet 3   losartan  (COZAAR ) 100 MG tablet Take 1 tablet (100 mg total) by mouth daily. 90 tablet 3   Multiple Vitamin (MULTIVITAMIN) capsule Take 1 capsule by mouth daily.     Omega-3 Fatty Acids (FISH OIL PO) Take 1 capsule by mouth daily.      omeprazole  (PRILOSEC) 20 MG  capsule Take 1 capsule (20 mg total) by mouth daily. 30 capsule 3   polyethylene glycol (MIRALAX ) 17 g packet Take 34 g by mouth 2 (two) times daily. You may increase as needed 14 each 0   rosuvastatin  (CRESTOR ) 20 MG tablet Take 1 tablet (20 mg total) by mouth daily. 90 tablet 3   No current facility-administered medications on file prior to visit.        ROS:  All others reviewed and negative.  Objective        PE:  BP 128/76 (BP Location: Right Arm, Patient Position: Sitting, Cuff Size: Normal)   Pulse 69   Temp 97.7 F (36.5 C) (Oral)   Ht 5' 4.5 (1.638 m)   Wt 168 lb (76.2 kg)   LMP 12/05/2014 (Approximate)   SpO2 98%   BMI 28.39 kg/m                 Constitutional: Pt appears in NAD               HENT: Head: NCAT.                Right Ear: External ear normal.                 Left Ear: External ear normal. Bilat tm's with mild erythema.  Left Max sinus areas mild tender with overlying small swelling,.  Pharynx with mild erythema, no exudate               Eyes: . Pupils are equal, round, and reactive to light. Conjunctivae and EOM are normal               Nose: without d/c or deformity               Neck: Neck supple. Gross normal ROM               Cardiovascular: Normal rate and regular rhythm.                 Pulmonary/Chest: Effort normal and breath sounds without rales or wheezing.                               Neurological: Pt is alert. At baseline orientation, motor grossly intact               Skin: Skin is warm. No rashes, no other new lesions, LE edema - none               Psychiatric: Pt behavior is normal  without agitation   Micro: none  Cardiac tracings I have personally interpreted today:  none  Pertinent Radiological findings (summarize): none   Lab Results  Component Value Date   WBC 8.1 02/29/2024   HGB 14.1 02/29/2024   HCT 41.7 02/29/2024   PLT 353.0 02/29/2024   GLUCOSE 87 02/29/2024   CHOL 125 02/29/2024   TRIG 72.0 02/29/2024   HDL 55.60  02/29/2024   LDLDIRECT 131.0 03/10/2007   LDLCALC 55 02/29/2024   ALT 18 02/29/2024   AST 20 02/29/2024   NA 136 02/29/2024   K 3.9 02/29/2024   CL 100 02/29/2024   CREATININE 0.66 02/29/2024   BUN 14 02/29/2024   CO2 29 02/29/2024   TSH 0.68 02/29/2024   HGBA1C 6.0 02/29/2024   MICROALBUR <0.7 12/25/2022   Assessment/Plan:  Berline S Naef is a 58 y.o. White or Caucasian [1] female with  has a past medical history of ALLERGIC RHINITIS, Anemia, ANXIETY, DEPRESSION, Encounter for chronic pain management (10/25/2020), Fibroid, HYPERLIPIDEMIA, Hypertension, Hypothyroidism, Lichen sclerosus of female genitalia (2018), and Thyroid  carcinoma (HCC).  Cough With hx of recent cough and 8 months Vaping off tobacco  - for cxr  Left maxillary sinusitis Pt to conitnue antibx per dental, also for zyrtec  and medrol dose pack asd,  to f/u any worsening symptoms or concerns, consider CT sinus if not improved  Hyperglycemia Lab Results  Component Value Date   HGBA1C 6.0 02/29/2024   Stable, pt to continue current medical treatment  - diet, wt control    HTN (hypertension) BP Readings from Last 3 Encounters:  05/08/24 128/76  02/29/24 128/72  01/04/24 118/80   Stable, pt to continue medical treatment losartan  100 mg qd  Followup: Return if symptoms worsen or fail to improve.  Lynwood Rush, MD 05/08/2024 12:48 PM [facility] [place] [facility]

## 2024-05-08 NOTE — Patient Instructions (Addendum)
 Please to finish the antibiotic as per your dentist treatment  Please take all new medication as prescribed - the zyrtec , and medrol dose pack  If not better, we would consider a CT sinus  Please continue all other medications as before, and refills have been done if requested.  Please have the pharmacy call with any other refills you may need.  Please keep your appointments with your specialists as you may have planned  Please go to the XRAY Department in the first floor for the x-ray testing  You will be contacted by phone if any changes need to be made immediately.  Otherwise, you will receive a letter about your results with an explanation, but please check with MyChart first.

## 2024-05-08 NOTE — Assessment & Plan Note (Signed)
 With hx of recent cough and 8 months Vaping off tobacco  - for cxr

## 2024-05-08 NOTE — Assessment & Plan Note (Signed)
 Lab Results  Component Value Date   HGBA1C 6.0 02/29/2024   Stable, pt to continue current medical treatment  - diet, wt control

## 2024-05-08 NOTE — Assessment & Plan Note (Signed)
 Pt to conitnue antibx per dental, also for zyrtec  and medrol dose pack asd,  to f/u any worsening symptoms or concerns, consider CT sinus if not improved

## 2024-07-07 ENCOUNTER — Encounter: Payer: Self-pay | Admitting: Internal Medicine

## 2024-07-07 MED ORDER — CIPROFLOXACIN HCL 500 MG PO TABS: 500.0000 mg | ORAL_TABLET | Freq: Two times a day (BID) | ORAL | 0 refills | Status: AC

## 2024-08-24 ENCOUNTER — Other Ambulatory Visit: Payer: Self-pay | Admitting: Internal Medicine

## 2024-08-24 ENCOUNTER — Encounter: Payer: Self-pay | Admitting: Internal Medicine

## 2024-08-24 DIAGNOSIS — K869 Disease of pancreas, unspecified: Secondary | ICD-10-CM

## 2024-08-24 DIAGNOSIS — R1900 Intra-abdominal and pelvic swelling, mass and lump, unspecified site: Secondary | ICD-10-CM

## 2024-08-25 ENCOUNTER — Telehealth: Payer: Self-pay

## 2024-08-25 NOTE — Telephone Encounter (Signed)
 Copied from CRM [id number]. Topic: Clinical - Request for Lab/Test Order >> Aug 25, 2024  3:38 PM Viola F wrote: Reason for CRM: Geni from [facility] called to confirm that order for patient abdominal MRI is suppose to be MRCP? Please call Geni to clarify order at [phone] ext [phone]

## 2024-08-26 NOTE — Telephone Encounter (Signed)
 no

## 2024-08-28 NOTE — Telephone Encounter (Signed)
 Called and let Imaging know.

## 2024-09-05 ENCOUNTER — Ambulatory Visit (INDEPENDENT_AMBULATORY_CARE_PROVIDER_SITE_OTHER): Payer: BC Managed Care – PPO | Admitting: Internal Medicine

## 2024-09-05 ENCOUNTER — Encounter: Payer: Self-pay | Admitting: Internal Medicine

## 2024-09-05 VITALS — BP 120/70 | HR 77 | Ht 64.5 in | Wt 168.4 lb

## 2024-09-05 DIAGNOSIS — E890 Postprocedural hypothyroidism: Secondary | ICD-10-CM

## 2024-09-05 DIAGNOSIS — C73 Malignant neoplasm of thyroid gland: Secondary | ICD-10-CM

## 2024-09-05 NOTE — Progress Notes (Signed)
 Patient ID: [name], female   DOB: 1966/04/09, 58 y.o.   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  She previously saw Dr. Kassie, but last visit with me 1 year ago.  Interim history: She feels well at today's visit, without complaints other than hair thinning. She mentions she was able to lose approximately 10 pounds since last visit.  Reviewed cancer history: Pt. was dx with thyroid  cancer in 2012.   10/12: Total thyroidectomy (Dr. Ethyl): 1.7 cm left lobe PTC, with 2 pos nodes (T1b N1 M0):    10/12: RAI rx 158 mCi  09/25/2011: post-therapy WBS: pos at right neck, nodes vs remnant.   1.  Focal uptake within the thyroid  bed just right of midline.  Differential includes residual thyroid  tissue versus lymph node activity.  2. No evidence of distant metastasis.   Neck U/S (04/07/2023):  Surgical changes of total thyroidectomy without evidence of residual or recurrent thyroid  tissue, nodularity or lymphadenopathy.  Reviewed thyroglobulin and Tg antibodies: Lab Results  Component Value Date   THYROGLB 0.1 (L) 09/30/2023   THYROGLB 0.1 (L) 12/25/2022   THYROGLB 0.3 (L) 03/09/2022   THYROGLB 0.1 (L) 01/10/2021   THYROGLB 0.2 (L) 12/14/2019   THYROGLB 0.1 (L) 01/24/2019   THYROGLB 0.2 (L) 02/01/2017   THYROGLB 0.2 (L) 12/11/2015   THYROGLB 0.1 (L) 02/06/2015   THYROGLB <0.2 02/01/2014   THYROGLB <0.2 01/03/2013   THYROGLB 0.7 05/10/2012   THYROGLB 2.3 01/05/2012   Lab Results  Component Value Date   THGAB <1 09/30/2023   THGAB <1 12/25/2022   THGAB <1 03/09/2022   THGAB <1 01/10/2021   THGAB <1 12/14/2019   THGAB <1 01/24/2019   THGAB <1 02/01/2017   THGAB <1 12/11/2015   THGAB <1 02/06/2015   THGAB <20.0 02/01/2014   THGAB <20.0 01/03/2013   THGAB <20.0 01/05/2012   Pt denies: - feeling nodules in neck - hoarseness - dysphagia -  choking  Postsurgical hypothyroidism:  Pt is on levothyroxine  112 mcg daily, taken: - in am(4:30 am), with coffee - fasting - at least 30 min (up to 2 hours) from b'fast - no calcium  - no iron - + intermittent  multivitamins - no PPIs - on vitamin D , vitamin C and fish oil; hair vitamins - with Biotin - 2500 mcg - last dose 3 weeks ago.  I reviewed pt's thyroid  tests: Lab Results  Component Value Date   TSH 0.68 02/29/2024   TSH 0.77 09/30/2023   TSH 1.22 12/25/2022   TSH 1.46 02/23/2022   TSH 2.70 06/11/2021   TSH 0.95 01/10/2021   TSH 0.09 (L) 12/14/2019   TSH 0.08 (L) 09/25/2019   TSH 0.36 09/20/2018   TSH 0.41 11/01/2017   FREET4 1.03 09/30/2023   FREET4 1.14 01/10/2021   FREET4 1.40 12/14/2019   FREET4 1.14 09/20/2018   FREET4 1.17 11/01/2017   FREET4 1.14 08/13/2015   FREET4 1.56 05/04/2013    Pt denies: - fatigue - weight gain - cold intolerance - constipation  She has: - hair loss - dry skin  She has + FH of thyroid  disorders -  thyroid  cancer in sister, father. No h/o radiation tx to head or neck except for RAI treatment.  ROS: + see HPI  Past Medical History:  Diagnosis Date   ALLERGIC RHINITIS    Anemia    ANXIETY    DEPRESSION  Encounter for chronic pain management 10/25/2020   Fibroid    HYPERLIPIDEMIA    diet controlled, no meds   Hypertension    Hypothyroidism    Lichen sclerosus of female genitalia 2018   Thyroid  carcinoma [facility])    Past Surgical History:  Procedure Laterality Date   ABDOMINAL HYSTERECTOMY N/A 01/01/2015   Procedure:  SUPRACERVICAL HYSTERECTOMY WITH BILATERAL SALPINGECTOMT  Surgeon: Bobie FORBES Crown de Charlynn FORBES Cary, MD;  Location: [facility];  Service: Gynecology;  Laterality: N/A;   BILATERAL SALPINGECTOMY Bilateral 01/01/2015   Procedure: BILATERAL SALPINGECTOMY        ;  Surgeon: Bobie FORBES Amundson de Charlynn FORBES Cary, MD;  Location: [facility];  Service: Gynecology;  Laterality: Bilateral;   CESAREAN SECTION  2002    x 1   TOTAL THYROIDECTOMY  2009/2010   TUBAL LIGATION     WISDOM TOOTH EXTRACTION     Social History   Socioeconomic History   Marital status: Married    Spouse name: Not on file   Number of children: 1   Years of education: Not on file   Highest education level: 12th grade  Occupational History   Occupation: customer service  Tobacco Use   Smoking status: Former    Current packs/day: 0.00    Average packs/day: 0.5 packs/day for 31.0 years (15.5 ttl pk-yrs)    Types: Cigarettes    Start date: 02/21/1992    Quit date: 02/21/2023    Years since quitting: 1.5   Smokeless tobacco: Current   Tobacco comments:    Vape  Vaping Use   Vaping status: Former  Substance and Sexual Activity   Alcohol use: Yes    Alcohol/week: 6.0 standard drinks of alcohol    Types: 6 Cans of beer per week    Comment: 6 beers per week   Drug use: No   Sexual activity: Not Currently    Partners: Male    Birth control/protection: Surgical    Comment: tubal/TAH  Other Topics Concern   Not on file  Social History Narrative   Not on file   Social Drivers of Health   Financial Resource Strain: Low Risk  (02/29/2024)   Overall Financial Resource Strain (CARDIA)    Difficulty of Paying Living Expenses: Not hard at all  Food Insecurity: No Food Insecurity (02/29/2024)   Hunger Vital Sign    Worried About Running Out of Food in the Last Year: Never true    Ran Out of Food in the Last Year: Never true  Transportation Needs: No Transportation Needs (02/29/2024)   PRAPARE - Administrator, Civil Service (Medical): No    Lack of Transportation (Non-Medical): No  Physical Activity: Insufficiently Active (02/29/2024)   Exercise Vital Sign    Days of Exercise per Week: 4 days    Minutes of Exercise per Session: 10 min  Stress: No Stress Concern Present (02/29/2024)   [employer] of Occupational Health - Occupational Stress Questionnaire    Feeling of Stress : Only a little  Social  Connections: Socially Integrated (02/29/2024)   Social Connection and Isolation Panel    Frequency of Communication with Friends and Family: More than three times a week    Frequency of Social Gatherings with Friends and Family: Twice a week    Attends Religious Services: More than 4 times per year    Active Member of [employer] or Organizations: Yes    Attends Banker Meetings: More than 4 times per year  Marital Status: Married  Catering manager Violence: Not on file   Current Outpatient Medications on File Prior to Visit  Medication Sig Dispense Refill   ALPRAZolam  (XANAX ) 0.5 MG tablet Take 1 tablet (0.5 mg total) by mouth 2 (two) times daily as needed for anxiety. 60 tablet 2   Ascorbic Acid (VITAMIN C) 1000 MG tablet Take 1,000 mg by mouth daily.     betamethasone  valerate ointment (VALISONE ) 0.1 % Apply to vulva twice daily for 2 weeks prn. May use twice weekly for maintenance dose. 45 g 0   BIOTIN PO Take 1 tablet by mouth daily.      cetirizine  (ZYRTEC ) 10 MG tablet Take 1 tablet (10 mg total) by mouth daily. 30 tablet 11   Cholecalciferol (VITAMIN D -3 PO) Take 1 capsule by mouth daily.     cyanocobalamin  1000 MCG tablet Take 1,000 mcg by mouth daily.     ezetimibe  (ZETIA ) 10 MG tablet Take 1 tablet (10 mg total) by mouth daily. 90 tablet 3   levothyroxine  (SYNTHROID ) 112 MCG tablet Take 1 tablet (112 mcg total) by mouth daily. 90 tablet 3   losartan  (COZAAR ) 100 MG tablet Take 1 tablet (100 mg total) by mouth daily. 90 tablet 3   methylPREDNISolone  (MEDROL  DOSEPAK) 4 MG TBPK tablet 4 tab by mouth x 3 days, 2 tabs x 3 days, 1 tab x 3 days 21 tablet 0   Multiple Vitamin (MULTIVITAMIN) capsule Take 1 capsule by mouth daily.     Omega-3 Fatty Acids (FISH OIL PO) Take 1 capsule by mouth daily.      omeprazole  (PRILOSEC) 20 MG capsule Take 1 capsule (20 mg total) by mouth daily. 30 capsule 3   polyethylene glycol (MIRALAX ) 17 g packet Take 34 g by mouth 2 (two) times daily. You  may increase as needed 14 each 0   rosuvastatin  (CRESTOR ) 20 MG tablet Take 1 tablet (20 mg total) by mouth daily. 90 tablet 3   No current facility-administered medications on file prior to visit.   Allergies  Allergen Reactions   Doxycycline  Nausea Only   Sulfonamide Derivatives Itching   Family History  Problem Relation Age of Onset   Breast cancer Mother 74   Colon polyps Father    Diabetes Father    COPD Father    Heart failure Father    Kidney disease Father    Cancer Maternal Grandmother    Breast cancer Paternal Grandmother    Hypertension Other    Stroke Other    Colon cancer Neg Hx    Esophageal cancer Neg Hx    Stomach cancer Neg Hx    Rectal cancer Neg Hx    PE: BP 120/70   Pulse 77   Ht 5' 4.5 (1.638 m)   Wt 168 lb 6.4 oz (76.4 kg)   LMP 12/05/2014 (Approximate)   SpO2 96%   BMI 28.46 kg/m  Wt Readings from Last 3 Encounters:  09/05/24 168 lb 6.4 oz (76.4 kg)  05/08/24 168 lb (76.2 kg)  02/29/24 170 lb (77.1 kg)   Constitutional: overweight, in NAD Eyes:  EOMI, no exophthalmos ENT: no neck masses, thyroidectomy scar healed, some scar tissue palpated in the thyroidectomy bed, no cervical lymphadenopathy Cardiovascular:  RRR, No MRG Respiratory: CTA B Musculoskeletal: no deformities Skin:no rashes Neurological: no tremor with outstretched hands  ASSESSMENT: 1.  Papillary thyroid  cancer - see HPI  2. Postsurgical Hypothyroidism  PLAN:  1.  Papillary thyroid  cancer -Patient has a history of stage  I TNM ThyCA (due to young age at diagnosis and lack of distant metastases), but likely intermediate risk due to presence of positive lymph nodes -She had RAI treatment in 2012 and we discussed that the main role of this is to facilitate monitoring in the long run by checking thyroglobulin.  Also, it could have ablated possible metastasis sites. - Her thyroglobulin levels are not undetectable, however, they are at the lower detection point.  At last visit,  thyroglobulin was 0.1, stable.  She did previously had a higher thyroglobulin, at 0.3.  ATA antibodies are usually undetectable for her. - We checked a neck ultrasound in 03/2023, the first neck imaging she had in 12 years.  This was negative for any suspicious masses -At today's visit, she has no neck compression symptoms.  Will recheck her thyroglobulin and ATA antibodies and we will have a low threshold for a whole-body scan if her thyroglobulin level is higher - Plan to see her back in a year  2.  Patient with history of total thyroidectomy for thyroid  cancer, now with iatrogenic hypothyroidism, on levothyroxine  therapy - latest thyroid  labs reviewed with pt. >> normal: Lab Results  Component Value Date   TSH 0.68 02/29/2024  - she continues on LT4 112 mcg daily - pt feels good on this dose except for chronic hair loss - we discussed about taking the thyroid  hormone every day, with water , >30 minutes before breakfast, separated by >4 hours from acid reflux medications, calcium , iron, multivitamins. Pt. is taking it correctly. - will check thyroid  tests today: TSH and fT4 - If labs are abnormal, she will need to return for repeat TFTs in 1.5 months  Orders Placed This Encounter  Procedures   TSH   T4, free   Thyroglobulin Level   Thyroglobulin antibody   Needs refills for a year.  Lela Fendt, MD PhD [facility]

## 2024-09-05 NOTE — Patient Instructions (Signed)
 Please continue  Levothyroxine  112 mcg daily.  Take the thyroid  hormone every day, with water , at least 30 minutes before breakfast, separated by at least 4 hours from: - acid reflux medications - calcium  - iron - multivitamins  Please stop at the lab.  Please return to the clinic for another visit in 1 year.

## 2024-09-06 ENCOUNTER — Ambulatory Visit: Payer: BC Managed Care – PPO | Admitting: Obstetrics and Gynecology

## 2024-09-06 ENCOUNTER — Ambulatory Visit: Payer: Self-pay | Admitting: Internal Medicine

## 2024-09-06 ENCOUNTER — Other Ambulatory Visit: Payer: Self-pay | Admitting: Internal Medicine

## 2024-09-06 MED ORDER — LEVOTHYROXINE SODIUM 112 MCG PO TABS: 112.0000 ug | ORAL_TABLET | Freq: Every day | ORAL | 3 refills | Status: DC

## 2024-09-07 LAB — THYROGLOBULIN LEVEL: Thyroglobulin: 0.1 ng/mL — ABNORMAL LOW

## 2024-09-07 LAB — TSH: TSH: 0.41 m[IU]/L (ref 0.40–4.50)

## 2024-09-07 LAB — THYROGLOBULIN ANTIBODY: Thyroglobulin Ab: <1 [IU]/mL (ref <=1)

## 2024-09-07 LAB — T4, FREE: Free T4: 1.6 ng/dL (ref 0.8–1.8)

## 2024-09-13 ENCOUNTER — Ambulatory Visit: Payer: BC Managed Care – PPO | Admitting: Obstetrics and Gynecology

## 2024-09-13 ENCOUNTER — Encounter: Payer: Self-pay | Admitting: Obstetrics and Gynecology

## 2024-09-13 VITALS — BP 118/84 | HR 97 | Ht 65.5 in | Wt 169.0 lb

## 2024-09-13 DIAGNOSIS — Z01419 Encounter for gynecological examination (general) (routine) without abnormal findings: Secondary | ICD-10-CM

## 2024-09-13 DIAGNOSIS — N9089 Other specified noninflammatory disorders of vulva and perineum: Secondary | ICD-10-CM

## 2024-09-13 DIAGNOSIS — Z1331 Encounter for screening for depression: Secondary | ICD-10-CM

## 2024-09-13 MED ORDER — BETAMETHASONE VALERATE 0.1 % EX OINT: TOPICAL_OINTMENT | CUTANEOUS | 2 refills | Status: AC

## 2024-09-13 NOTE — Patient Instructions (Addendum)
 Itchy, Painful, White Skin Patches (Lichen Sclerosus): What to Know Lichen sclerosus is a skin problem. It can happen on any part of your body. It happens most often in the areas around your genitals or the opening of your butt (anus). Treatment can help to control symptoms. It can also help prevent scarring that may lead to other problems. This skin problem isn't an infection or a fungus. It can't be passed from person to person. What are the causes? The cause of this condition isn't known. It may be related to: The body's defense system, also called the immune system, reacting too strongly. A lack of certain hormones. What increases the risk? Being a female who has stopped having periods. This is called menopause. Being a female who wasn't circumcised. Being a child who hasn't reached puberty yet. What are the signs or symptoms?  Thin, wrinkled, white areas on the skin. Thickened white areas on the skin. Red and swollen patches on the skin. Tears or cracks in the skin. Bruising. Blood blisters. Very bad itching. Pain, itching, or burning when peeing. Trouble pooping, especially in children. How is this treated? This condition may be treated with: Topical steroids. These are creams or ointments that are put on the skin. Medicines that are taken by mouth. Topical immunotherapy. These are creams and ointments that you put on the skin to give your body's defense system a boost. Surgery. This may need to be done if there are problems like scarring. Follow these instructions at home: Medicines Take or apply your medicines only as told. Use creams or ointments as told by your doctor. Skin care Do not scratch the affected areas. Keep the affected areas clean and dry. Clean the affected area gently with water only. Avoid using rough towels or toilet paper. Avoid products that irritate the skin. These include scented soaps, lotions, and bubble bath. Use thick creams or ointments to lessen  itching as told. General instructions Your condition may cause trouble pooping. To help prevent or treat this, you may need to: Take medicines to help you poop. Eat foods high in fiber, like beans, whole grains, and fresh fruits and vegetables. Drink more fluids as told. Keep all follow-up visits. This helps make sure the treatment plan is working. Contact a doctor if: You have more redness, swelling, or pain. You have fluid, blood, or pus coming from the area. You have new patches on your skin. You have a fever. You have pain during sex. This information is not intended to replace advice given to you by your health care provider. Make sure you discuss any questions you have with your health care provider. Document Revised: 06/08/2023 Document Reviewed: 06/08/2023 Elsevier Patient Education  2024 Elsevier Inc.  EXERCISE AND DIET:  We recommended that you start or continue a regular exercise program for good health. Regular exercise means any activity that makes your heart beat faster and makes you sweat.  We recommend exercising at least 30 minutes per day at least 3 days a week, preferably 4 or 5.  We also recommend a diet low in fat and sugar.  Inactivity, poor dietary choices and obesity can cause diabetes, heart attack, stroke, and kidney damage, among others.    ALCOHOL AND SMOKING:  Women should limit their alcohol intake to no more than 7 drinks/beers/glasses of wine (combined, not each!) per week. Moderation of alcohol intake to this level decreases your risk of breast cancer and liver damage. And of course, no recreational drugs are part of  a healthy lifestyle.  And absolutely no smoking or even second hand smoke. Most people know smoking can cause heart and lung diseases, but did you know it also contributes to weakening of your bones? Aging of your skin?  Yellowing of your teeth and nails?  CALCIUM AND VITAMIN D:  Adequate intake of calcium and Vitamin D are recommended.  The  recommendations for exact amounts of these supplements seem to change often, but generally speaking 600 mg of calcium (either carbonate or citrate) and 800 units of Vitamin D per day seems prudent. Certain women may benefit from higher intake of Vitamin D.  If you are among these women, your doctor will have told you during your visit.    PAP SMEARS:  Pap smears, to check for cervical cancer or precancers,  have traditionally been done yearly, although recent scientific advances have shown that most women can have pap smears less often.  However, every woman still should have a physical exam from her gynecologist every year. It will include a breast check, inspection of the vulva and vagina to check for abnormal growths or skin changes, a visual exam of the cervix, and then an exam to evaluate the size and shape of the uterus and ovaries.  And after 57 years of age, a rectal exam is indicated to check for rectal cancers. We will also provide age appropriate advice regarding health maintenance, like when you should have certain vaccines, screening for sexually transmitted diseases, bone density testing, colonoscopy, mammograms, etc.   MAMMOGRAMS:  All women over 53 years old should have a yearly mammogram. Many facilities now offer a "3D" mammogram, which may cost around $50 extra out of pocket. If possible,  we recommend you accept the option to have the 3D mammogram performed.  It both reduces the number of women who will be called back for extra views which then turn out to be normal, and it is better than the routine mammogram at detecting truly abnormal areas.    COLONOSCOPY:  Colonoscopy to screen for colon cancer is recommended for all women at age 65.  We know, you hate the idea of the prep.  We agree, BUT, having colon cancer and not knowing it is worse!!  Colon cancer so often starts as a polyp that can be seen and removed at colonscopy, which can quite literally save your life!  And if your first  colonoscopy is normal and you have no family history of colon cancer, most women don't have to have it again for 10 years.  Once every ten years, you can do something that may end up saving your life, right?  We will be happy to help you get it scheduled when you are ready.  Be sure to check your insurance coverage so you understand how much it will cost.  It may be covered as a preventative service at no cost, but you should check your particular policy.

## 2024-09-13 NOTE — Progress Notes (Unsigned)
 58 y.o. G66P1001 Married Caucasian female here for annual exam.    Has lichen sclerosus.   Calcium  500 mg with vit D once daily.   Takes cranberry pill daily for bladder infection prevention.  Has seen Dr. Elisabeth of urology.  Followed for a liver and pancreatic cyst with periodic MRI. Next study 10-26-2024.   Father passed away in 2024-08-18.  Son marrying in [place] next year.  Husband is having 2 back surgeries.   PCP: Norleen Lynwood ORN, MD   Patient's last menstrual period was 12/05/2014 (approximate).           Sexually active: No.  The current method of family planning is status post hysterectomy.    Menopausal hormone therapy:  n/a Exercising: Yes.    Walking and weight lifting  Smoker:  Former   OB History  Gravida Para Term Preterm AB Living  1 1 1   1   SAB IAB Ectopic Multiple Live Births      1    # Outcome Date GA Lbr Len/2nd Weight Sex Type Anes PTL Lv  1 Term 05/06/13   8 lb 5 oz (3.771 kg) M CS-Unspec   LIV     HEALTH MAINTENANCE: Last 2 paps:  08/31/23 neg HR HPV neg, 01/02/19 neg HPV neg  History of abnormal Pap or positive HPV:  yes, pap 09/28/13 - ASCUS and positive HR HPV, Colpo 10/25/13 - ECC Benign.  Pap 10/01/14 - ASCUS, neg HR HPV. Pap 04/11/15 - ASCUS and neg HR HPV.  Mammogram:   03/21/24 Breast Density Cat B, BIRADS Cat 1 neg  Colonoscopy:  09/23/21 - polyps - recommended follow up in 7 years.   Bone Density:  n/a  Result  n/a   Immunization History  Administered Date(s) Administered   Influenza,inj,Quad PF,6+ Mos 08/13/2015, 09/15/2017, 08/23/2018, 08/24/2019, 10/24/2020, 08/27/2022   Influenza-Unspecified 08/17/2011, 09/14/2012, 09/03/2014   PFIZER Comirnaty(Gray Top)Covid-19 Tri-Sucrose Vaccine 11/27/2020, 12/18/2020   Pneumococcal Polysaccharide-23 01/02/2015   Td 03/28/2010   Tdap 09/28/2013, 08/27/2022      reports that she quit smoking about 18 months ago. Her smoking use included cigarettes. She started smoking about 32 years ago. She  has a 15.5 pack-year smoking history. She uses smokeless tobacco. She reports current alcohol use of about 6.0 standard drinks of alcohol per week. She reports that she does not use drugs.  Past Medical History:  Diagnosis Date   ALLERGIC RHINITIS    Anemia    ANXIETY    DEPRESSION    Encounter for chronic pain management 10/25/2020   Fibroid    HYPERLIPIDEMIA    diet controlled, no meds   Hypertension    Hypothyroidism    Lichen sclerosus of female genitalia 2018   Thyroid  carcinoma [facility])     Past Surgical History:  Procedure Laterality Date   ABDOMINAL HYSTERECTOMY N/A 01/01/2015   Procedure:  SUPRACERVICAL HYSTERECTOMY WITH BILATERAL SALPINGECTOMT  Surgeon: Bobie FORBES Crown de Charlynn FORBES Cary, MD;  Location: [facility];  Service: Gynecology;  Laterality: N/A;   BILATERAL SALPINGECTOMY Bilateral 01/01/2015   Procedure: BILATERAL SALPINGECTOMY        ;  Surgeon: Bobie FORBES Amundson de Charlynn FORBES Cary, MD;  Location: [facility];  Service: Gynecology;  Laterality: Bilateral;   CESAREAN SECTION  2002   x 1   TOTAL THYROIDECTOMY  2003-Oct-2010   TUBAL LIGATION     WISDOM TOOTH EXTRACTION      Current Outpatient Medications  Medication Sig Dispense Refill  Ascorbic Acid (VITAMIN C) 1000 MG tablet Take 1,000 mg by mouth daily.     betamethasone  valerate ointment (VALISONE ) 0.1 % Apply to vulva twice daily for 2 weeks prn. May use twice weekly for maintenance dose. 45 g 0   BIOTIN PO Take 1 tablet by mouth daily.      cetirizine  (ZYRTEC ) 10 MG tablet Take 1 tablet (10 mg total) by mouth daily. 30 tablet 11   Cholecalciferol (VITAMIN D -3 PO) Take 1 capsule by mouth daily.     cyanocobalamin  1000 MCG tablet Take 1,000 mcg by mouth daily.     ezetimibe  (ZETIA ) 10 MG tablet Take 1 tablet (10 mg total) by mouth daily. 90 tablet 3   levothyroxine  (SYNTHROID ) 112 MCG tablet Take 1 tablet (112 mcg total) by mouth daily. 90 tablet 3   losartan  (COZAAR ) 100 MG tablet Take 1 tablet (100 mg total) by mouth  daily. 90 tablet 3   Multiple Vitamin (MULTIVITAMIN) capsule Take 1 capsule by mouth daily.     Omega-3 Fatty Acids (FISH OIL PO) Take 1 capsule by mouth daily.      polyethylene glycol (MIRALAX ) 17 g packet Take 34 g by mouth 2 (two) times daily. You may increase as needed 14 each 0   rosuvastatin  (CRESTOR ) 20 MG tablet Take 1 tablet (20 mg total) by mouth daily. 90 tablet 3   omeprazole  (PRILOSEC) 20 MG capsule Take 1 capsule (20 mg total) by mouth daily. (Patient not taking: Reported on 09/13/2024) 30 capsule 3   No current facility-administered medications for this visit.    ALLERGIES: Doxycycline  and Sulfonamide derivatives  Family History  Problem Relation Age of Onset   Breast cancer Mother 65   Colon polyps Father    Diabetes Father    COPD Father    Heart failure Father    Kidney disease Father    Cancer Maternal Grandmother    Breast cancer Paternal Grandmother    Hypertension Other    Stroke Other    Colon cancer Neg Hx    Esophageal cancer Neg Hx    Stomach cancer Neg Hx    Rectal cancer Neg Hx     Review of Systems  All other systems reviewed and are negative.   PHYSICAL EXAM:  BP 118/84 (BP Location: Left Arm, Patient Position: Sitting)   Pulse 97   Ht 5' 5.5 (1.664 m)   Wt 169 lb (76.7 kg)   LMP 12/05/2014 (Approximate)   SpO2 98%   BMI 27.70 kg/m     General appearance: alert, cooperative and appears stated age Head: normocephalic, without obvious abnormality, atraumatic Neck: no adenopathy, supple, symmetrical, trachea midline and thyroid  normal to inspection and palpation Lungs: clear to auscultation bilaterally Breasts: normal appearance, no masses or tenderness, No nipple retraction or dimpling, No nipple discharge or bleeding, No axillary adenopathy Heart: regular rate and rhythm Abdomen: soft, non-tender; no masses, no organomegaly Extremities: extremities normal, atraumatic, no cyanosis or edema Skin: skin color, texture, turgor normal. No  rashes or lesions Lymph nodes: cervical, supraclavicular, and axillary nodes normal. Neurologic: grossly normal  Pelvic: External genitalia: hypopigmentation of the vulva, ecchymoses of the right labia minora, superior perianal region with thickened white tissue.                No abnormal inguinal nodes palpated.              Urethra:  normal appearing urethra with no masses, tenderness or lesions  Bartholins and Skenes: normal                 Vagina: normal appearing vagina with normal color and discharge, no lesions.  Atrophy noted.               Cervix: no lesions              Pap taken: no Bimanual Exam:  Uterus:  absent              Adnexa: no mass, fullness, tenderness              Rectal exam: yes.  Confirms.              Anus:  normal sphincter tone, no lesions  Chaperone was present for exam:  Kari HERO, CMA  ASSESSMENT: Well woman visit with gynecologic exam. Hx bladder cyst versus septum seen on US  in December 2022. Had supracervical hysterectomy 01/01/15. 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  Hx recurrent ASCUS paps.  Quit tobacco.  Lichen sclerosus.  Hx thyroid  cancer.  Hx liver and pancreatic cysts. FH breast cancer in mother.  PHQ-2-9: 0  PLAN: Mammogram screening discussed. Self breast awareness reviewed. Pap and HRV collected:  no, due in 2029. Guidelines for Calcium , Vitamin D , regular exercise program including cardiovascular and weight bearing exercise. Medication refills:  Valisone  twice daily for 6 weeks and then office visit for reassessment and possible biopsy.  Lichen sclerosus discussed.  We reviewed association with vulvar precancer and cancer.  We discussed potential vaginal estrogen cream for reducing risk of bladder infections.  I discussed potential effect on breast cancer.  She currently declines this.  Labs with PCP. Follow up:  yearly and prn.

## 2024-09-15 ENCOUNTER — Telehealth: Payer: Self-pay | Admitting: Gastroenterology

## 2024-09-15 NOTE — Telephone Encounter (Signed)
 Her last exam was in 09/2021, diminutive polyps.  Per national guidelines standard recommendation would be 7 year follow up exam for this.  If she is anxious and wants to do it a bit sooner at 5 years I can do that if it is her preference, you can change the recall, if insurance will cover it at that time.

## 2024-09-15 NOTE — Telephone Encounter (Signed)
 See note below and advise.

## 2024-09-15 NOTE — Telephone Encounter (Signed)
 Inbound call from patient stating she would like to have her colonoscopy 5 years instead of 7 due to PCP recommendations. Patient would like to know if its okay to switch recall. Please advise  Thank you

## 2024-09-15 NOTE — Telephone Encounter (Signed)
 Pt aware and recall changed to 5 years.

## 2024-09-20 ENCOUNTER — Telehealth: Payer: Self-pay

## 2024-09-20 NOTE — Telephone Encounter (Signed)
 Copied from CRM #[id number]. Topic: Appointments - Scheduling Inquiry for Clinic >> Sep 20, 2024 10:53 AM Harlene ORN wrote: Reason for CRM: Patient called to reschedule her physical with Dr. Lynwood Rush that was scheduled for 03/01/2025. Unable to find any availability for April next year. Please call back to schedule the patient when an opening's available.

## 2024-09-26 ENCOUNTER — Other Ambulatory Visit

## 2024-09-28 ENCOUNTER — Encounter: Payer: Self-pay | Admitting: Internal Medicine

## 2024-09-28 NOTE — Telephone Encounter (Signed)
Needs ROV please 

## 2024-09-29 ENCOUNTER — Encounter: Payer: Self-pay | Admitting: Internal Medicine

## 2024-10-03 ENCOUNTER — Ambulatory Visit: Admitting: Internal Medicine

## 2024-10-05 ENCOUNTER — Ambulatory Visit: Admitting: Emergency Medicine

## 2024-10-19 ENCOUNTER — Ambulatory Visit
Admission: RE | Admit: 2024-10-19 | Discharge: 2024-10-19 | Disposition: A | Source: Ambulatory Visit | Attending: Internal Medicine | Admitting: Internal Medicine

## 2024-10-19 ENCOUNTER — Ambulatory Visit: Payer: Self-pay | Admitting: Internal Medicine

## 2024-10-19 DIAGNOSIS — K7689 Other specified diseases of liver: Secondary | ICD-10-CM | POA: Diagnosis not present

## 2024-10-19 DIAGNOSIS — R1900 Intra-abdominal and pelvic swelling, mass and lump, unspecified site: Secondary | ICD-10-CM

## 2024-10-19 MED ORDER — GADOPICLENOL 0.5 MMOL/ML IV SOLN
7.5000 mL | Freq: Once | INTRAVENOUS | Status: AC | PRN
  Administered 2024-10-19: 7.5 mL via INTRAVENOUS

## 2024-10-20 ENCOUNTER — Other Ambulatory Visit: Payer: Self-pay | Admitting: Cardiovascular Disease

## 2024-10-20 ENCOUNTER — Telehealth: Payer: Self-pay | Admitting: Gastroenterology

## 2024-10-20 NOTE — Telephone Encounter (Signed)
 See note below

## 2024-10-20 NOTE — Telephone Encounter (Signed)
 Inbound call from patient stating she had her yearly MRI done and would like Dr.Armbruster to review results to see if anything has came up  Please advise  Thank you

## 2024-10-21 NOTE — Telephone Encounter (Signed)
 I had a chance to look at this - MRI shows stable changes of a pancreatic cyst. It has been stable now over the past year, does not appear to show any interval enlargement over time which is reassuring. I think a repeat MRCP in 1-2 years is reasonable to continue to survey it. I would like to see her in the office in one year to discuss it further, can you please place a recall office visit in one year and recall MRCP in one year. Thanks

## 2024-10-23 ENCOUNTER — Ambulatory Visit: Admitting: Internal Medicine

## 2024-10-23 ENCOUNTER — Telehealth: Payer: Self-pay | Admitting: Internal Medicine

## 2024-10-23 NOTE — Telephone Encounter (Signed)
 Reminder in epic for repeat MRCP in 1 year. Recall entered in epic for repeat OV in 1 year with Dr. Leigh. Pt notified via mychart.

## 2024-10-23 NOTE — Telephone Encounter (Signed)
 Copied from CRM #[id number]. Topic: Clinical - Refused Triage >> Oct 23, 2024 11:54 AM [name] wrote: Patient/caller voiced complaints of shooting pain up the left side of the outside of her foot when she walks for 1-2 months. Declined transfer to triage or sooner appt, currently scheduled for 12/30.

## 2024-10-23 NOTE — Telephone Encounter (Signed)
 This sounds like could be tendonitis -   Ok to make ROV to confirm, or maybe better would be to see Sports Medicine on the first floor as well (pt can call for appt)

## 2024-10-25 ENCOUNTER — Ambulatory Visit: Admitting: Obstetrics and Gynecology

## 2024-10-27 NOTE — Telephone Encounter (Unsigned)
 Copied from CRM #[id number]. Topic: General - Other >> Oct 27, 2024  2:17 PM Jasmin G wrote: Reason for CRM: Pt called regarding missed call from Ms. Puckett, [place] B, pt disconnected while waiting for response. Please call pt back at [phone].

## 2024-10-27 NOTE — Telephone Encounter (Signed)
 Called patient was unable get through a message was left to give us  a call back

## 2024-11-01 NOTE — Telephone Encounter (Signed)
 Pt has appt scheduled.

## 2024-11-14 ENCOUNTER — Ambulatory Visit: Payer: Self-pay | Admitting: Internal Medicine

## 2024-11-14 ENCOUNTER — Ambulatory Visit

## 2024-11-14 ENCOUNTER — Encounter: Payer: Self-pay | Admitting: Internal Medicine

## 2024-11-14 ENCOUNTER — Ambulatory Visit: Admitting: Internal Medicine

## 2024-11-14 VITALS — BP 122/80 | HR 75 | Temp 97.8°F | Ht 65.5 in | Wt 171.0 lb

## 2024-11-14 DIAGNOSIS — K5909 Other constipation: Secondary | ICD-10-CM | POA: Diagnosis not present

## 2024-11-14 DIAGNOSIS — I10 Essential (primary) hypertension: Secondary | ICD-10-CM | POA: Diagnosis not present

## 2024-11-14 DIAGNOSIS — E7800 Pure hypercholesterolemia, unspecified: Secondary | ICD-10-CM

## 2024-11-14 DIAGNOSIS — M79672 Pain in left foot: Secondary | ICD-10-CM | POA: Insufficient documentation

## 2024-11-14 DIAGNOSIS — R739 Hyperglycemia, unspecified: Secondary | ICD-10-CM | POA: Diagnosis not present

## 2024-11-14 DIAGNOSIS — E559 Vitamin D deficiency, unspecified: Secondary | ICD-10-CM | POA: Diagnosis not present

## 2024-11-14 LAB — BASIC METABOLIC PANEL WITH GFR
BUN: 15 mg/dL (ref 6–23)
CO2: 30 meq/L (ref 19–32)
Calcium: 9.7 mg/dL (ref 8.4–10.5)
Chloride: 101 meq/L (ref 96–112)
Creatinine, Ser: 0.56 mg/dL (ref 0.40–1.20)
GFR: 101.5 mL/min
Glucose, Bld: 95 mg/dL (ref 70–99)
Potassium: 3.8 meq/L (ref 3.5–5.1)
Sodium: 139 meq/L (ref 135–145)

## 2024-11-14 LAB — TSH: TSH: 0.75 u[IU]/mL (ref 0.35–5.50)

## 2024-11-14 LAB — CBC WITH DIFFERENTIAL/PLATELET
Basophils Absolute: 0.1 K/uL (ref 0.0–0.1)
Basophils Relative: 0.6 % (ref 0.0–3.0)
Eosinophils Absolute: 0.1 K/uL (ref 0.0–0.7)
Eosinophils Relative: 0.9 % (ref 0.0–5.0)
HCT: 42.7 % (ref 36.0–46.0)
Hemoglobin: 14.4 g/dL (ref 12.0–15.0)
Lymphocytes Relative: 34.1 % (ref 12.0–46.0)
Lymphs Abs: 2.9 K/uL (ref 0.7–4.0)
MCHC: 33.8 g/dL (ref 30.0–36.0)
MCV: 91.7 fl (ref 78.0–100.0)
Monocytes Absolute: 0.6 K/uL (ref 0.1–1.0)
Monocytes Relative: 7 % (ref 3.0–12.0)
Neutro Abs: 4.8 K/uL (ref 1.4–7.7)
Neutrophils Relative %: 57.4 % (ref 43.0–77.0)
Platelets: 315 K/uL (ref 150.0–400.0)
RBC: 4.66 Mil/uL (ref 3.87–5.11)
RDW: 12.8 % (ref 11.5–15.5)
WBC: 8.4 K/uL (ref 4.0–10.5)

## 2024-11-14 LAB — LIPID PANEL
Cholesterol: 129 mg/dL (ref 28–200)
HDL: 59.2 mg/dL
LDL Cholesterol: 51 mg/dL (ref 10–99)
NonHDL: 69.35
Total CHOL/HDL Ratio: 2
Triglycerides: 94 mg/dL (ref 10.0–149.0)
VLDL: 18.8 mg/dL (ref 0.0–40.0)

## 2024-11-14 LAB — HEPATIC FUNCTION PANEL
ALT: 38 U/L — ABNORMAL HIGH (ref 3–35)
AST: 29 U/L (ref 5–37)
Albumin: 4.4 g/dL (ref 3.5–5.2)
Alkaline Phosphatase: 71 U/L (ref 39–117)
Bilirubin, Direct: 0.1 mg/dL (ref 0.1–0.3)
Total Bilirubin: 0.4 mg/dL (ref 0.2–1.2)
Total Protein: 7.8 g/dL (ref 6.0–8.3)

## 2024-11-14 LAB — VITAMIN D 25 HYDROXY (VIT D DEFICIENCY, FRACTURES): VITD: 36.9 ng/mL (ref 30.00–100.00)

## 2024-11-14 LAB — HEMOGLOBIN A1C: Hgb A1c MFr Bld: 6.1 % (ref 4.6–6.5)

## 2024-11-14 MED ORDER — EZETIMIBE 10 MG PO TABS: 10.0000 mg | ORAL_TABLET | Freq: Every day | ORAL | 3 refills | Status: AC

## 2024-11-14 MED ORDER — ROSUVASTATIN CALCIUM 20 MG PO TABS: 20.0000 mg | ORAL_TABLET | Freq: Every day | ORAL | 3 refills | Status: AC

## 2024-11-14 MED ORDER — LOSARTAN POTASSIUM 100 MG PO TABS: 100.0000 mg | ORAL_TABLET | Freq: Every day | ORAL | 3 refills | Status: AC

## 2024-11-14 MED ORDER — LEVOTHYROXINE SODIUM 112 MCG PO TABS: 112.0000 ug | ORAL_TABLET | Freq: Every day | ORAL | 3 refills | Status: AC

## 2024-11-14 NOTE — Assessment & Plan Note (Signed)
 Lab Results  Component Value Date   HGBA1C 6.0 02/29/2024   Stable, pt to continue current medical treatment  - diet, wt control

## 2024-11-14 NOTE — Assessment & Plan Note (Signed)
 BP Readings from Last 3 Encounters:  11/14/24 122/80  09/13/24 118/84  09/05/24 120/70   Stable, pt to continue medical treatment losartan  100 mg qd

## 2024-11-14 NOTE — Progress Notes (Signed)
 Patient ID: [name], female   DOB: [birth year], 58 y.o.   MRN: [id number]        Chief Complaint: follow up left lateral foot pain, constipation, low vit d, hyperglycemia, htn, hld       HPI:  [name] is a 58 y.o. female here with currently working on her feet in steel toed boots, and now with 2 mo onset left lateral foot pain, red, swelling worse to walk fast at work, seemed worse after starting new boots that do not fit as well.  No trauma, fever, falls.  Pt denies chest pain, increased sob or doe, wheezing, orthopnea, PND, increased LE swelling, palpitations, dizziness or syncope.   Pt denies polydipsia, polyuria, or new focal neuro s/s.    Pt denies fever, wt loss, night sweats, loss of appetite, or other constitutional symptoms  Denies worsening reflux, abd pain, dysphagia, n/v, or blood but has ongoing constipation, and admits though miralax  helps she does not take every day.         Wt Readings from Last 3 Encounters:  11/14/24 171 lb (77.6 kg)  09/13/24 169 lb (76.7 kg)  09/05/24 168 lb 6.4 oz (76.4 kg)   BP Readings from Last 3 Encounters:  11/14/24 122/80  09/13/24 118/84  09/05/24 120/70         Past Medical History:  Diagnosis Date   ALLERGIC RHINITIS    Anemia    ANXIETY    DEPRESSION    Encounter for chronic pain management 10/25/2020   Fibroid    HYPERLIPIDEMIA    diet controlled, no meds   Hypertension    Hypothyroidism    Lichen sclerosus of female genitalia 2018   Thyroid  carcinoma [facility])    Past Surgical History:  Procedure Laterality Date   ABDOMINAL HYSTERECTOMY N/A 01/01/2015   Procedure:  SUPRACERVICAL HYSTERECTOMY WITH BILATERAL SALPINGECTOMT  Surgeon: Bobie BRAVO Amundson de Charlynn BRAVO Cary, MD;  Location: [facility];  Service: Gynecology;  Laterality: N/A;   BILATERAL SALPINGECTOMY Bilateral 01/01/2015   Procedure: BILATERAL SALPINGECTOMY        ;  Surgeon: Bobie BRAVO Amundson de Charlynn BRAVO Cary, MD;  Location: [facility];  Service: Gynecology;   Laterality: Bilateral;   CESAREAN SECTION  2002   x 1   TOTAL THYROIDECTOMY  2009/2010   TUBAL LIGATION     WISDOM TOOTH EXTRACTION      reports that she quit smoking about 20 months ago. Her smoking use included cigarettes. She started smoking about 32 years ago. She has a 15.5 pack-year smoking history. She uses smokeless tobacco. She reports current alcohol use of about 6.0 standard drinks of alcohol per week. She reports that she does not use drugs. family history includes Breast cancer in her paternal grandmother; Breast cancer (age of onset: 71) in her mother; COPD in her father; Cancer in her maternal grandmother; Colon polyps in her father; Diabetes in her father; Heart failure in her father; Hypertension in an other family member; Kidney disease in her father; Stroke in an other family member. Allergies[1] Medications Ordered Prior to Encounter[2]      ROS:  All others reviewed and negative.  Objective        PE:  BP 122/80 (BP Location: Left Arm, Patient Position: Sitting, Cuff Size: Normal)   Pulse 75   Temp 97.8 F (36.6 C) (Oral)   Ht 5' 5.5 (1.664 m)   Wt 171 lb (77.6 kg)   LMP 12/05/2014   SpO2  97%   BMI 28.02 kg/m                 Constitutional: Pt appears in NAD               HENT: Head: NCAT.                Right Ear: External ear normal.                 Left Ear: External ear normal.                Eyes: . Pupils are equal, round, and reactive to light. Conjunctivae and EOM are normal               Nose: without d/c or deformity               Neck: Neck supple. Gross normal ROM               Cardiovascular: Normal rate and regular rhythm.                 Pulmonary/Chest: Effort normal and breath sounds without rales or wheezing.                Abd:  Soft, NT, ND, + BS, no organomegaly               Neurological: Pt is alert. At baseline orientation, motor grossly intact               Skin: Skin is warm. No rashes, no other new lesions, LE edema - none, left  foot with mild red, tender, swelling at the lateral tibialis tendon, o/w neurovasc intact               Psychiatric: Pt behavior is normal without agitation   Micro: none  Cardiac tracings I have personally interpreted today:  none  Pertinent Radiological findings (summarize): none   Lab Results  Component Value Date   WBC 8.1 02/29/2024   HGB 14.1 02/29/2024   HCT 41.7 02/29/2024   PLT 353.0 02/29/2024   GLUCOSE 87 02/29/2024   CHOL 125 02/29/2024   TRIG 72.0 02/29/2024   HDL 55.60 02/29/2024   LDLDIRECT 131.0 03/10/2007   LDLCALC 55 02/29/2024   ALT 18 02/29/2024   AST 20 02/29/2024   NA 136 02/29/2024   K 3.9 02/29/2024   CL 100 02/29/2024   CREATININE 0.66 02/29/2024   BUN 14 02/29/2024   CO2 29 02/29/2024   TSH 0.41 09/05/2024   HGBA1C 6.0 02/29/2024   Assessment/Plan:  [name] is a 58 y.o. White or Caucasian [1] female with  has a past medical history of ALLERGIC RHINITIS, Anemia, ANXIETY, DEPRESSION, Encounter for chronic pain management (10/25/2020), Fibroid, HYPERLIPIDEMIA, Hypertension, Hypothyroidism, Lichen sclerosus of female genitalia (2018), and Thyroid  carcinoma (HCC).  Vitamin D  deficiency Last vitamin D  Lab Results  Component Value Date   VD25OH 45.47 02/29/2024   Stable, cont oral replacement   Hyperlipidemia Lab Results  Component Value Date   LDLCALC 55 02/29/2024   Stable, pt to continue current statin crestor  20 mg every day, zetia  10 mg qd   Hyperglycemia Lab Results  Component Value Date   HGBA1C 6.0 02/29/2024   Stable, pt to continue current medical treatment  - diet, wt control    HTN (hypertension) BP Readings from Last 3 Encounters:  11/14/24 122/80  09/13/24 118/84  09/05/24 120/70   Stable, pt to continue medical  treatment losartan  100 mg qd   Left foot pain C/w likely tendonitis possibly related to current steel toed shoes at work; pt encouraged for boots change, advil  pan, left foot xray, and consider  sport med referral if not improved  Chronic constipation Recurring uncontrolled, pt to take OTC Miralax  daily   Followup: Return in about 6 months (around 05/15/2025).  Lynwood Rush, MD 11/14/2024 12:30 PM [facility] Internal Medicine     [1]  Allergies Allergen Reactions   Doxycycline  Nausea Only   Sulfonamide Derivatives Itching  [2]  Current Outpatient Medications on File Prior to Visit  Medication Sig Dispense Refill   Ascorbic Acid (VITAMIN C) 1000 MG tablet Take 1,000 mg by mouth daily.     betamethasone  valerate ointment (VALISONE ) 0.1 % Apply to vulva twice daily for 2 weeks prn. May use twice weekly for maintenance dose. 45 g 2   BIOTIN PO Take 1 tablet by mouth daily.      cetirizine  (ZYRTEC ) 10 MG tablet Take 1 tablet (10 mg total) by mouth daily. 30 tablet 11   Cholecalciferol (VITAMIN D -3 PO) Take 1 capsule by mouth daily.     cyanocobalamin  1000 MCG tablet Take 1,000 mcg by mouth daily.     Multiple Vitamin (MULTIVITAMIN) capsule Take 1 capsule by mouth daily.     Omega-3 Fatty Acids (FISH OIL PO) Take 1 capsule by mouth daily.      polyethylene glycol (MIRALAX ) 17 g packet Take 34 g by mouth 2 (two) times daily. You may increase as needed 14 each 0   No current facility-administered medications on file prior to visit.

## 2024-11-14 NOTE — Progress Notes (Signed)
 The test results show that your current treatment is OK, as the tests are stable.  Please continue the same plan.  There is no other need for change of treatment or further evaluation based on these results, at this time.  thanks

## 2024-11-14 NOTE — Assessment & Plan Note (Signed)
 Lab Results  Component Value Date   LDLCALC 55 02/29/2024   Stable, pt to continue current statin crestor 20 mg every day, zetia 10 mg qd

## 2024-11-14 NOTE — Assessment & Plan Note (Signed)
 Last vitamin D Lab Results  Component Value Date   VD25OH 45.47 02/29/2024   Stable, cont oral replacement

## 2024-11-14 NOTE — Assessment & Plan Note (Signed)
 Recurring uncontrolled, pt to take OTC Miralax  daily

## 2024-11-14 NOTE — Assessment & Plan Note (Signed)
 C/w likely tendonitis possibly related to current steel toed shoes at work; pt encouraged for boots change, advil  pan, left foot xray, and consider sport med referral if not improved

## 2024-11-14 NOTE — Patient Instructions (Signed)
 Ok to take the OTC ibuprofen  as needed for pain, as well as new steel toed boots  Please continue all other medications as before, and refills have been done if requested.  Please have the pharmacy call with any other refills you may need.  Please continue your efforts at being more active, low cholesterol diet, and weight control.  You are otherwise up to date with prevention measures today.  Please keep your appointments with your specialists as you may have planned  Please go to the XRAY Department in the first floor for the x-ray testing  Please go to the LAB at the blood drawing area for the tests to be done  You will be contacted by phone if any changes need to be made immediately.  Otherwise, you will receive a letter about your results with an explanation, but please check with MyChart first.  Please make an Appointment to return in 6 months, or sooner if needed

## 2024-12-13 ENCOUNTER — Ambulatory Visit: Admitting: Obstetrics and Gynecology

## 2025-03-01 ENCOUNTER — Encounter: Admitting: Nurse Practitioner

## 2025-03-01 ENCOUNTER — Encounter: Admitting: Internal Medicine

## 2025-09-06 ENCOUNTER — Ambulatory Visit: Admitting: Internal Medicine

## 2025-09-17 ENCOUNTER — Ambulatory Visit: Admitting: Obstetrics and Gynecology
